# Patient Record
Sex: Male | Born: 1935 | Race: White | Hispanic: No | Marital: Married | Smoking: Never smoker
Health system: Southern US, Community
[De-identification: ages and names within clinical notes are randomized; demographics above are authoritative.]

## PROBLEM LIST (undated history)

## (undated) DIAGNOSIS — K219 Gastro-esophageal reflux disease without esophagitis: Secondary | ICD-10-CM

## (undated) DIAGNOSIS — M1990 Unspecified osteoarthritis, unspecified site: Secondary | ICD-10-CM

## (undated) DIAGNOSIS — N1330 Unspecified hydronephrosis: Secondary | ICD-10-CM

## (undated) DIAGNOSIS — R32 Unspecified urinary incontinence: Secondary | ICD-10-CM

## (undated) DIAGNOSIS — I10 Essential (primary) hypertension: Secondary | ICD-10-CM

## (undated) DIAGNOSIS — Z8619 Personal history of other infectious and parasitic diseases: Secondary | ICD-10-CM

## (undated) DIAGNOSIS — H9190 Unspecified hearing loss, unspecified ear: Secondary | ICD-10-CM

## (undated) HISTORY — PX: PROSTATE SURGERY: SHX751

## (undated) HISTORY — PX: TONSILLECTOMY: SUR1361

---

## 1957-11-12 HISTORY — PX: NOSE SURGERY: SHX723

## 2002-11-12 HISTORY — PX: PROSTATECTOMY: SHX69

## 2005-08-27 ENCOUNTER — Ambulatory Visit: Payer: Self-pay | Admitting: Internal Medicine

## 2007-05-28 ENCOUNTER — Ambulatory Visit: Payer: Self-pay | Admitting: Unknown Physician Specialty

## 2010-03-02 ENCOUNTER — Inpatient Hospital Stay: Payer: Self-pay | Admitting: Urology

## 2010-05-16 ENCOUNTER — Ambulatory Visit: Payer: Self-pay | Admitting: Urology

## 2012-08-29 ENCOUNTER — Ambulatory Visit: Payer: Self-pay | Admitting: Internal Medicine

## 2012-10-17 ENCOUNTER — Encounter (HOSPITAL_COMMUNITY): Payer: Self-pay | Admitting: Pharmacy Technician

## 2012-10-17 ENCOUNTER — Other Ambulatory Visit: Payer: Self-pay | Admitting: Urology

## 2012-10-20 ENCOUNTER — Encounter (HOSPITAL_COMMUNITY): Payer: Self-pay

## 2012-10-20 ENCOUNTER — Ambulatory Visit (HOSPITAL_COMMUNITY)
Admission: RE | Admit: 2012-10-20 | Discharge: 2012-10-20 | Disposition: A | Discharge status: Home / Self Care | Payer: Medicare Other | Source: Ambulatory Visit | Attending: Urology | Admitting: Urology

## 2012-10-20 ENCOUNTER — Encounter (HOSPITAL_COMMUNITY)
Admission: RE | Admit: 2012-10-20 | Discharge: 2012-10-20 | Disposition: A | Discharge status: Home / Self Care | Payer: Medicare Other | Source: Ambulatory Visit | Attending: Urology | Admitting: Urology

## 2012-10-20 DIAGNOSIS — K219 Gastro-esophageal reflux disease without esophagitis: Secondary | ICD-10-CM | POA: Insufficient documentation

## 2012-10-20 DIAGNOSIS — N1330 Unspecified hydronephrosis: Secondary | ICD-10-CM | POA: Insufficient documentation

## 2012-10-20 DIAGNOSIS — Z01812 Encounter for preprocedural laboratory examination: Secondary | ICD-10-CM | POA: Insufficient documentation

## 2012-10-20 DIAGNOSIS — I10 Essential (primary) hypertension: Secondary | ICD-10-CM | POA: Insufficient documentation

## 2012-10-20 DIAGNOSIS — Z01810 Encounter for preprocedural cardiovascular examination: Secondary | ICD-10-CM | POA: Insufficient documentation

## 2012-10-20 HISTORY — DX: Unspecified urinary incontinence: R32

## 2012-10-20 HISTORY — DX: Unspecified osteoarthritis, unspecified site: M19.90

## 2012-10-20 HISTORY — DX: Essential (primary) hypertension: I10

## 2012-10-20 HISTORY — DX: Unspecified hydronephrosis: N13.30

## 2012-10-20 HISTORY — DX: Personal history of other infectious and parasitic diseases: Z86.19

## 2012-10-20 HISTORY — DX: Unspecified hearing loss, unspecified ear: H91.90

## 2012-10-20 HISTORY — DX: Gastro-esophageal reflux disease without esophagitis: K21.9

## 2012-10-20 LAB — SURGICAL PCR SCREEN: MRSA, PCR: NEGATIVE

## 2012-10-20 NOTE — Progress Notes (Signed)
10/20/12 1301  OBSTRUCTIVE SLEEP APNEA  Have you ever been diagnosed with sleep apnea through a sleep study? No  Do you snore loudly (loud enough to be heard through closed doors)?  1  Do you often feel tired, fatigued, or sleepy during the daytime? 1  Has anyone observed you stop breathing during your sleep? 0  Do you have, or are you being treated for high blood pressure? 1  BMI more than 35 kg/m2? 0  Age over 76 years old? 1  Neck circumference greater than 40 cm/18 inches? 1  Gender: 1  Obstructive Sleep Apnea Score 6   Score 4 or greater  Results sent to PCP

## 2012-10-20 NOTE — Patient Instructions (Addendum)
Jeffrey Dennis  10/20/2012                           YOUR PROCEDURE IS SCHEDULED ON: 10/21/12               PLEASE REPORT TO SHORT STAY CENTER AT : 9:15 AM               CALL THIS NUMBER IF ANY PROBLEMS THE DAY OF SURGERY :               [phone]                      REMEMBER:   Do not eat food or drink liquids AFTER MIDNIGHT   Take these medicines the morning of surgery with A SIP OF WATER:  LABETOLOL / BACTRIM / TRAMADOL / RAPAFLO   Do not wear jewelry, make-up   Do not wear lotions, powders, or perfumes.   Do not shave legs or underarms 12 hrs. before surgery (men may shave face)  Do not bring valuables to the hospital.  Contacts, dentures or bridgework may not be worn into surgery.  Leave suitcase in the car. After surgery it may be brought to your room.  For patients admitted to the hospital more than one night, checkout time is 11:00                          The day of discharge.   Patients discharged the day of surgery will not be allowed to drive home                             If going home same day of surgery, must have someone stay with you first                           24 hrs at home and arrange for some one to drive you home from hospital.    Special Instructions:   Please read over the following fact sheets that you were given:               1. MRSA  INFORMATION                      2. [place] PREPARING FOR SURGERY SHEET                                                X_____________________________________________________________________                  FAILURE TO FOLLOW THESE INSTRUCTIONS MAY RESULT IN CANCELLATION OF SURGERY

## 2012-10-20 NOTE — H&P (Signed)
ctive Problems Problems  1. Benign Prostatic Hypertrophy With Urinary Obstruction 600.01 2. Chronic Renal Insufficiency 585.9 3. Hydronephrosis On The Left 591 4. Hydronephrosis On The Right 591 5. Incomplete Emptying Of Bladder 788.21 6. PSA,Elevated 790.93 7. Ureteral Stricture 593.3 8. Urinary Incontinence With Continuous Leakage 788.37 9. Urinary Tract Infection 599.0  History of Present Illness     [name] is an 78 yo WM sent in consultation by Dr. Bethann Punches for a possible bladder mass and bilateral hydronephrosis on a renal US on 08/29/12.  He had a bladder volume of over 1000cc.  He has a history of a distal urethral stricture and had that dilated by Dr. Lonna Cobb in 2011. He has been on Rapaflo and was given a catheter to try self dilation but hasn't done that in a while.  He had a UTI on 11/20 that has been treated with Keflex and his UA has improved.  He has seen Dr. Payton Mccallum in the past after he was found to have an elevated PSA to 7.4.  He had a negative biopsy by Dr. Artis Flock and then he had a saturation biopsy because of a PSA of 25 and that was negative in 2003-2004.  He subsequently had bleeding and had a TURP.  His repeat PSA's have been better.  He was told he had the largest prostate they had ever seen at [facility].  He has an abdominal US done in October because of recurrent UTI's. He has mild frequent incontinence that is worse with stress.  He is wearing 5 ppd.  He has no urgency and minimal urgency.  He has nocturia x 4.  He has a weak stream.  He is not sure if he empties well but has no excessive pressure. His Cr on 10/16 was mildly elevated at 1.9.   Past Medical History Problems  1. History of  Arthritis V13.4 2. History of  Gout 274.9 3. History of  Hepatitis 573.3 4. History of  Hypertension 401.9  Surgical History Problems  1. History of  Biopsy Of The Prostate Needle 2. History of  Transurethral Resection Of Prostate (TURP)  Current Meds 1.  Labetalol HCl 300 MG Oral Tablet; Therapy: (Recorded:05Dec2013) to 2. Rapaflo 8 MG Oral Capsule; Therapy: (Recorded:05Dec2013) to 3. TraMADol HCl 50 MG Oral Tablet; Therapy: (Recorded:05Dec2013) to  Allergies Medication  1. No Known Drug Allergies  Family History Problems  1. Family history of  Death In The Family Father 2. Family history of  Death In The Family Mother 3. Family history of  Family Health Status Number Of Children 4. Paternal history of  Stroke Syndrome V17.1 5. Maternal history of  Stroke Syndrome V17.1  Social History Problems    Marital History - Currently Married   Never A Smoker   Occupation:   Retired From Work Denied    History of  Alcohol Use   History of  Caffeine Use  Review of Systems Genitourinary, constitutional, skin, eye, otolaryngeal, hematologic/lymphatic, cardiovascular, pulmonary, endocrine, musculoskeletal, gastrointestinal, neurological and psychiatric system(s) were reviewed and pertinent findings if present are noted.  Genitourinary: urinary frequency, feelings of urinary urgency, nocturia, incontinence, difficulty starting the urinary stream, weak urinary stream and urinary stream starts and stops.  Gastrointestinal: diarrhea and constipation.  Constitutional: no fever.  Eyes: diplopia.  ENT: sinus problems.  Musculoskeletal: joint pain.    Vitals Vital Signs [Data Includes: Last 1 Day]  05Dec2013 02:35PM  Weight: 232 lb  Blood Pressure: 143 / 76 Temperature: 99 F Heart Rate:  77  Physical Exam Constitutional: Well nourished and well developed . No acute distress.  ENT:. The ears and nose are normal in appearance.  Neck: The appearance of the neck is normal and no neck mass is present.  Pulmonary: No respiratory distress and normal respiratory rhythm and effort.  Cardiovascular: Heart rate and rhythm are normal . No peripheral edema.  Abdomen: The abdomen is mildly obese. The abdomen is soft and nontender. No masses are  palpated. No CVA tenderness. No hernias are palpable. No hepatosplenomegaly noted.  Rectal: Rectal exam demonstrates normal sphincter tone, no tenderness and no masses. Estimated prostate size is 4+. The prostate has no nodularity and is not tender. The left seminal vesicle is nonpalpable. The right seminal vesicle is nonpalpable. The perineum is normal on inspection.  Genitourinary: Examination of the penis demonstrates meatal stenosis, but no discharge, no masses and no lesions. The penis is uncircumcised. The scrotum is without lesions. The right epididymis is palpably normal and non-tender. The left epididymis is palpably normal and non-tender. The right testis is non-tender and without masses. The left testis is non-tender and without masses.  Lymphatics: The femoral and inguinal nodes are not enlarged or tender.  Skin: Normal skin turgor, no visible rash and no visible skin lesions.  Neuro/Psych:. Mood and affect are appropriate.    Results/Data Urine [Data Includes: Last 1 Day]   05Dec2013  COLOR YELLOW   APPEARANCE CLOUDY   SPECIFIC GRAVITY 1.010   pH 6.0   GLUCOSE NEG mg/dL  BILIRUBIN NEG   KETONE NEG mg/dL  BLOOD MOD   PROTEIN NEG mg/dL  UROBILINOGEN 0.2 mg/dL  NITRITE POS   LEUKOCYTE ESTERASE MOD   SQUAMOUS EPITHELIAL/HPF NONE SEEN   WBC 21-50 WBC/hpf  RBC 0-2 RBC/hpf  BACTERIA RARE   CRYSTALS NONE SEEN   CASTS NONE SEEN    Old records or history reviewed: I have reviewed records from Dr. Hyacinth Meeker with the UA and renal US.  The following images/tracing/specimen were independently visualized:  Renal US today shows an 11.26cm right kidney with moderate to severe hydro and hydroureter. No stones or masses are noted. The left kidney is 11.27cm with moderate hydro and hydroureter. There are no stones or mass lesions. The bladder volume is 953cc with a rounded mass at the base of the bladder consistent with an intravesical prostatic middle lobe.  The following clinical lab reports  were reviewed:  Urine from 11/20 reviewed.  The following radiology reports were reviewed: Renal US report from 10/18 reviewed.    Assessment Assessed  1. Benign Prostatic Hypertrophy With Urinary Obstruction 600.01 2. Hydronephrosis On The Left 591 3. Hydronephrosis On The Right 591 4. Incomplete Emptying Of Bladder 788.21 5. Ureteral Stricture 593.3 6. Chronic Renal Insufficiency 585.9 7. Urinary Tract Infection 599.0 8. Urinary Incontinence With Continuous Leakage 788.37   He has recurrent UTI's with overflow incontinence and bilateral hydro with renal insufficiency from outlet obstruction either from his prostate or urethral stricture.   Plan  Hydronephrosis On The Left (591), Hydronephrosis On The Right (591)  1. RENAL U/S COMPLETE  Done: 05Dec2013 12:00AM Incomplete Emptying Of Bladder (788.21)  2. Get Outside Records Office  Follow-up  Requested for: 05Dec2013 Urinary Tract Infection (599.0)  3. BASIC METABOLIC PANEL  Done: 05Dec2013 4. CBC W/DIFF  Done: 05Dec2013      I am going to culture the urine today and check a BMP and CBC. He will start the sulfa antibiotic he was given by Dr. Hyacinth Meeker. I am  going to schedule him for next week for cystoscopy with probable urethral balloon dilation, bilateral retrograde pyelograms and possible TURP. I have reviewed the risks of bleeding, infection, recurrent stricture, worsening incontinence, need for additional procedures or prolonged foley drainage, thrombotic events and anesthetic complications  I am going to get his surgical records from [facility].   Discussion/Summary  CC: Dr. Bethann Punches.

## 2012-10-21 ENCOUNTER — Encounter (HOSPITAL_COMMUNITY)
Admission: RE | Disposition: A | Discharge status: Home Health | Payer: Self-pay | Source: Ambulatory Visit | Attending: Urology

## 2012-10-21 ENCOUNTER — Inpatient Hospital Stay (HOSPITAL_COMMUNITY)
Admission: RE | Admit: 2012-10-21 | Discharge: 2012-10-25 | DRG: 713 | Disposition: A | Discharge status: Home Health | Payer: Medicare Other | Source: Ambulatory Visit | Attending: Urology | Admitting: Urology

## 2012-10-21 ENCOUNTER — Encounter (HOSPITAL_COMMUNITY): Payer: Self-pay | Admitting: *Deleted

## 2012-10-21 ENCOUNTER — Ambulatory Visit (HOSPITAL_COMMUNITY): Payer: Medicare Other | Admitting: Anesthesiology

## 2012-10-21 ENCOUNTER — Encounter (HOSPITAL_COMMUNITY): Payer: Self-pay | Admitting: Anesthesiology

## 2012-10-21 DIAGNOSIS — D72829 Elevated white blood cell count, unspecified: Secondary | ICD-10-CM

## 2012-10-21 DIAGNOSIS — R339 Retention of urine, unspecified: Secondary | ICD-10-CM | POA: Diagnosis present

## 2012-10-21 DIAGNOSIS — N39490 Overflow incontinence: Secondary | ICD-10-CM | POA: Diagnosis present

## 2012-10-21 DIAGNOSIS — N390 Urinary tract infection, site not specified: Secondary | ICD-10-CM | POA: Diagnosis present

## 2012-10-21 DIAGNOSIS — N1330 Unspecified hydronephrosis: Secondary | ICD-10-CM | POA: Diagnosis present

## 2012-10-21 DIAGNOSIS — G9340 Encephalopathy, unspecified: Secondary | ICD-10-CM

## 2012-10-21 DIAGNOSIS — I129 Hypertensive chronic kidney disease with stage 1 through stage 4 chronic kidney disease, or unspecified chronic kidney disease: Secondary | ICD-10-CM | POA: Diagnosis present

## 2012-10-21 DIAGNOSIS — N138 Other obstructive and reflux uropathy: Principal | ICD-10-CM | POA: Diagnosis present

## 2012-10-21 DIAGNOSIS — IMO0002 Reserved for concepts with insufficient information to code with codable children: Secondary | ICD-10-CM | POA: Diagnosis present

## 2012-10-21 DIAGNOSIS — N179 Acute kidney failure, unspecified: Secondary | ICD-10-CM

## 2012-10-21 DIAGNOSIS — K219 Gastro-esophageal reflux disease without esophagitis: Secondary | ICD-10-CM | POA: Diagnosis present

## 2012-10-21 DIAGNOSIS — K209 Esophagitis, unspecified without bleeding: Secondary | ICD-10-CM | POA: Diagnosis present

## 2012-10-21 DIAGNOSIS — E871 Hypo-osmolality and hyponatremia: Secondary | ICD-10-CM | POA: Diagnosis present

## 2012-10-21 DIAGNOSIS — G9349 Other encephalopathy: Secondary | ICD-10-CM | POA: Diagnosis not present

## 2012-10-21 DIAGNOSIS — N189 Chronic kidney disease, unspecified: Secondary | ICD-10-CM | POA: Diagnosis present

## 2012-10-21 DIAGNOSIS — N401 Enlarged prostate with lower urinary tract symptoms: Principal | ICD-10-CM | POA: Diagnosis present

## 2012-10-21 DIAGNOSIS — K2970 Gastritis, unspecified, without bleeding: Secondary | ICD-10-CM

## 2012-10-21 DIAGNOSIS — K2990 Gastroduodenitis, unspecified, without bleeding: Secondary | ICD-10-CM | POA: Diagnosis present

## 2012-10-21 DIAGNOSIS — N39498 Other specified urinary incontinence: Secondary | ICD-10-CM | POA: Diagnosis present

## 2012-10-21 HISTORY — PX: TRANSURETHRAL RESECTION OF PROSTATE: SHX73

## 2012-10-21 HISTORY — PX: CYSTOSCOPY W/ RETROGRADES: SHX1426

## 2012-10-21 SURGERY — CYSTOSCOPY, WITH RETROGRADE PYELOGRAM
Anesthesia: General | Wound class: Clean Contaminated

## 2012-10-21 MED ORDER — ONDANSETRON HCL 4 MG/2ML IJ SOLN
4.0000 mg | INTRAMUSCULAR | Status: DC | PRN
  Administered 2012-10-23: 4 mg via INTRAVENOUS
  Filled 2012-10-21: qty 2

## 2012-10-21 MED ORDER — ONDANSETRON HCL 4 MG/2ML IJ SOLN
INTRAMUSCULAR | Status: DC | PRN
  Administered 2012-10-21 (×2): 2 mg via INTRAVENOUS

## 2012-10-21 MED ORDER — METOCLOPRAMIDE HCL 5 MG/ML IJ SOLN
INTRAMUSCULAR | Status: DC | PRN
  Administered 2012-10-21: 5 mg via INTRAVENOUS

## 2012-10-21 MED ORDER — SULFAMETHOXAZOLE-TMP DS 800-160 MG PO TABS
1.0000 | ORAL_TABLET | Freq: Two times a day (BID) | ORAL | Status: DC
  Administered 2012-10-21 – 2012-10-23 (×4): 1 via ORAL
  Filled 2012-10-21 (×5): qty 1

## 2012-10-21 MED ORDER — EPHEDRINE SULFATE 50 MG/ML IJ SOLN
INTRAMUSCULAR | Status: DC | PRN
  Administered 2012-10-21: 10 mg via INTRAVENOUS

## 2012-10-21 MED ORDER — LACTATED RINGERS IV SOLN
INTRAVENOUS | Status: DC | PRN
  Administered 2012-10-21 (×2): via INTRAVENOUS

## 2012-10-21 MED ORDER — HYDROMORPHONE HCL PF 1 MG/ML IJ SOLN: 0.5000 mg | INTRAMUSCULAR | Status: DC | PRN

## 2012-10-21 MED ORDER — ACETAMINOPHEN 10 MG/ML IV SOLN
INTRAVENOUS | Status: AC
  Filled 2012-10-21: qty 100

## 2012-10-21 MED ORDER — FENTANYL CITRATE 0.05 MG/ML IJ SOLN
INTRAMUSCULAR | Status: AC
  Filled 2012-10-21: qty 2

## 2012-10-21 MED ORDER — BELLADONNA ALKALOIDS-OPIUM 16.2-60 MG RE SUPP
1.0000 | Freq: Four times a day (QID) | RECTAL | Status: DC | PRN
  Administered 2012-10-21 – 2012-10-22 (×2): 1 via RECTAL
  Filled 2012-10-21 (×2): qty 1

## 2012-10-21 MED ORDER — PHENYLEPHRINE HCL 10 MG/ML IJ SOLN
INTRAMUSCULAR | Status: DC | PRN
  Administered 2012-10-21 (×3): 40 ug via INTRAVENOUS
  Administered 2012-10-21: 20 ug via INTRAVENOUS

## 2012-10-21 MED ORDER — LACTATED RINGERS IV SOLN: INTRAVENOUS | Status: DC

## 2012-10-21 MED ORDER — BELLADONNA ALKALOIDS-OPIUM 16.2-60 MG RE SUPP
RECTAL | Status: AC
  Filled 2012-10-21: qty 1

## 2012-10-21 MED ORDER — MIDAZOLAM HCL 5 MG/5ML IJ SOLN
INTRAMUSCULAR | Status: DC | PRN
  Administered 2012-10-21 (×2): 0.5 mg via INTRAVENOUS

## 2012-10-21 MED ORDER — PROPOFOL 10 MG/ML IV EMUL
INTRAVENOUS | Status: DC | PRN
  Administered 2012-10-21: 175 mg via INTRAVENOUS

## 2012-10-21 MED ORDER — IOHEXOL 300 MG/ML  SOLN
INTRAMUSCULAR | Status: DC | PRN
  Administered 2012-10-21: 28 mL

## 2012-10-21 MED ORDER — CEFAZOLIN SODIUM-DEXTROSE 2-3 GM-% IV SOLR
2.0000 g | INTRAVENOUS | Status: AC
  Administered 2012-10-21: 2 g via INTRAVENOUS

## 2012-10-21 MED ORDER — ACETAMINOPHEN 325 MG PO TABS: 650.0000 mg | ORAL_TABLET | ORAL | Status: DC | PRN

## 2012-10-21 MED ORDER — FENTANYL CITRATE 0.05 MG/ML IJ SOLN
INTRAMUSCULAR | Status: DC | PRN
  Administered 2012-10-21 (×3): 50 ug via INTRAVENOUS

## 2012-10-21 MED ORDER — LABETALOL HCL 300 MG PO TABS
150.0000 mg | ORAL_TABLET | Freq: Two times a day (BID) | ORAL | Status: DC
  Administered 2012-10-21 – 2012-10-25 (×8): 150 mg via ORAL
  Filled 2012-10-21 (×9): qty 0.5

## 2012-10-21 MED ORDER — ZOLPIDEM TARTRATE 5 MG PO TABS: 5.0000 mg | ORAL_TABLET | Freq: Every evening | ORAL | Status: DC | PRN

## 2012-10-21 MED ORDER — CEFAZOLIN SODIUM-DEXTROSE 2-3 GM-% IV SOLR
INTRAVENOUS | Status: AC
  Filled 2012-10-21: qty 50

## 2012-10-21 MED ORDER — IOHEXOL 300 MG/ML  SOLN
INTRAMUSCULAR | Status: AC
  Filled 2012-10-21: qty 1

## 2012-10-21 MED ORDER — SODIUM CHLORIDE 0.9 % IR SOLN
Status: DC | PRN
  Administered 2012-10-21: 1000 mL

## 2012-10-21 MED ORDER — EPHEDRINE SULFATE 50 MG/ML IJ SOLN
INTRAMUSCULAR | Status: DC | PRN
  Administered 2012-10-21 (×2): 5 mg via INTRAVENOUS
  Administered 2012-10-21: 10 mg via INTRAVENOUS
  Administered 2012-10-21 (×3): 5 mg via INTRAVENOUS
  Administered 2012-10-21: 10 mg via INTRAVENOUS
  Administered 2012-10-21: 5 mg via INTRAVENOUS
  Administered 2012-10-21: 10 mg via INTRAVENOUS

## 2012-10-21 MED ORDER — BISACODYL 10 MG RE SUPP: 10.0000 mg | Freq: Every day | RECTAL | Status: DC | PRN

## 2012-10-21 MED ORDER — KCL IN DEXTROSE-NACL 20-5-0.45 MEQ/L-%-% IV SOLN
INTRAVENOUS | Status: DC
  Administered 2012-10-21: 100 mL via INTRAVENOUS
  Administered 2012-10-22: 02:00:00 via INTRAVENOUS
  Filled 2012-10-21 (×4): qty 1000

## 2012-10-21 MED ORDER — BELLADONNA ALKALOIDS-OPIUM 16.2-60 MG RE SUPP
RECTAL | Status: DC | PRN
  Administered 2012-10-21: 1 via RECTAL

## 2012-10-21 MED ORDER — TRAMADOL HCL 50 MG PO TABS
50.0000 mg | ORAL_TABLET | Freq: Four times a day (QID) | ORAL | Status: DC | PRN
  Administered 2012-10-22: 50 mg via ORAL
  Filled 2012-10-21: qty 1

## 2012-10-21 MED ORDER — SODIUM CHLORIDE 0.9 % IR SOLN
3000.0000 mL | Status: DC
  Administered 2012-10-21 – 2012-10-22 (×2): 3000 mL

## 2012-10-21 MED ORDER — LACTATED RINGERS IV SOLN
INTRAVENOUS | Status: DC
  Administered 2012-10-21: 1000 mL via INTRAVENOUS

## 2012-10-21 MED ORDER — DEXTROSE 5 % IV SOLN
INTRAVENOUS | Status: DC | PRN
  Administered 2012-10-21: 12:00:00 via INTRAVENOUS

## 2012-10-21 MED ORDER — FENTANYL CITRATE 0.05 MG/ML IJ SOLN
25.0000 ug | INTRAMUSCULAR | Status: DC | PRN
  Administered 2012-10-21 (×2): 50 ug via INTRAVENOUS

## 2012-10-21 MED ORDER — SODIUM CHLORIDE 0.9 % IR SOLN
Status: DC | PRN
  Administered 2012-10-21: 24000 mL via INTRAVESICAL

## 2012-10-21 MED ORDER — LIDOCAINE HCL (CARDIAC) 20 MG/ML IV SOLN
INTRAVENOUS | Status: DC | PRN
  Administered 2012-10-21: 50 mg via INTRAVENOUS

## 2012-10-21 MED ORDER — DOCUSATE SODIUM 100 MG PO CAPS
100.0000 mg | ORAL_CAPSULE | Freq: Two times a day (BID) | ORAL | Status: DC
  Administered 2012-10-21 – 2012-10-25 (×8): 100 mg via ORAL
  Filled 2012-10-21 (×9): qty 1

## 2012-10-21 MED ORDER — PRAMIPEXOLE DIHYDROCHLORIDE 0.25 MG PO TABS
0.2500 mg | ORAL_TABLET | Freq: Every day | ORAL | Status: DC
  Administered 2012-10-21 – 2012-10-22 (×2): 0.25 mg via ORAL
  Filled 2012-10-21 (×3): qty 1

## 2012-10-21 MED ORDER — DEXTROSE 5 % IV SOLN
430.0000 mg | INTRAVENOUS | Status: AC
  Administered 2012-10-21: 430 mg via INTRAVENOUS
  Filled 2012-10-21: qty 10.75

## 2012-10-21 SURGICAL SUPPLY — 32 items
BAG URINE DRAINAGE (UROLOGICAL SUPPLIES) ×3 IMPLANT
BAG URO CATCHER STRL LF (DRAPE) ×3 IMPLANT
BALLN NEPHROSTOMY (BALLOONS) ×3
BALLOON NEPHROSTOMY (BALLOONS) ×2 IMPLANT
BLADE SURG 15 STRL LF DISP TIS (BLADE) IMPLANT
BLADE SURG 15 STRL SS (BLADE)
CATH FOLEY 3WAY 30CC 22FR (CATHETERS) ×3 IMPLANT
CATH URET 5FR 28IN OPEN ENDED (CATHETERS) IMPLANT
CLOTH BEACON ORANGE TIMEOUT ST (SAFETY) ×3 IMPLANT
DRAPE CAMERA CLOSED 9X96 (DRAPES) ×3 IMPLANT
ELECT BUTTON HF 24-28F 2 30DE (ELECTRODE) ×3 IMPLANT
ELECT LOOP MED HF 24F 12D CBL (CLIP) ×3 IMPLANT
ELECT REM PT RETURN 9FT ADLT (ELECTROSURGICAL)
ELECTRODE KNIFE URO 27FR PED (UROLOGICAL SUPPLIES) IMPLANT
ELECTRODE REM PT RTRN 9FT ADLT (ELECTROSURGICAL) IMPLANT
GLOVE SURG SS PI 8.0 STRL IVOR (GLOVE) ×3 IMPLANT
GOWN BRE IMP PREV XXLGXLNG (GOWN DISPOSABLE) ×3 IMPLANT
GOWN PREVENTION PLUS XLARGE (GOWN DISPOSABLE) ×3 IMPLANT
GOWN STRL REIN XL XLG (GOWN DISPOSABLE) ×3 IMPLANT
GUIDEWIRE STR DUAL SENSOR (WIRE) ×3 IMPLANT
HOLDER FOLEY CATH W/STRAP (MISCELLANEOUS) ×3 IMPLANT
KIT ASPIRATION TUBING (SET/KITS/TRAYS/PACK) ×6 IMPLANT
KNIFE COLLINS 24FR (ELECTROSURGICAL) IMPLANT
LOOPS CUTTING 24FR (ELECTROSURGICAL) IMPLANT
LOOPS RESECTOSCOPE DISP (ELECTROSURGICAL) ×3 IMPLANT
MANIFOLD NEPTUNE II (INSTRUMENTS) ×3 IMPLANT
PACK CYSTO (CUSTOM PROCEDURE TRAY) ×3 IMPLANT
ROLLER BALL 3MM 27FR (ELECTROSURGICAL) IMPLANT
SUT ETHILON 3 0 PS 1 (SUTURE) IMPLANT
SYR 30ML LL (SYRINGE) IMPLANT
SYRINGE IRR TOOMEY STRL 70CC (SYRINGE) ×3 IMPLANT
TUBING CONNECTING 10 (TUBING) ×3 IMPLANT

## 2012-10-21 NOTE — Interval H&P Note (Signed)
History and Physical Interval Note:  10/21/2012 11:17 AM  Jeffrey Dennis  has presented today for surgery, with the diagnosis of bilateral hydronephrosis and retention   The various methods of treatment have been discussed with the patient and family. After consideration of risks, benefits and other options for treatment, the patient has consented to  Procedure(s) (LRB) with comments: CYSTOSCOPY WITH RETROGRADE PYELOGRAM (Bilateral) - cystoscopy bilateral retrograde possible ureteral dilatation   TRANSURETHRAL RESECTION OF THE PROSTATE (TURP) (N/A) - Possible TURP as a surgical intervention .  The patient's history has been reviewed, patient examined, no change in status, stable for surgery.  I have reviewed the patient's chart and labs.  Questions were answered to the patient's satisfaction.     Wendell Nicoson J

## 2012-10-21 NOTE — Preoperative (Signed)
Beta Blockers   Reason not to administer Beta Blockers:Not Applicable pt. Took beta blocker this a.m. 

## 2012-10-21 NOTE — Brief Op Note (Signed)
10/21/2012  1:15 PM  PATIENT:  Jeffrey Dennis  78 y.o. male  PRE-OPERATIVE DIAGNOSIS:  bilateral hydronephrosis and retention   POST-OPERATIVE DIAGNOSIS:  bilateral hydronephrosis and retention   PROCEDURE:  Procedure(s) (LRB) with comments: CYSTOSCOPY WITH RETROGRADE PYELOGRAM (Bilateral) - cystoscopy bilateral retrograde  ureteral dilatation   TRANSURETHRAL RESECTION OF THE PROSTATE (TURP) (N/A)  SURGEON:  Surgeon(s) and Role:    * Anner Crete, MD - Primary  PHYSICIAN ASSISTANT:   ASSISTANTS: none   ANESTHESIA:   general  EBL:  Total I/O In: 1150 [I.V.:1150] Out: -   BLOOD ADMINISTERED:none  DRAINS: Urinary Catheter (Foley)   LOCAL MEDICATIONS USED:  NONE  SPECIMEN:  Source of Specimen:  prostate chips  DISPOSITION OF SPECIMEN:  N/A  COUNTS:  YES  TOURNIQUET:  * No tourniquets in log *  DICTATION: .Other Dictation: Dictation Number [id number]  PLAN OF CARE: Admit for overnight observation  PATIENT DISPOSITION:  PACU - hemodynamically stable.   Delay start of Pharmacological VTE agent (>24hrs) due to surgical blood loss or risk of bleeding: yes

## 2012-10-21 NOTE — Transfer of Care (Signed)
Immediate Anesthesia Transfer of Care Note  Patient: Jeffrey Dennis  Procedure(s) Performed: Procedure(s) (LRB) with comments: CYSTOSCOPY WITH RETROGRADE PYELOGRAM (Bilateral) - cystoscopy bilateral retrograde  ureteral dilatation   TRANSURETHRAL RESECTION OF THE PROSTATE (TURP) (N/A)  Patient Location: PACU  Anesthesia Type:General  Level of Consciousness: awake, alert , oriented and patient cooperative  Airway & Oxygen Therapy: Patient Spontanous Breathing and Patient connected to face mask oxygen  Post-op Assessment: Report given to PACU RN, Post -op Vital signs reviewed and stable and Patient moving all extremities  Post vital signs: Reviewed and stable  Complications: No apparent anesthesia complications

## 2012-10-21 NOTE — Anesthesia Preprocedure Evaluation (Addendum)
Anesthesia Evaluation  Patient identified by MRN, date of birth, ID band Patient awake    Reviewed: Allergy & Precautions, H&P , NPO status , Patient's Chart, lab work & pertinent test results, reviewed documented beta blocker date and time   Airway Mallampati: II TM Distance: >3 FB Neck ROM: full    Dental No notable dental hx. (+) Teeth Intact and Dental Advisory Given   Pulmonary neg pulmonary ROS,  breath sounds clear to auscultation  Pulmonary exam normal       Cardiovascular Exercise Tolerance: Good hypertension, Pt. on medications and Pt. on home beta blockers Rhythm:regular Rate:Normal  RBBB   Neuro/Psych negative neurological ROS  negative psych ROS   GI/Hepatic negative GI ROS, Neg liver ROS, GERD-  Controlled,  Endo/Other  negative endocrine ROS  Renal/GU negative Renal ROS  negative genitourinary   Musculoskeletal   Abdominal   Peds  Hematology negative hematology ROS (+)   Anesthesia Other Findings   Reproductive/Obstetrics negative OB ROS                          Anesthesia Physical Anesthesia Plan  ASA: II  Anesthesia Plan: General   Post-op Pain Management:    Induction: Intravenous  Airway Management Planned: LMA  Additional Equipment:   Intra-op Plan:   Post-operative Plan:   Informed Consent: I have reviewed the patients History and Physical, chart, labs and discussed the procedure including the risks, benefits and alternatives for the proposed anesthesia with the patient or authorized representative who has indicated his/her understanding and acceptance.   Dental Advisory Given  Plan Discussed with: CRNA and Surgeon  Anesthesia Plan Comments:         Anesthesia Quick Evaluation

## 2012-10-21 NOTE — Progress Notes (Signed)
Day of Surgery Subjective: Patient reports pain control good.  He is resting comfortably and telling jokes.  Objective: Vital signs in last 24 hours: Temp:  [97.8 F (36.6 C)-98.7 F (37.1 C)] 97.8 F (36.6 C) (12/10 1328) Pulse Rate:  [63-69] 63  (12/10 1415) Resp:  [14-20] 16  (12/10 1415) BP: (126-138)/(71-80) 126/75 mmHg (12/10 1415) SpO2:  [98 %-100 %] 100 % (12/10 1415) Weight:  [105.688 kg (233 lb)-105.7 kg (233 lb 0.4 oz)] 105.7 kg (233 lb 0.4 oz) (12/10 0907)  Intake/Output from previous day:   Intake/Output this shift: Total I/O In: 5400 [I.V.:1400; Other:4000] Out: 850 [Urine:850]  Physical Exam:  General:alert, cooperative and no distress Breaths sounds clear and unlabored GI: soft, non tender Foley: red urine with no clots   Lab Results:  Results for orders placed during the hospital encounter of 10/20/12  SURGICAL PCR SCREEN     Status: Normal   Collection Time   10/20/12  1:29 PM      Component Value Range Status Comment   MRSA, PCR NEGATIVE  NEGATIVE Final    Staphylococcus aureus NEGATIVE  NEGATIVE Final     Studies/Results: Dg Chest 2 View  10/20/2012  *RADIOLOGY REPORT*  Clinical Data: Preoperative respiratory films.  CHEST - 2 VIEW  Comparison: None.  Findings: Lungs are clear.  Heart size is normal.  No pneumothorax or pleural fluid.  Thoracic degenerative change noted.  IMPRESSION: No acute finding.   Original Report Authenticated By: Holley Dexter, M.D.     Assessment/Plan: Day of Surgery Procedure(s) (LRB): CYSTOSCOPY WITH RETROGRADE PYELOGRAM (Bilateral) TRANSURETHRAL RESECTION OF THE PROSTATE (TURP) (N/A)  Continue to monitor  Ambulate  I.S   LOS: 0 days   Silas Flood. 10/21/2012, 2:28 PM

## 2012-10-21 NOTE — Anesthesia Postprocedure Evaluation (Signed)
  Anesthesia Post-op Note  Patient: Jeffrey Dennis  Procedure(s) Performed: Procedure(s) (LRB): CYSTOSCOPY WITH RETROGRADE PYELOGRAM (Bilateral) TRANSURETHRAL RESECTION OF THE PROSTATE (TURP) (N/A)  Patient Location: PACU  Anesthesia Type: General  Level of Consciousness: awake and alert   Airway and Oxygen Therapy: Patient Spontanous Breathing  Post-op Pain: mild  Post-op Assessment: Post-op Vital signs reviewed, Patient's Cardiovascular Status Stable, Respiratory Function Stable, Patent Airway and No signs of Nausea or vomiting  Last Vitals:  Filed Vitals:   10/21/12 1345  BP: 133/80  Pulse: 66  Temp:   Resp: 14    Post-op Vital Signs: stable   Complications: No apparent anesthesia complications

## 2012-10-22 ENCOUNTER — Encounter (HOSPITAL_COMMUNITY): Payer: Self-pay | Admitting: Urology

## 2012-10-22 MED ORDER — ALUM & MAG HYDROXIDE-SIMETH 200-200-20 MG/5ML PO SUSP
40.0000 mL | Freq: Four times a day (QID) | ORAL | Status: DC | PRN
  Administered 2012-10-22 – 2012-10-24 (×2): 40 mL via ORAL
  Filled 2012-10-22: qty 30
  Filled 2012-10-22: qty 60

## 2012-10-22 NOTE — Progress Notes (Signed)
Patient ID: Jeffrey Dennis, male   DOB: Dec 08, 1929, 78 y.o.   MRN: [id number] 1 Day Post-Op  Subjective: Jeffrey Dennis is doing well post TURP and his urine is clear on slow CBI.   He has had some foley discomfort that is controlled with B&O suppositories. ROS: Negative except as above  Objective: Vital signs in last 24 hours: Temp:  [97.4 F (36.3 C)-98.9 F (37.2 C)] 98.9 F (37.2 C) (12/11 0631) Pulse Rate:  [59-80] 80  (12/11 0631) Resp:  [13-20] 16  (12/11 0631) BP: (119-157)/(58-85) 157/85 mmHg (12/11 0631) SpO2:  [96 %-100 %] 100 % (12/11 0631) Weight:  [105.144 kg (231 lb 12.8 oz)-105.7 kg (233 lb 0.4 oz)] 105.144 kg (231 lb 12.8 oz) (12/10 1518)  Intake/Output from previous day: 12/10 0701 - 12/11 0700 In: 21181.7 [P.O.:960; I.V.:3421.7] Out: 40981 [Urine:15550] Intake/Output this shift: Total I/O In: 8100 [I.V.:1200; Other:6900] Out: 7325 [Urine:7325]  General appearance: alert, no distress and his urine is clear.   Lab Results:  No results found for this basename: WBC:2,HGB:2,HCT:2,PLT:2 in the last 72 hours BMET No results found for this basename: NA:2,K:2,CL:2,CO2:2,GLUCOSE:2,BUN:2,CREATININE:2,CALCIUM:2 in the last 72 hours PT/INR No results found for this basename: LABPROT:2,INR:2 in the last 72 hours ABG No results found for this basename: PHART:2,PCO2:2,PO2:2,HCO3:2 in the last 72 hours  Studies/Results: Dg Chest 2 View  10/20/2012  *RADIOLOGY REPORT*  Clinical Data: Preoperative respiratory films.  CHEST - 2 VIEW  Comparison: None.  Findings: Lungs are clear.  Heart size is normal.  No pneumothorax or pleural fluid.  Thoracic degenerative change noted.  IMPRESSION: No acute finding.   Original Report Authenticated By: Holley Dexter, M.D.     Anti-infectives: Anti-infectives     Start     Dose/Rate Route Frequency Ordered Stop   10/21/12 2200  sulfamethoxazole-trimethoprim (BACTRIM DS) 800-160 MG per tablet 1 tablet       1 tablet Oral 2 times daily  10/21/12 1528     10/21/12 0909   gentamicin (GARAMYCIN) 430 mg in dextrose 5 % 100 mL IVPB        430 mg 110.8 mL/hr over 60 Minutes Intravenous 60 min pre-op 10/21/12 0909 10/21/12 1145   10/21/12 0909   ceFAZolin (ANCEF) IVPB 2 g/50 mL premix        2 g 100 mL/hr over 30 Minutes Intravenous 30 min pre-op 10/21/12 1914 10/21/12 1130          Current Facility-Administered Medications  Medication Dose Route Frequency Provider Last Rate Last Dose  . acetaminophen (TYLENOL) tablet 650 mg  650 mg Oral Q4H PRN Anner Crete, MD      . bisacodyl (DULCOLAX) suppository 10 mg  10 mg Rectal Daily PRN Anner Crete, MD      . [address] ceFAZolin (ANCEF) IVPB 2 g/50 mL premix  2 g Intravenous 30 min Pre-Op Anner Crete, MD   2 g at 10/21/12 1130  . dextrose 5 % and 0.45 % NaCl with KCl 20 mEq/L infusion   Intravenous Continuous Anner Crete, MD 100 mL/hr at 10/22/12 0150    . docusate sodium (COLACE) capsule 100 mg  100 mg Oral BID Anner Crete, MD   100 mg at 10/21/12 2106  . [EXPIRED] fentaNYL (SUBLIMAZE) 0.05 MG/ML injection           . [COMPLETED] gentamicin (GARAMYCIN) 430 mg in dextrose 5 % 100 mL IVPB  430 mg Intravenous 60 min Pre-Op Gwen Her, PHARMD   430 mg at  10/21/12 1145  . HYDROmorphone (DILAUDID) injection 0.5-1 mg  0.5-1 mg Intravenous Q2H PRN Anner Crete, MD      . labetalol (NORMODYNE) tablet 150 mg  150 mg Oral BID Anner Crete, MD   150 mg at 10/21/12 2105  . ondansetron (ZOFRAN) injection 4 mg  4 mg Intravenous Q4H PRN Anner Crete, MD      . opium-belladonna (B&O SUPPRETTES) suppository 1 suppository  1 suppository Rectal Q6H PRN Anner Crete, MD   1 suppository at 10/21/12 1834  . pramipexole (MIRAPEX) tablet 0.25 mg  0.25 mg Oral QHS Anner Crete, MD   0.25 mg at 10/21/12 2106  . sodium chloride irrigation 0.9 % 3,000 mL  3,000 mL Irrigation Continuous Anner Crete, MD   3,000 mL at 10/22/12 0150  . sulfamethoxazole-trimethoprim (BACTRIM DS) 800-160 MG per tablet  1 tablet  1 tablet Oral BID Anner Crete, MD   1 tablet at 10/21/12 2106  . traMADol (ULTRAM) tablet 50 mg  50 mg Oral Q6H PRN Anner Crete, MD      . zolpidem [facility]) tablet 5 mg  5 mg Oral QHS PRN Anner Crete, MD      . [DISCONTINUED] fentaNYL (SUBLIMAZE) injection 25-50 mcg  25-50 mcg Intravenous Q5 min PRN Gaetano Hawthorne, MD   50 mcg at 10/21/12 1413  . [DISCONTINUED] iohexol (OMNIPAQUE) 300 MG/ML solution    PRN Anner Crete, MD   28 mL at 10/21/12 1224  . [DISCONTINUED] lactated ringers infusion   Intravenous Continuous Gaetano Hawthorne, MD      . [DISCONTINUED] lactated ringers infusion   Intravenous Continuous Gaetano Hawthorne, MD 125 mL/hr at 10/21/12 1114 1,000 mL at 10/21/12 1114  . [DISCONTINUED] opium-belladonna (B&O SUPPRETTES) suppository    PRN Anner Crete, MD   1 suppository at 10/21/12 1305  . [DISCONTINUED] sodium chloride irrigation 0.9 %    PRN Anner Crete, MD   1,000 mL at 10/21/12 1215  . [DISCONTINUED] sodium chloride irrigation 0.9 %    PRN Anner Crete, MD   24,000 mL at 10/21/12 1218   Facility-Administered Medications Ordered in Other Encounters  Medication Dose Route Frequency Provider Last Rate Last Dose  . [DISCONTINUED] dextrose 5 % solution    Continuous PRN Edison Pace, CRNA      . [DISCONTINUED] ePHEDrine injection    PRN Edison Pace, CRNA   5 mg at 10/21/12 1230  . [DISCONTINUED] ePHEDrine injection    PRN Edison Pace, CRNA   10 mg at 10/21/12 1217  . [DISCONTINUED] fentaNYL (SUBLIMAZE) injection    PRN Edison Pace, CRNA   50 mcg at 10/21/12 1315  . [DISCONTINUED] lactated ringers infusion    Continuous PRN Edison Pace, CRNA      . [DISCONTINUED] lidocaine (cardiac) 100 mg/58ml (XYLOCAINE) 20 MG/ML injection 2%    PRN Edison Pace, CRNA   50 mg at 10/21/12 1130  . [DISCONTINUED] metoCLOPramide (REGLAN) injection    PRN Edison Pace, CRNA   5 mg at 10/21/12 1130  . [DISCONTINUED] midazolam (VERSED) 5 MG/5ML injection    PRN Edison Pace, CRNA    0.5 mg at 10/21/12 1130  . [DISCONTINUED] ondansetron (ZOFRAN) injection    PRN Edison Pace, CRNA   2 mg at 10/21/12 1130  . [DISCONTINUED] phenylephrine (NEO-SYNEPHRINE) injection    PRN Edison Pace, CRNA   40 mcg at 10/21/12  1300  . [DISCONTINUED] propofol (DIPRIVAN) 10 mg/ml infusion    PRN Edison Pace, CRNA   175 mg at 10/21/12 1145    Assessment: s/p Procedure(s): CYSTOSCOPY WITH RETROGRADE PYELOGRAM TRANSURETHRAL RESECTION OF THE PROSTATE (TURP)  He is doing well postop.   Plan: I will d/c the CBI and Heplock his IV. Because of the size of the prostate, I will keep the foley for another day.    LOS: 1 day    Satchel Heidinger J 10/22/2012

## 2012-10-22 NOTE — Care Management Note (Unsigned)
    Page 1 of 1   10/23/2012     3:26:26 PM   CARE MANAGEMENT NOTE 10/23/2012  Patient:  Jeffrey Dennis   Account Number:  [id number]  Date Initiated:  10/22/2012  Documentation initiated by:  Lanier Clam  Subjective/Objective Assessment:   ADMITTED W/BPH,HYDRONEPHROSIS,URINARY RETENTION.     Action/Plan:   FROM HOME   Anticipated DC Date:  10/25/2012   Anticipated DC Plan:  HOME W HOME HEALTH SERVICES      DC Planning Services  CM consult      Choice offered to / List presented to:             Status of service:  In process, will continue to follow Medicare Important Message given?   (If response is "NO", the following Medicare IM given date fields will be blank) Date Medicare IM given:   Date Additional Medicare IM given:    Discharge Disposition:    Per UR Regulation:  Reviewed for med. necessity/level of care/duration of stay  If discussed at Long Length of Stay Meetings, dates discussed:    Comments:  10/23/12 Arik Husmann RN,BSN NCM [phone] MED CONS-AMS,ACUTE ENCEPHALOPATHY,HYPONATREMIA,LEUKOCYTOSIS.RECOMMEND PT/OT CONS WHEN MD FEELS APPROPRIATE.  10/22/12 Kanijah Groseclose RN,BSN NCM [phone] POD#1 TURP,F/C.

## 2012-10-22 NOTE — Progress Notes (Signed)
Pt c/o indigestion. Called MD on call awaiting call back.

## 2012-10-22 NOTE — Op Note (Signed)
NAME:  Jeffrey Dennis, Jeffrey Dennis NO.:  [id number]  MEDICAL RECORD NO.:  [id number]  LOCATION:  1410                         FACILITY:  [facility]  PHYSICIAN:  Excell Seltzer. Annabell Howells, M.D.    DATE OF BIRTH:  1930/06/17  DATE OF PROCEDURE:  10/21/2012 DATE OF DISCHARGE:                              OPERATIVE REPORT   PROCEDURE:  Cystoscopy, urethral balloon dilation, bilateral retrograde pyelography with interpretation, transurethral resection of the prostate.  PREOPERATIVE DIAGNOSIS:  Chronic urinary retention, bilateral hydronephrosis, and flow incontinence with urethral stricture disease.  POSTOPERATIVE DIAGNOSES:  Chronic urinary retention, bilateral hydronephrosis, and flow incontinence with urethral stricture disease.  SURGEON:  Excell Seltzer. Annabell Howells, MD  ANESTHESIA:  General.  SPECIMEN:  Prostate chips.  DRAINS:  A 22-French, 3-way Foley catheter.  ESTIMATED BLOOD LOSS:  500 mL.  COMPLICATIONS:  None.  INDICATIONS:  Jeffrey Dennis is an 78 year old white male with a history of BPH with bladder outlet obstruction, who was recently found to have hydronephrosis and a residual urine of 900 mL.  Despite that his creatinine was normal.  His history was significant for a prior TURP with Dr. Lurlean Nanny at [facility] in around 2005, at which time, he was told he had 1 of the largest prostate they had ever seen at [facility]. On his recent ultrasound, he was noted to have a mass effect at the base the bladder and was referred for further evaluation of that.  On my office evaluation, I was unable to perform cystoscopy due to a distal stricture and it was felt based on the hydronephrosis a 900 mL residual and other findings that he needed to undergo a diagnostic procedure with probable urethral dilation, possible TURP and bilateral retrograde pyelograms.  He had a preoperative culture that grew multiple species. He had sulfa on hand which he continues to take.  FINDINGS ON PROCEDURE:  He was  given Cipro.  He was taken to the operating room, where general anesthetic was induced and he was placed in lithotomy position.  His perineum and genitalia were prepped with Betadine solution.  He was draped in the usual sterile fashion. Attempted cystoscopy was performed with a 22-French scope, but was unable to get  by the meatus.  At this time, a 5-French open-end catheter was passed per urethra and contrast was instilled.  This revealed what was felt to be some narrowing of the distal urethra but no other stricturing.  A guidewire was then passed through the open-end catheter into the bladder to confirm the placement of the bladder, the opening catheter was advanced. The wire was removed and a drip was noted.  The wire was replaced, the opening catheter was removed.  A 24-French 15-cm balloon was then passed over the wire, across the urethral meatus  into the urethra.  The balloon was dilated to 20 atmospheres using dilute contrast, this was left for 2 minutes and then deflated.  Bilateral retrograde pyelograms were performed.  The left ureteral orifice was cannulated with a 5-French open-end catheter and contrast was instilled.  This revealed marked J hooking of the distal ureter with some proximal tortuosity and dilation.  I was unable to get contrast  to the kidney, but the level of obstruction appears to be at the bladder wall.  A right retrograde pyelogram was performed in identical fashion once again with marked 8-J hooking dilation and tortuosity of the distal ureters, the proximal ureter and kidney was not readily filled, but no filling defects were noted and the level of obstruction appeared to be the bladder as expected.  At this point, a 22-French scope could be passed per urethra. Inspection revealed no real additional strictures beyond the meatus. The external sphincter was intact.  The prostatic urethra was quite elongated approximately 6-7 cm in length with  bilobar hyperplasia with obstruction.  There was an adhesion at the left apex with nodular regrowth that appeared to have been related to a prior obstruction. There was evidence of a TUR defect proximally in the prostate, but the right lateral lobe appeared fairly intact, the left lateral lobe appeared to have some evidence of prior resection with regrowth.  Inspection of the bladder revealed the ureteral orifices well away from the bladder neck.  The bladder wall had moderate trabeculation.  No tumors, stones, or inflammation were noted.  No mass effect noted on ultrasound appeared to be the prostate.  At this point, the urethra was dilated to 30-French with Sissy Hoff sounds.  The 28-French continuous-flow resectoscope sheath was inserted. This was fitted with an [place] handle with a 12-degree lens and a bipolar loop saline was used as the irrigant.  The left lobe of the prostate was then resected from bladder neck out to apex to create an adequate channel.  Due to the bulk of the prostate, I do not believe I got near the capsule with the resection.  However, wide channel was created on the left.  I then resected the right lobe of the prostate, where there was significantly more bulk.  The tissue more proximally anteriorly had a more fleshy appearance to a non-cutting, it is difficult to know if this is cancerous or BPH.  The more distal tissue had more traditional fluffy BPH-type appearance.  Additionally, the floor of the prostate was resected out to alongside the verumontanum.  The chips were evacuated and the bladder deflated.  Additional resection was performed on the right at the floor and at both apices.  At this point, the bladder was evacuated free of the bulk of the chips and the loop was exchanged for a button electrode.  The button was then used to fulgurate bleeders to ensure a dry resection bed.  It was also used to vaporize some residual tissue.  Once hemostasis  was achieved, inspection revealed a few residual chips which were removed and final inspection revealed no significant bleeding, and no residual chips.  The ureteral orifices were intact as was the external sphincter.  With the bladder full and excellent TUR defect was noted.  At this point, the scope was removed and pressure on the bladder produced a good stream.  A 22-French 3-way Foley catheter was inserted with the aid of a catheter guide.  The balloon was filled with 30 mL of sterile fluid and the catheter was irrigated with clear return.  The catheter was placed to continuous irrigation and straight drainage. The patient was taken down from lithotomy position.  His anesthetic was reversed.  He was moved to recovery room in stable condition.  There were no complications.     Excell Seltzer. Annabell Howells, M.D.     JJW/MEDQ  D:  10/21/2012  T:  10/22/2012  Job:  [id number] 

## 2012-10-23 ENCOUNTER — Inpatient Hospital Stay (HOSPITAL_COMMUNITY): Payer: Medicare Other

## 2012-10-23 DIAGNOSIS — N179 Acute kidney failure, unspecified: Secondary | ICD-10-CM

## 2012-10-23 DIAGNOSIS — D72829 Elevated white blood cell count, unspecified: Secondary | ICD-10-CM

## 2012-10-23 DIAGNOSIS — G9340 Encephalopathy, unspecified: Secondary | ICD-10-CM

## 2012-10-23 DIAGNOSIS — N189 Chronic kidney disease, unspecified: Secondary | ICD-10-CM

## 2012-10-23 LAB — URINALYSIS, ROUTINE W REFLEX MICROSCOPIC
Nitrite: NEGATIVE
Specific Gravity, Urine: 1.024 (ref 1.005–1.030)
Urobilinogen, UA: 0.2 mg/dL (ref 0.0–1.0)
pH: 6.5 (ref 5.0–8.0)

## 2012-10-23 LAB — COMPREHENSIVE METABOLIC PANEL
Albumin: 3.2 g/dL — ABNORMAL LOW (ref 3.5–5.2)
BUN: 21 mg/dL (ref 6–23)
Calcium: 9.5 mg/dL (ref 8.4–10.5)
GFR calc Af Amer: 32 mL/min — ABNORMAL LOW (ref >90)
Glucose, Bld: 126 mg/dL — ABNORMAL HIGH (ref 70–99)
Potassium: 4 mEq/L (ref 3.5–5.1)
Sodium: 132 mEq/L — ABNORMAL LOW (ref 135–145)
Total Protein: 6.3 g/dL (ref 6.0–8.3)

## 2012-10-23 LAB — BLOOD GAS, ARTERIAL
Bicarbonate: 25.4 mEq/L — ABNORMAL HIGH (ref 20.0–24.0)
Drawn by: 307971
FIO2: 0.21 %
pCO2 arterial: 41 mmHg (ref 35.0–45.0)
pO2, Arterial: 69.3 mmHg — ABNORMAL LOW (ref 80.0–100.0)

## 2012-10-23 LAB — CBC
Hemoglobin: 11.5 g/dL — ABNORMAL LOW (ref 13.0–17.0)
MCH: 30.7 pg (ref 26.0–34.0)
MCHC: 35 g/dL (ref 30.0–36.0)
RDW: 14.1 % (ref 11.5–15.5)

## 2012-10-23 LAB — URINE MICROSCOPIC-ADD ON

## 2012-10-23 MED ORDER — ACETAMINOPHEN 10 MG/ML IV SOLN
1000.0000 mg | Freq: Once | INTRAVENOUS | Status: AC
Start: 2012-10-23 — End: 2012-10-23
  Administered 2012-10-23: 1000 mg via INTRAVENOUS
  Filled 2012-10-23: qty 100

## 2012-10-23 MED ORDER — BISACODYL 10 MG RE SUPP
10.0000 mg | Freq: Once | RECTAL | Status: AC
  Administered 2012-10-23: 10 mg via RECTAL
  Filled 2012-10-23: qty 1

## 2012-10-23 MED ORDER — DEXTROSE 5 % IV SOLN
1.0000 g | INTRAVENOUS | Status: DC
  Administered 2012-10-23 – 2012-10-24 (×2): 1 g via INTRAVENOUS
  Filled 2012-10-23 (×3): qty 10

## 2012-10-23 MED ORDER — PROMETHAZINE HCL 25 MG/ML IJ SOLN
12.5000 mg | Freq: Once | INTRAMUSCULAR | Status: AC
  Administered 2012-10-23: 12.5 mg via INTRAVENOUS
  Filled 2012-10-23: qty 1

## 2012-10-23 MED ORDER — SODIUM CHLORIDE 0.9 % IV SOLN: 12.5000 mg | Freq: Once | INTRAVENOUS | Status: DC

## 2012-10-23 MED ORDER — SODIUM CHLORIDE 0.9 % IV SOLN
INTRAVENOUS | Status: DC
  Administered 2012-10-23 – 2012-10-24 (×3): via INTRAVENOUS

## 2012-10-23 MED ORDER — KCL IN DEXTROSE-NACL 20-5-0.45 MEQ/L-%-% IV SOLN
INTRAVENOUS | Status: DC
  Administered 2012-10-23: 13:00:00 via INTRAVENOUS
  Filled 2012-10-23 (×2): qty 1000

## 2012-10-23 NOTE — Consult Note (Signed)
[facility] Medical Consultation  Jeffrey Dennis ZOX:[id number] DOB: May 18, 1930 DOA: 10/21/2012 PCP: Danella Penton., MD   Requesting physician: Dr Wilson Singer. Date of consultation: 10-23-2012. Reason for consultation: AMS.  Impression/Recommendations Active Problems:  Acute on chronic renal failure  Encephalopathy acute  Leukocytosis    1. Encephalopathy: This could be secondary to medications like pramipexole, phenergan. He has not received significant doses of opioids. Will rule out infectious process. Will order UA, Chest x ray. I will stop Pramipexole. His neuro exam was non focal. Will order ABG to check for hypercapnia. Also in the differential metabolic encephalopathy. LFT normal.  2. Acute on Chronic renal failure. Per admission HPI last Cr was at 1.9. Agree with IV fluids. Will order UA, urine Na, Cr. Agree with holding bactrim. 3. Mild hyponatremia: Likely secondary to decrease volume. IV fluids change to NS.  4. Mild Leukocytosis. Will do work up rule out infection. Check UA, Chest x ray.   I will followup again tomorrow. Please contact me if I can be of assistance in the meanwhile. Thank you for this consultation.  Chief Complaint: AMS.  HPI:  78 year old with PMH significant for Chronic renal insufficiency Cr per records at 1.9, Benign Prostatic Hypertrophy With Urinary Obstruction, Bilateral Hydronephrosis, Ureteral Stricture Admitted, HTN, who was admitted 12-10 for  Cystoscopy, urethral balloon dilation, bilateral retrograde  pyelography with interpretation, transurethral resection of the prostate. Patient was notice to be more somnolent today. His wife is at bedside and relates this is first time something like this happen to him. Patient received a dose of phenergan, but he hasn't received opioids today. He has had poor oral intake. He open eyes to voice, able to follow some command. He denies chest pain, dyspnea, headache.      Review of Systems:  Limited due to  patient AMS. He was able to denies Chest pain, abdominal pain, headache, dyspnea.   Past Medical History  Diagnosis Date  . Hypertension   . Hearing decreased   . Arthritis   . GERD (gastroesophageal reflux disease)   . History of hepatitis A   . Bilateral hydronephrosis   . Incontinence of urine    Past Surgical History  Procedure Date  . Prostatectomy 2004  . Prostate surgery   . Tonsillectomy   . Nose surgery 1959  . Cystoscopy w/ retrogrades 10/21/2012    Procedure: CYSTOSCOPY WITH RETROGRADE PYELOGRAM;  Surgeon: Anner Crete, MD;  Location: [facility];  Service: Urology;  Laterality: Bilateral;  cystoscopy bilateral retrograde  ureteral dilatation    . Transurethral resection of prostate 10/21/2012    Procedure: TRANSURETHRAL RESECTION OF THE PROSTATE (TURP);  Surgeon: Anner Crete, MD;  Location: [facility];  Service: Urology;  Laterality: N/A;   Social History:  reports that he has never smoked. He does not have any smokeless tobacco history on file. He reports that he does not drink alcohol or use illicit drugs.  No Known Allergies History reviewed. No pertinent family history.  Prior to Admission medications   Medication Sig Start Date End Date Taking? Authorizing Provider  labetalol (NORMODYNE) 300 MG tablet Take 150 mg by mouth 2 (two) times daily.   Yes Historical Provider, MD  pramipexole (MIRAPEX) 0.5 MG tablet Take 0.25 mg by mouth as needed. PT TAKES AS NEEDED FOR RESTLESS LEG SYNDROME   Yes Historical Provider, MD  sodium chloride (AYR) 0.65 % nasal spray Place 1 spray into the nose daily as needed. Allergies   Yes Historical Provider, MD  sulfamethoxazole-trimethoprim (BACTRIM DS) 800-160 MG per tablet Take 1 tablet by mouth 2 (two) times daily.   Yes Historical Provider, MD  traMADol (ULTRAM) 50 MG tablet Take 50 mg by mouth every 6 (six) hours as needed. Pain   Yes Historical Provider, MD   Physical Exam: Blood pressure 126/71, pulse 85, temperature 98.6 F (37 C),  temperature source Oral, resp. rate 17, height 5\' 10"  (1.778 m), weight 105.144 kg (231 lb 12.8 oz), SpO2 97.00%. Filed Vitals:   10/22/12 1513 10/22/12 2230 10/23/12 0556 10/23/12 1300  BP: 124/68 144/70 138/73 126/71  Pulse: 79 79 88 85  Temp: 98.4 F (36.9 C) 98.4 F (36.9 C) 98.3 F (36.8 C) 98.6 F (37 C)  TempSrc: Oral Oral Oral Oral  Resp: 16 16 16 17   Height:      Weight:      SpO2: 97% 96% 96% 97%   General Appearance:    Somnolent, arousable, no distress, appears stated age  Head:    Normocephalic, without obvious abnormality, atraumatic  Eyes:    PERRL, conjunctiva/corneas clear, EOM's intact,   Ears:    Normal TM's and external ear canals, both ears  Nose:   Nares normal, septum midline, mucosa normal, no drainage    or sinus tenderness  Throat:   Lips, mucosa, and tongue dry; teeth and gums normal  Neck:   Supple, symmetrical, trachea midline, no adenopathy;       thyroid:  No enlargement/tenderness/nodules; no carotid   bruit or JVD  Back:     Symmetric, no curvature, ROM normal, no CVA tenderness  Lungs:     Clear to auscultation bilaterally, respirations unlabored  Chest wall:    No tenderness or deformity  Heart:    Regular rate and rhythm, S1 and S2 normal, no murmur, rub   or gallop  Abdomen:     Soft, non-tender, bowel sounds active all four quadrants,    no masses, no organomegaly        Extremities:   Extremities normal, atraumatic, no cyanosis or edema  Pulses:   2+ and symmetric all extremities  Skin:   Skin color, texture, turgor normal, no rashes or lesions  Lymph nodes:   Cervical, supraclavicular, and axillary nodes normal  Neurologic:   CNII-XII intact. Following command, non focal neuro exam.     Labs on Admission:  Basic Metabolic Panel:  Lab 10/23/12 1610  NA 132*  K 4.0  CL 95*  CO2 25  GLUCOSE 126*  BUN 21  CREATININE 2.12*  CALCIUM 9.5  MG --  PHOS --   Liver Function Tests:  Lab 10/23/12 1245  AST 16  ALT 10  ALKPHOS 96   BILITOT 0.6  PROT 6.3  ALBUMIN 3.2*  CBC:  Lab 10/23/12 1245  WBC 12.8*  NEUTROABS --  HGB 11.5*  HCT 32.9*  MCV 88.0  PLT 184   Cardiac Enzymes: No results found for this basename: CKTOTAL:5,CKMB:5,CKMBINDEX:5,TROPONINI:5 in the last 168 hours BNP: No components found with this basename: POCBNP:5 CBG: No results found for this basename: GLUCAP:5 in the last 168 hours  Radiological Exams on Admission: No results found.  EKG: Will Ordered EKG.   Time spent: more than 60 minutes.   Iva Posten [facility] Pager [phone]  If 7PM-7AM, please contact night-coverage [email or web address] Password [facility] 10/23/2012, 2:14 PM

## 2012-10-23 NOTE — Progress Notes (Signed)
Patient ID: Jeffrey Dennis, male   DOB: September 01, 1930, 78 y.o.   MRN: [id number] 2 Days Post-Op  Subjective: [name] is POD 2 post TURP.   He is having nausea and has pain in his knees.   He has voided a small amount x 1.  He has had similar knee pain in September that was orginally felt to be gout and then determined not to be. ROS: Negative except as above and he has not had a BM.   Objective: Vital signs in last 24 hours: Temp:  [98.3 F (36.8 C)-98.4 F (36.9 C)] 98.3 F (36.8 C) (12/12 0556) Pulse Rate:  [79-88] 88  (12/12 0556) Resp:  [16] 16  (12/12 0556) BP: (124-144)/(65-73) 138/73 mmHg (12/12 0556) SpO2:  [96 %-97 %] 96 % (12/12 0556)  Intake/Output from previous day: 12/11 0701 - 12/12 0700 In: 4160 [P.O.:960; I.V.:200] Out: 2925 [Urine:2925] Intake/Output this shift:    General appearance: alert, no distress and slightly ill appearing.  Resp: clear to auscultation bilaterally Cardio: regular rate and rhythm GI: Soft, non-tender with positive BS.   Lab Results:  No results found for this basename: WBC:2,HGB:2,HCT:2,PLT:2 in the last 72 hours BMET No results found for this basename: NA:2,K:2,CL:2,CO2:2,GLUCOSE:2,BUN:2,CREATININE:2,CALCIUM:2 in the last 72 hours PT/INR No results found for this basename: LABPROT:2,INR:2 in the last 72 hours ABG No results found for this basename: PHART:2,PCO2:2,PO2:2,HCO3:2 in the last 72 hours  Studies/Results: No results found.  Anti-infectives: Anti-infectives     Start     Dose/Rate Route Frequency Ordered Stop   10/21/12 2200  sulfamethoxazole-trimethoprim (BACTRIM DS) 800-160 MG per tablet 1 tablet       1 tablet Oral 2 times daily 10/21/12 1528     10/21/12 0909   gentamicin (GARAMYCIN) 430 mg in dextrose 5 % 100 mL IVPB        430 mg 110.8 mL/hr over 60 Minutes Intravenous 60 min pre-op 10/21/12 0909 10/21/12 1145   10/21/12 0909   ceFAZolin (ANCEF) IVPB 2 g/50 mL premix        2 g 100 mL/hr over 30 Minutes  Intravenous 30 min pre-op 10/21/12 4098 10/21/12 1130          Current Facility-Administered Medications  Medication Dose Route Frequency Provider Last Rate Last Dose  . acetaminophen (OFIRMEV) IV 1,000 mg  1,000 mg Intravenous Once Anner Crete, MD      . acetaminophen (TYLENOL) tablet 650 mg  650 mg Oral Q4H PRN Anner Crete, MD      . alum & mag hydroxide-simeth (MAALOX/MYLANTA) 200-200-20 MG/5ML suspension 40 mL  40 mL Oral Q6H PRN Jetta Lout, NP   40 mL at 10/22/12 2029  . bisacodyl (DULCOLAX) suppository 10 mg  10 mg Rectal Daily PRN Anner Crete, MD      . docusate sodium (COLACE) capsule 100 mg  100 mg Oral BID Anner Crete, MD   100 mg at 10/22/12 2200  . HYDROmorphone (DILAUDID) injection 0.5-1 mg  0.5-1 mg Intravenous Q2H PRN Anner Crete, MD      . labetalol (NORMODYNE) tablet 150 mg  150 mg Oral BID Anner Crete, MD   150 mg at 10/22/12 2200  . ondansetron (ZOFRAN) injection 4 mg  4 mg Intravenous Q4H PRN Anner Crete, MD   4 mg at 10/23/12 1191  . opium-belladonna (B&O SUPPRETTES) suppository 1 suppository  1 suppository Rectal Q6H PRN Anner Crete, MD   1 suppository at 10/22/12 0803  .  pramipexole (MIRAPEX) tablet 0.25 mg  0.25 mg Oral QHS Anner Crete, MD   0.25 mg at 10/22/12 2200  . sodium chloride irrigation 0.9 % 3,000 mL  3,000 mL Irrigation Continuous Anner Crete, MD   3,000 mL at 10/22/12 0150  . sulfamethoxazole-trimethoprim (BACTRIM DS) 800-160 MG per tablet 1 tablet  1 tablet Oral BID Anner Crete, MD   1 tablet at 10/22/12 2200  . traMADol (ULTRAM) tablet 50 mg  50 mg Oral Q6H PRN Anner Crete, MD   50 mg at 10/22/12 1603  . zolpidem (AMBIEN) tablet 5 mg  5 mg Oral QHS PRN Anner Crete, MD      . [DISCONTINUED] dextrose 5 % and 0.45 % NaCl with KCl 20 mEq/L infusion   Intravenous Continuous Anner Crete, MD 100 mL/hr at 10/22/12 0150      Assessment: s/p Procedure(s): CYSTOSCOPY WITH RETROGRADE PYELOGRAM TRANSURETHRAL RESECTION OF THE PROSTATE (TURP)  He  has voided but only a small amount. He has some nausea and knee pain. Plan: I am going to have a bladder scan checked because of his history of chronic retention. Dulcolax suppository. I will give him IV tylenol for the pain and add phenergan for the nausea. I will recheck later today.   LOS: 2 days    Anner Crete 10/23/2012

## 2012-10-23 NOTE — Progress Notes (Signed)
Patient ID: Jeffrey Dennis, male   DOB: [birth year], 78 y.o.   MRN: [id number]  Appreciate hospitalist input.   He is slowly becoming more alert and his urine is clearing.   His Cr is 2.12 which is minimally increased.  He has mild hyponatremia at 132.   His PO2 was 69 with a CO2 of 41.  WBC is 12.8 with a Hgb of 11.5.  CXR has no acute abnormalities.  His Path is BPH only.   We will monitor his condition and discharge when medically stable.  He could go home with his foley and then return to the office next week for a voiding triad.  That would give him time to recover his strength from the surgery.

## 2012-10-23 NOTE — Progress Notes (Signed)
Patient ID: Jeffrey Dennis, male   DOB: 07/30/1930, 78 y.o.   MRN: [id number]  Mr. Yono has not voided since the initial 30cc this am.  He has not had much urge.  A bladder scan showed a PVR of 500cc, but he has had a chronic PVR of 900cc.   He is also quite solumnent and has had persistent nausea.   He was given phenergan earlier for nausea but has not had any other sedating meds today.   I spoke to pharmacy and they felt renal function testing was indicated in the face of the bactrim.  I will order a CBC and BMP.  An 62fr foley will be placed and I will consult the hospitalist service because of his lethargy.   He will need to be changed to an inpatient status.   I placed the foley and got about 500cc of bloody urine without clots in return.   I am going to resume IV fluids and hold the bactrim pending his labwork.   I will also cancel the phenergan which might be the cause of his sedation.

## 2012-10-24 DIAGNOSIS — K2970 Gastritis, unspecified, without bleeding: Secondary | ICD-10-CM

## 2012-10-24 LAB — CBC
MCHC: 33 g/dL (ref 30.0–36.0)
Platelets: 166 10*3/uL (ref 150–400)
RDW: 14.2 % (ref 11.5–15.5)
WBC: 12.3 10*3/uL — ABNORMAL HIGH (ref 4.0–10.5)

## 2012-10-24 LAB — CREATININE, URINE, RANDOM: Creatinine, Urine: 136.9 mg/dL

## 2012-10-24 LAB — RENAL FUNCTION PANEL
Albumin: 2.9 g/dL — ABNORMAL LOW (ref 3.5–5.2)
BUN: 25 mg/dL — ABNORMAL HIGH (ref 6–23)
GFR calc Af Amer: 37 mL/min — ABNORMAL LOW (ref >90)
GFR calc non Af Amer: 32 mL/min — ABNORMAL LOW (ref >90)
Phosphorus: 2.8 mg/dL (ref 2.3–4.6)
Potassium: 3.9 mEq/L (ref 3.5–5.1)
Sodium: 130 mEq/L — ABNORMAL LOW (ref 135–145)

## 2012-10-24 LAB — SODIUM, URINE, RANDOM: Sodium, Ur: 99 mEq/L

## 2012-10-24 MED ORDER — ONDANSETRON HCL 4 MG/2ML IJ SOLN: 4.0000 mg | Freq: Three times a day (TID) | INTRAMUSCULAR | Status: DC | PRN

## 2012-10-24 MED ORDER — SUCRALFATE 1 GM/10ML PO SUSP
1.0000 g | Freq: Three times a day (TID) | ORAL | Status: DC
  Administered 2012-10-24 – 2012-10-25 (×4): 1 g via ORAL
  Filled 2012-10-24 (×7): qty 10

## 2012-10-24 MED ORDER — PANTOPRAZOLE SODIUM 40 MG PO TBEC
40.0000 mg | DELAYED_RELEASE_TABLET | Freq: Every day | ORAL | Status: DC
  Administered 2012-10-24 – 2012-10-25 (×2): 40 mg via ORAL
  Filled 2012-10-24 (×2): qty 1

## 2012-10-24 NOTE — Progress Notes (Signed)
Patient ID: Jeffrey Dennis, male   DOB: 1930-04-17, 78 y.o.   MRN: [id number]   Nurse reports patient more alert this afternoon but he required a lot of assistance of get up earlier. He hasn't ambulated on his own. Poor po as he feels bloated and nauseous.  He is passing flatus and had a BM. No emesis.   AFVSS A&Ox3 Abd - soft, NT GU - urine clear  Imp - POD#3 TURP, bilateral RGP - Plan - PT consult Continue to monitor Cr at baseline 1.9

## 2012-10-24 NOTE — Evaluation (Addendum)
Physical Therapy Evaluation Patient Details Name: Jeffrey Dennis MRN: [id number] DOB: Sep 30, 1930 Today's Date: 10/24/2012 Time: 7829-5621 PT Time Calculation (min): 17 min  PT Assessment / Plan / Recommendation Clinical Impression  Pt admitted for encephalopathy and acute on chronic renal failure, also s/p TURP.  Pt would benefit from acute PT services in order to improve safety and independence with transfers and short distance ambulation.  Pt reports household ambulator with RW and poor LE strength due to arthritis in knees.  Pt stated he would consider rehab prior to home but also would have family be able to assist (therapist not sure they could provide required assist level at this time).    PT Assessment  Patient needs continued PT services    Follow Up Recommendations  SNF;Supervision for mobility/OOB;CIR    Does the patient have the potential to tolerate intense rehabilitation      Barriers to Discharge        Equipment Recommendations  Rolling walker with 5" wheels;Wheelchair (measurements PT)    Recommendations for Other Services     Frequency Min 3X/week    Precautions / Restrictions Precautions Precautions: Fall   Pertinent Vitals/Pain Pt reports no pain currently.     Mobility  Bed Mobility Bed Mobility: Supine to Sit Supine to Sit: 4: Min assist;HOB elevated Details for Bed Mobility Assistance: assist for trunk, increased time and effort Transfers Transfers: Sit to Stand;Stand to Sit Sit to Stand: 4: Min guard;With upper extremity assist;From bed Stand to Sit: 3: Mod assist;To chair/3-in-1 Details for Transfer Assistance: pt held onto RW and pulled up, didn't wait as instructed by therapist, assist to control descent and ensure safety as pt rushing due to fatigue and turning while sitting instead of backing up to chair Ambulation/Gait Ambulation/Gait Assistance: 1: +2 Total assist Ambulation/Gait: Patient Percentage: 60% Ambulation Distance (Feet): 13  Feet Assistive device: Rolling walker Ambulation/Gait Assistance Details: verbal cues for staying inside RW, step length, posture, safety near end of ambulation due to rushing to chair from fatigue Gait Pattern: Step-through pattern;Trunk flexed;Decreased stride length    Shoulder Instructions     Exercises     PT Diagnosis: Difficulty walking;Generalized weakness  PT Problem List: Decreased strength;Decreased activity tolerance;Decreased mobility;Decreased knowledge of use of DME PT Treatment Interventions: DME instruction;Gait training;Functional mobility training;Therapeutic activities;Therapeutic exercise;Patient/family education;Wheelchair mobility training   PT Goals Acute Rehab PT Goals PT Goal Formulation: With patient Time For Goal Achievement: 10/31/12 Potential to Achieve Goals: Good Pt will go Supine/Side to Sit: with modified independence PT Goal: Supine/Side to Sit - Progress: Goal set today Pt will go Sit to Stand: with modified independence PT Goal: Sit to Stand - Progress: Goal set today Pt will go Stand to Sit: with modified independence PT Goal: Stand to Sit - Progress: Goal set today Pt will Ambulate: 51 - 150 feet;with supervision;with rolling walker PT Goal: Ambulate - Progress: Goal set today Pt will Perform Home Exercise Program: with supervision, verbal cues required/provided PT Goal: Perform Home Exercise Program - Progress: Goal set today  Visit Information  Last PT Received On: 10/24/12 Assistance Needed: +2    Subjective Data  Subjective: I have bad knees.   Prior Functioning  Home Living Lives With: Spouse Available Help at Discharge: Family Type of Home: House Home Access: Level entry Home Layout: One level Home Adaptive Equipment: Other (comment) (rollator) Prior Function Level of Independence: Independent with assistive device(s) Comments: Pt reports always using rollator. Communication Communication: No difficulties    Cognition  Overall Cognitive Status: Appears within functional limits for tasks assessed/performed Arousal/Alertness: Awake/alert Orientation Level: Appears intact for tasks assessed Behavior During Session: [facility] for tasks performed    Extremity/Trunk Assessment Right Upper Extremity Assessment RUE ROM/Strength/Tone: [facility] for tasks assessed Left Upper Extremity Assessment LUE ROM/Strength/Tone: WFL for tasks assessed Right Lower Extremity Assessment RLE ROM/Strength/Tone: Deficits RLE ROM/Strength/Tone Deficits: functional weakness observed with ambulation, pt reports "bad knees" due to arthritis, grossly at least 3/5 throughout Left Lower Extremity Assessment LLE ROM/Strength/Tone: Deficits LLE ROM/Strength/Tone Deficits: functional weakness observed with ambulation, pt reports "bad knees" due to arthritis, grossly at least 3/5 throughout   Balance    End of Session PT - End of Session Activity Tolerance: Patient limited by fatigue Patient left: in chair;with call bell/phone within reach Nurse Communication: Mobility status;Other (comment) (+2 for safety, pt up in chair)  GP     Exavior Kimmons,KATHrine E 10/24/2012, 3:57 PM Pager: [phone]

## 2012-10-24 NOTE — Progress Notes (Signed)
[facility] PROGRESS NOTE  Jeffrey Dennis MWU:[id number] DOB: 05-Dec-1929 DOA: 10/21/2012 PCP: Jeffrey Penton., MD  Assessment/Plan: 1. Encephalopathy: Probably secondary to medications like pramipexole, phenergan. Also component of infection. Resolved.  2. Acute on Chronic renal failure. Per admission HPI last Cr was at 1.9. Continue with IV fluids. Cr trending down. 3. Mild hyponatremia: Likely secondary to decrease volume. Continue with IV fluids.  4. Mild Leukocytosis. Probably related to UTI. Stable. Chest x ray negative. Continue with ceftriaxone. 5. UTI: Follow urine culture. Ceftriaxone day 2.  6. Gastritis/ Esophagitis: Will start protonix and Carafate.  I will followup again tomorrow. Please contact me if I can be of assistance in the meanwhile. Thank you for this consultation.   Antibiotics:  Ceftriaxone 12-12  HPI/Subjective: Awake, interactive. Complaining today of nausea, hiccup, epigastric pain, and pain when swallow.   Objective: Filed Vitals:   10/23/12 2105 10/23/12 2247 10/24/12 0539 10/24/12 1300  BP: 130/65 127/71 137/75 123/76  Pulse: 86 76 75 77  Temp:  97.9 F (36.6 C) 98.4 F (36.9 C) 98.6 F (37 C)  TempSrc:  Oral Oral Oral  Resp:  16 16 19   Height:      Weight:      SpO2:  95% 96% 97%    Intake/Output Summary (Last 24 hours) at 10/24/12 1512 Last data filed at 10/24/12 0540  Gross per 24 hour  Intake    660 ml  Output    970 ml  Net   -310 ml   Filed Weights   10/21/12 0907 10/21/12 1518  Weight: 105.7 kg (233 lb 0.4 oz) 105.144 kg (231 lb 12.8 oz)    Exam:   General:  Awake, oriented, no distress.  Cardiovascular: S 1, S 2 RRR  Respiratory: CTA  Abdomen: BS present, soft, nt.  Data Reviewed: Basic Metabolic Panel:  Lab 10/24/12 7253 10/23/12 1245  NA 130* 132*  K 3.9 4.0  CL 96 95*  CO2 23 25  GLUCOSE 105* 126*  BUN 25* 21  CREATININE 1.91* 2.12*  CALCIUM 9.2 9.5  MG -- --  PHOS 2.8 --   Liver Function  Tests:  Lab 10/24/12 0505 10/23/12 1245  AST -- 16  ALT -- 10  ALKPHOS -- 96  BILITOT -- 0.6  PROT -- 6.3  ALBUMIN 2.9* 3.2*   CBC:  Lab 10/24/12 0505 10/23/12 1245  WBC 12.3* 12.8*  NEUTROABS -- --  HGB 11.0* 11.5*  HCT 33.3* 32.9*  MCV 89.0 88.0  PLT 166 184   Cardiac Enzymes: No results found for this basename: CKTOTAL:5,CKMB:5,CKMBINDEX:5,TROPONINI:5 in the last 168 hours BNP (last 3 results) No results found for this basename: PROBNP:3 in the last 8760 hours CBG: No results found for this basename: GLUCAP:5 in the last 168 hours  Recent Results (from the past 240 hour(s))  SURGICAL PCR SCREEN     Status: Normal   Collection Time   10/20/12  1:29 PM      Component Value Range Status Comment   MRSA, PCR NEGATIVE  NEGATIVE Final    Staphylococcus aureus NEGATIVE  NEGATIVE Final      Studies: Dg Chest 2 View  10/23/2012  *RADIOLOGY REPORT*  Clinical Data: Altered mental status, congestion, shortness of breath, drainage, hypertension  CHEST - 2 VIEW  Comparison: 10/20/2012  Findings: Normal heart size, mediastinal contours, and pulmonary vascularity. Minimal linear scarring left base stable. Chronic bronchitic changes. No pulmonary infiltrate, pleural effusion, or pneumothorax. Bones demineralized. Multilevel endplate spur formation thoracic spine.  IMPRESSION: Chronic bronchitic changes with minimal left basilar scarring. No acute abnormalities.   Original Report Authenticated By: Jeffrey Dennis, M.D.     Scheduled Meds:   . cefTRIAXone (ROCEPHIN)  IV  1 g Intravenous Q24H  . docusate sodium  100 mg Oral BID  . labetalol  150 mg Oral BID  . pantoprazole  40 mg Oral Daily  . sucralfate  1 g Oral TID WC & HS   Continuous Infusions:   . sodium chloride 75 mL/hr at 10/24/12 0410  . sodium chloride irrigation 3,000 mL (10/22/12 0150)    Active Problems:  Acute on chronic renal failure  Encephalopathy acute  Leukocytosis    Time spent: 35  minutes.    Jeffrey Dennis  [facility] Pager [phone]. If 8PM-8AM, please contact night-coverage at [email or web address], password [facility] 10/24/2012, 3:12 PM  LOS: 3 days

## 2012-10-24 NOTE — Progress Notes (Signed)
Rehab Admissions Coordinator Note:  Patient was screened by Clois Dupes for appropriateness for an Inpatient Acute Rehab Consult. Noted PT eval  Of SNF vs CIR. [employer] Medicare will not approve an inpt rehab admission for this diagnosis.  At this time, we are recommending Skilled Nursing Facility.  Clois Dupes, RN 10/24/2012, 4:03 PM  I can be reached at [phone].

## 2012-10-25 LAB — BASIC METABOLIC PANEL
BUN: 25 mg/dL — ABNORMAL HIGH (ref 6–23)
CO2: 25 mEq/L (ref 19–32)
Chloride: 101 mEq/L (ref 96–112)
GFR calc Af Amer: 37 mL/min — ABNORMAL LOW (ref >90)
Potassium: 4.1 mEq/L (ref 3.5–5.1)

## 2012-10-25 LAB — URINE CULTURE: Culture: NO GROWTH

## 2012-10-25 MED ORDER — CEPHALEXIN 250 MG PO CAPS: 250.0000 mg | ORAL_CAPSULE | Freq: Two times a day (BID) | ORAL | Status: DC

## 2012-10-25 MED ORDER — TRAMADOL HCL 50 MG PO TABS: 50.0000 mg | ORAL_TABLET | Freq: Four times a day (QID) | ORAL | Status: DC | PRN

## 2012-10-25 NOTE — Progress Notes (Signed)
Physical Therapy Treatment Patient Details Name: Jeffrey Dennis MRN: [id number] DOB: 1930-03-05 Today's Date: 10/25/2012 Time: 4098-1191 PT Time Calculation (min): 17 min  PT Assessment / Plan / Recommendation Comments on Treatment Session  Pt making great progress toward goals today with incr activity and less assistance/cues needed for mobiltiy. Pt close to baseline per spouse. Safe to go home with 24hour supervision and home health at current functional level.    Follow Up Recommendations  Home health PT;Supervision/Assistance - 24 hour           Equipment Recommendations  None recommended by PT;Other (comment) (pt has all needed DME)       Frequency Min 3X/week   Plan Discharge plan needs to be updated;Frequency remains appropriate    Precautions / Restrictions Precautions Precautions: Fall       Mobility  Bed Mobility Bed Mobility: Sitting - Scoot to Edge of Bed Supine to Sit: 5: Supervision;HOB flat Sitting - Scoot to Edge of Bed: 5: Supervision Details for Bed Mobility Assistance: no cues or assistance needed with sitting up to the edge of the bed. Transfers Sit to Stand: 5: Supervision;From bed;With upper extremity assist Stand to Sit: 5: Supervision;To bed;With upper extremity assist Details for Transfer Assistance: repeated transfer x2 for education on safer hand placement (not to use the walker). Pt reports he has a rollator at home that he locks and puts one hand on it and one on the stable surface. Ambulation/Gait Ambulation/Gait Assistance: 5: Supervision Ambulation Distance (Feet): 50 Feet Assistive device: Standard walker Ambulation/Gait Assistance Details: occasional cues to stay with walker and for upright posture with gait. Gait Pattern: Step-through pattern;Decreased step length - right;Decreased step length - left;Decreased stride length;Antalgic;Decreased stance time - left Stairs: Yes Stairs Assistance: 5: Supervision Stair Management Technique: No  rails;With walker;Step to pattern;Forwards Number of Stairs: 1      PT Goals Acute Rehab PT Goals PT Goal: Supine/Side to Sit - Progress: Met PT Goal: Sit to Stand - Progress: Progressing toward goal PT Goal: Stand to Sit - Progress: Progressing toward goal PT Goal: Ambulate - Progress: Met Pt will Go Up / Down Stairs: 1-2 stairs;with least restrictive assistive device;with min assist PT Goal: Up/Down Stairs - Progress: Met PT Goal: Perform Home Exercise Program - Progress: Progressing toward goal  Visit Information  Last PT Received On: 10/25/12 Assistance Needed: +1    Subjective Data  Subjective: Reports feeling much better today. Still with knee pain, reports "It always hurts".   Cognition  Overall Cognitive Status: Appears within functional limits for tasks assessed/performed Arousal/Alertness: Awake/alert Orientation Level: Appears intact for tasks assessed Behavior During Session: [facility] for tasks performed       End of Session PT - End of Session Equipment Utilized During Treatment: Gait belt Activity Tolerance: Patient tolerated treatment well Patient left: in bed;with call bell/phone within reach;with nursing in room;with family/visitor present Nurse Communication: Mobility status;Other (comment) (needs for home health)   GP     Sallyanne Kuster 10/25/2012, 3:06 PM Sallyanne Kuster, PTA Office- [phone]  Rebeca Alert, PT  [phone]

## 2012-10-25 NOTE — Progress Notes (Signed)
Patient seen and examined. Feeling better. Indigestion improved.  Was able to ambulate with PT, likely going home today. Discharge on protonix 1 tablet daily. Avoid Bactrim due to renal insufficiency, consider Cipro, Keflex. Hyponatremia improved. Renal function at baseline.  Kahley Leib, Md.

## 2012-10-25 NOTE — Progress Notes (Signed)
Report received from T. Thermon Leyland, Charity fundraiser. No change from initial am assessment. Will continue to follow the plan of care.

## 2012-10-25 NOTE — Progress Notes (Addendum)
10/25/12 1730 In to speak with pt and family about [facility] services.  Pt. lives in [place], [place].  Pt. chose [facility].  TC to [facility] [phone])  to give referral for [facility] PT.  Faxed facesheet, HH orders, PT notes, and discharge summary to [phone]).  Pt. to dc home today with family.   Tera Mater, RN, BSN   10/25/12 1737 Faxed paperwork to this fax number also [phone]), which is the central intake office.  The first fax was to the main office.   Tera Mater, RN, BSN

## 2012-10-25 NOTE — Discharge Summary (Signed)
Physician Discharge Summary  Patient ID: Jeffrey Dennis MRN: [id number] DOB/AGE: 02-15-1930 78 y.o.  Admit date: 10/21/2012 Discharge date: 10/25/2012  Admission Diagnoses: Benign Prostatic Hyperplasia  Discharge Diagnoses: Benign Prostatic Hyperplasia Active Problems:  Acute on chronic renal failure  Encephalopathy acute  Leukocytosis  Gastritis   Discharged Condition: good  Hospital Course: Pt underwent transurethral resection of prostate by Bjorn Pippin on 12/10, the day of admission without acute complications. He was admitted to the 4th floor Urology service where he began his recovery. On POD1 he was noted to be confused and have increased Cr consistent with acute on chronic renal failure. Hospitalitis service was consulted that changed medication regimen and treated for empiric UTI and GERD. With these changes his mental status improved significantly. PT was consulted and cleared him for home with home health. By 12/14, the day of discharge, he was ambulatory to baseline, tolerating diet, pain controlled, mental status at baseline and felt to be adequate for discharge.   Consults: internal medicine, physical therapy  Significant Diagnostic Studies: labs: Cr 1.9 at discharge (baseline)  Treatments: IV hydration, antibiotics: ceftriaxone and surgery: transurethral resection of prostate by Bjorn Pippin on 12/10  Discharge Exam: Blood pressure 104/68, pulse 84, temperature 97.8 F (36.6 C), temperature source Oral, resp. rate 16, height 5\' 10"  (1.778 m), weight 105.144 kg (231 lb 12.8 oz), SpO2 96.00%. General appearance: alert, cooperative and appears older than stated age Head: Normocephalic, without obvious abnormality, atraumatic Eyes: conjunctivae/corneas clear. PERRL, EOM's intact. Fundi benign. Ears: normal TM's and external ear canals both ears Nose: Nares normal. Septum midline. Mucosa normal. No drainage or sinus tenderness. Throat: lips, mucosa, and tongue normal; teeth and  gums normal Neck: no adenopathy, no carotid bruit, no JVD, supple, symmetrical, trachea midline and thyroid not enlarged, symmetric, no tenderness/mass/nodules Back: symmetric, no curvature. ROM normal. No CVA tenderness. Resp: clear to auscultation bilaterally Chest wall: no tenderness Cardio: regular rate and rhythm, S1, S2 normal, no murmur, click, rub or gallop GI: soft, non-tender; bowel sounds normal; no masses,  no organomegaly Male genitalia: normal, foley c/d/i with yellow urine and ooccasional debris as expected.  Pelvic: cervix normal in appearance, external genitalia normal, no adnexal masses or tenderness, no cervical motion tenderness, rectovaginal septum normal, uterus normal size, shape, and consistency and vagina normal without discharge Extremities: extremities normal, atraumatic, no cyanosis or edema Pulses: 2+ and symmetric Skin: Skin color, texture, turgor normal. No rashes or lesions Lymph nodes: Cervical, supraclavicular, and axillary nodes normal. Neurologic: Grossly normal  (pelvic not performed, part of autotext) Disposition: Final discharge disposition not confirmed     Medication List     As of 10/25/2012  4:35 PM    STOP taking these medications         AYR 0.65 % nasal spray   Generic drug: sodium chloride      silodosin 8 MG Caps capsule   Commonly known as: RAPAFLO      sulfamethoxazole-trimethoprim 800-160 MG per tablet   Commonly known as: BACTRIM DS      TAKE these medications         cephALEXin 250 MG capsule   Commonly known as: KEFLEX   Take 1 capsule (250 mg total) by mouth 2 (two) times daily. X 6 days      labetalol 300 MG tablet   Commonly known as: NORMODYNE   Take 150 mg by mouth 2 (two) times daily.      pramipexole 0.5 MG tablet   Commonly known  as: MIRAPEX   Take 0.25 mg by mouth as needed. PT TAKES AS NEEDED FOR RESTLESS LEG SYNDROME      traMADol 50 MG tablet   Commonly known as: ULTRAM   Take 1 tablet (50 mg total)  by mouth every 6 (six) hours as needed.           Follow-up Information    Follow up with Anner Crete, MD. (Call Monday for appt for trial of void (catheter out))    Contact information:   [address] [place] [address] [phone]          Signed: Sebastian Ache 10/25/2012, 4:35 PM

## 2012-10-25 NOTE — Progress Notes (Signed)
Discharge instructions given to patient and family. No questions or concerns. Family chose [facility]. Patient escorted out via wheelchair assisted by nurse tech.

## 2012-10-25 NOTE — Progress Notes (Signed)
4 Days Post-Op  Subjective: 1 - Prostatic Hyperplasia - s/p TURP 10/21/2013 by Annabell Howells. Catheter in place as planned w/o irrigation and w/o problems.  2 - Acute Renal Failure - pt with rise in Cr from baseline of 1.9 transiently post-op, this has since come back to baseline.  3 - Urinary Tract Infection  - Pt with presumed possible UTI given post-op confusion, he is on empiric ABX.UCX pending. NO fevers  4 - Encephalopathy / Rehab - Pt with post-op confusion. Hospitalitis service following and made medication adjustments. Now nearly resolved.  Was  planning for SNF, but making even more improvement.   Objective: Vital signs in last 24 hours: Temp:  [97.8 F (36.6 C)-99.1 F (37.3 C)] 97.8 F (36.6 C) (12/14 0652) Pulse Rate:  [71-77] 74  (12/14 0652) Resp:  [16-20] 16  (12/14 0652) BP: (116-123)/(59-76) 119/64 mmHg (12/14 0652) SpO2:  [97 %-98 %] 98 % (12/14 0652) Last BM Date: 10/23/12  Intake/Output from previous day: 12/13 0701 - 12/14 0700 In: 947.5 [I.V.:897.5; IV Piggyback:50] Out: 4050 [Urine:4050] Intake/Output this shift:    General appearance: alert, cooperative, appears stated age and wife at bedside Head: Normocephalic, without obvious abnormality, atraumatic Eyes: conjunctivae/corneas clear. PERRL, EOM's intact. Fundi benign. Ears: normal TM's and external ear canals both ears Throat: lips, mucosa, and tongue normal; teeth and gums normal Neck: no adenopathy, no carotid bruit, no JVD, supple, symmetrical, trachea midline and thyroid not enlarged, symmetric, no tenderness/mass/nodules Back: symmetric, no curvature. ROM normal. No CVA tenderness. Resp: clear to auscultation bilaterally Chest wall: no tenderness Cardio: regular rate and rhythm, S1, S2 normal, no murmur, click, rub or gallop GI: soft, non-tender; bowel sounds normal; no masses,  no organomegaly Male genitalia: normal Extremities: extremities normal, atraumatic, no cyanosis or edema Pulses: 2+ and  symmetric Skin: Skin color, texture, turgor normal. No rashes or lesions Lymph nodes: Cervical, supraclavicular, and axillary nodes normal. Neurologic: Grossly normal Foley c/d/i with yellow urine and occasional sediment. No gross blood or clots.  Lab Results:   Basename 10/24/12 0505 10/23/12 1245  WBC 12.3* 12.8*  HGB 11.0* 11.5*  HCT 33.3* 32.9*  PLT 166 184   BMET  Basename 10/25/12 0530 10/24/12 0505  NA 134* 130*  K 4.1 3.9  CL 101 96  CO2 25 23  GLUCOSE 101* 105*  BUN 25* 25*  CREATININE 1.91* 1.91*  CALCIUM 9.2 9.2   PT/INR No results found for this basename: LABPROT:2,INR:2 in the last 72 hours ABG  Basename 10/23/12 1438  PHART 7.408  HCO3 25.4*    Studies/Results: Dg Chest 2 View  10/23/2012  *RADIOLOGY REPORT*  Clinical Data: Altered mental status, congestion, shortness of breath, drainage, hypertension  CHEST - 2 VIEW  Comparison: 10/20/2012  Findings: Normal heart size, mediastinal contours, and pulmonary vascularity. Minimal linear scarring left base stable. Chronic bronchitic changes. No pulmonary infiltrate, pleural effusion, or pneumothorax. Bones demineralized. Multilevel endplate spur formation thoracic spine.  IMPRESSION: Chronic bronchitic changes with minimal left basilar scarring. No acute abnormalities.   Original Report Authenticated By: Ulyses Southward, M.D.     Anti-infectives: Anti-infectives     Start     Dose/Rate Route Frequency Ordered Stop   10/23/12 1800   cefTRIAXone (ROCEPHIN) 1 g in dextrose 5 % 50 mL IVPB        1 g 100 mL/hr over 30 Minutes Intravenous Every 24 hours 10/23/12 1738     10/21/12 2200   sulfamethoxazole-trimethoprim (BACTRIM DS) 800-160 MG per tablet 1 tablet  Status:  Discontinued        1 tablet Oral 2 times daily 10/21/12 1528 10/23/12 1236   10/21/12 0909   gentamicin (GARAMYCIN) 430 mg in dextrose 5 % 100 mL IVPB        430 mg 110.8 mL/hr over 60 Minutes Intravenous 60 min pre-op 10/21/12 0909 10/21/12 1145    10/21/12 0909   ceFAZolin (ANCEF) IVPB 2 g/50 mL premix        2 g 100 mL/hr over 30 Minutes Intravenous 30 min pre-op 10/21/12 4098 10/21/12 1130          Assessment/Plan: 1 - Prostatic Hyperplasia - s/p TURP 10/21/2013. Foley to stay, will get trial of void next week as outpatient.   2 - Acute Renal Failure - resolved.   3 - Urinary Tract Infection  - no active infection parameters, final UCX pending.   4 - Encephalopathy / Rehab - Appears to be at baseline per family. Will seek repeat PT eval, if cleared, home even as soon as today.  [facility] [place] [facility], Cai Flott 10/25/2012

## 2013-11-10 ENCOUNTER — Ambulatory Visit: Payer: Self-pay | Admitting: Ophthalmology

## 2014-02-23 ENCOUNTER — Ambulatory Visit: Payer: Self-pay | Admitting: Ophthalmology

## 2020-02-13 ENCOUNTER — Telehealth: Payer: Self-pay | Admitting: Emergency Medicine

## 2020-02-13 ENCOUNTER — Emergency Department: Payer: Medicare Other

## 2020-02-13 ENCOUNTER — Other Ambulatory Visit: Payer: Self-pay

## 2020-02-13 ENCOUNTER — Emergency Department
Admission: EM | Admit: 2020-02-13 | Discharge: 2020-02-13 | Disposition: A | Discharge status: Home / Self Care | Payer: Medicare Other | Source: Home / Self Care | Attending: Student | Admitting: Student

## 2020-02-13 DIAGNOSIS — R339 Retention of urine, unspecified: Secondary | ICD-10-CM | POA: Insufficient documentation

## 2020-02-13 DIAGNOSIS — Z79899 Other long term (current) drug therapy: Secondary | ICD-10-CM | POA: Insufficient documentation

## 2020-02-13 DIAGNOSIS — D494 Neoplasm of unspecified behavior of bladder: Secondary | ICD-10-CM | POA: Insufficient documentation

## 2020-02-13 DIAGNOSIS — N390 Urinary tract infection, site not specified: Secondary | ICD-10-CM | POA: Insufficient documentation

## 2020-02-13 DIAGNOSIS — R360 Urethral discharge without blood: Secondary | ICD-10-CM | POA: Insufficient documentation

## 2020-02-13 DIAGNOSIS — N3000 Acute cystitis without hematuria: Secondary | ICD-10-CM | POA: Diagnosis not present

## 2020-02-13 DIAGNOSIS — I10 Essential (primary) hypertension: Secondary | ICD-10-CM | POA: Insufficient documentation

## 2020-02-13 LAB — CBC WITH DIFFERENTIAL/PLATELET
Abs Immature Granulocytes: 0.06 10*3/uL (ref 0.00–0.07)
Basophils Absolute: 0.1 10*3/uL (ref 0.0–0.1)
Basophils Relative: 1 %
Eosinophils Absolute: 0.3 10*3/uL (ref 0.0–0.5)
Eosinophils Relative: 2 %
HCT: 42.6 % (ref 39.0–52.0)
Hemoglobin: 13.9 g/dL (ref 13.0–17.0)
Immature Granulocytes: 1 %
Lymphocytes Relative: 18 %
Lymphs Abs: 2.4 10*3/uL (ref 0.7–4.0)
MCH: 30.5 pg (ref 26.0–34.0)
MCHC: 32.6 g/dL (ref 30.0–36.0)
MCV: 93.4 fL (ref 80.0–100.0)
Monocytes Absolute: 1.1 10*3/uL — ABNORMAL HIGH (ref 0.1–1.0)
Monocytes Relative: 8 %
Neutro Abs: 9.3 10*3/uL — ABNORMAL HIGH (ref 1.7–7.7)
Neutrophils Relative %: 70 %
Platelets: 198 10*3/uL (ref 150–400)
RBC: 4.56 MIL/uL (ref 4.22–5.81)
RDW: 13.2 % (ref 11.5–15.5)
WBC: 13.1 10*3/uL — ABNORMAL HIGH (ref 4.0–10.5)
nRBC: 0 % (ref 0.0–0.2)

## 2020-02-13 LAB — URINALYSIS, COMPLETE (UACMP) WITH MICROSCOPIC
Bilirubin Urine: NEGATIVE
Glucose, UA: NEGATIVE mg/dL
Ketones, ur: NEGATIVE mg/dL
Nitrite: POSITIVE — AB
Protein, ur: 30 mg/dL — AB
Specific Gravity, Urine: 1.006 (ref 1.005–1.030)
WBC, UA: >50 WBC/hpf — ABNORMAL HIGH (ref 0–5)
pH: 7 (ref 5.0–8.0)

## 2020-02-13 LAB — BASIC METABOLIC PANEL
Anion gap: 10 (ref 5–15)
BUN: 22 mg/dL (ref 8–23)
CO2: 25 mmol/L (ref 22–32)
Calcium: 11.6 mg/dL — ABNORMAL HIGH (ref 8.9–10.3)
Chloride: 102 mmol/L (ref 98–111)
Creatinine, Ser: 1.28 mg/dL — ABNORMAL HIGH (ref 0.61–1.24)
GFR calc Af Amer: 58 mL/min — ABNORMAL LOW (ref >60)
GFR calc non Af Amer: 50 mL/min — ABNORMAL LOW (ref >60)
Glucose, Bld: 115 mg/dL — ABNORMAL HIGH (ref 70–99)
Potassium: 4.1 mmol/L (ref 3.5–5.1)
Sodium: 137 mmol/L (ref 135–145)

## 2020-02-13 MED ORDER — SULFAMETHOXAZOLE-TRIMETHOPRIM 800-160 MG PO TABS: 1.0000 | ORAL_TABLET | Freq: Two times a day (BID) | ORAL | 0 refills | Status: DC

## 2020-02-13 MED ORDER — SULFAMETHOXAZOLE-TRIMETHOPRIM 800-160 MG PO TABS
1.0000 | ORAL_TABLET | Freq: Once | ORAL | Status: AC
  Administered 2020-02-13: 10:00:00 1 via ORAL
  Filled 2020-02-13: qty 1

## 2020-02-13 MED ORDER — SODIUM CHLORIDE 0.9 % IV SOLN
1.0000 g | Freq: Once | INTRAVENOUS | Status: AC
  Administered 2020-02-13: 08:00:00 1 g via INTRAVENOUS
  Filled 2020-02-13: qty 10

## 2020-02-13 MED ORDER — MELOXICAM 7.5 MG PO TABS: 7.5000 mg | ORAL_TABLET | Freq: Every day | ORAL | 0 refills | Status: DC

## 2020-02-13 NOTE — ED Triage Notes (Signed)
Pt states was urinating at midnight when something about an inch long came out of his penis. Pt states was having penile burning during incident and now feels like he is having urinary retention.

## 2020-02-13 NOTE — Discharge Instructions (Addendum)
You have a urinary tract infection.  Your CT scan does not show any kidney stone.  You still have a mass in your bladder that you have seen urology for and that you should continue to follow with urology for.  Please call Dr. Bernardo Heater on Monday for a follow-up appointment next week.  Return to the emergency department for any worsening of symptoms.

## 2020-02-13 NOTE — ED Notes (Signed)
PT to room 53 for evaluation.  Pt states he is unable to get out of chair on his own, pt provided walker, states he is unsure of using this walker as it has wheels and can not be locked.  Pt states he can not not get from wheelchair to stretcher at this time.  Pt left in wheelchair at present.  Pt c/o discomfort with catheter.  States he has had them before and the only thing that helps is [place] with opium.  Pt informed that ED would likely not provide this medication.  Pt states Dr. Bernardo Heater gave it to him before.  Pt voices no other c/o at this time.  Pt awaiting provider presently.

## 2020-02-13 NOTE — Telephone Encounter (Signed)
Patient requesting pain medication.

## 2020-02-13 NOTE — ED Notes (Signed)
Cloudy yellow urine continues to drain into urine drainage bag. Pt updated on delay. Pt verbalizes concern regarding wait time. Pt states "call dr. [name] he'll see me". Pt informed that he will need to see ed md in the emergency department.

## 2020-02-13 NOTE — ED Notes (Signed)
Pt with 854mL in bladder during bladder scan.

## 2020-02-13 NOTE — ED Provider Notes (Signed)
[facility] Emergency Department Provider Note  ____________________________________________  Time seen: Approximately 7:56 AM  I have reviewed the triage vital signs and the nursing notes.   HISTORY  Chief Complaint Dysuria    HPI Jeffrey Dennis is a 86 y.o. male that presents to the emergency department for evaluation of penile discharge and urinary retention today.  Patient states that he was trying to use the restroom in the middle of the night when he noticed "something like chewing gum"come out of his penis.  He was only able to urinate a little bit.  He called his friend, who brought him to the emergency department.  He tried to bring the discharge with him but it must of fallen out of his pocket.  He has otherwise felt well. No recent illness.  Patient has a history of urinary retention.  He has seen Dr. Bernardo Heater with urology in the past.  Patient states that he just saw his primary care doctor about 2 weeks ago.  No fevers, abdominal pain.  Past Medical History:  Diagnosis Date  . Arthritis   . Bilateral hydronephrosis   . GERD (gastroesophageal reflux disease)   . Hearing decreased   . History of hepatitis A   . Hypertension   . Incontinence of urine     Patient Active Problem List   Diagnosis Date Noted  . Gastritis 10/24/2012  . Acute on chronic renal failure ([place]) 10/23/2012  . Encephalopathy acute 10/23/2012  . Leukocytosis 10/23/2012    Past Surgical History:  Procedure Laterality Date  . CYSTOSCOPY W/ RETROGRADES  10/21/2012   Procedure: CYSTOSCOPY WITH RETROGRADE PYELOGRAM;  Surgeon: Malka So, MD;  Location: [facility];  Service: Urology;  Laterality: Bilateral;  cystoscopy bilateral retrograde  ureteral dilatation    . NOSE SURGERY  1959  . PROSTATE SURGERY    . PROSTATECTOMY  2004  . TONSILLECTOMY    . TRANSURETHRAL RESECTION OF PROSTATE  10/21/2012   Procedure: TRANSURETHRAL RESECTION OF THE PROSTATE (TURP);  Surgeon: Malka So, MD;  Location: [facility];  Service: Urology;  Laterality: N/A;    Prior to Admission medications   Medication Sig Start Date End Date Taking? Authorizing Provider  cephALEXin (KEFLEX) 250 MG capsule Take 1 capsule (250 mg total) by mouth 2 (two) times daily. X 6 days 10/25/12   Alexis Frock, MD  labetalol (NORMODYNE) 300 MG tablet Take 150 mg by mouth 2 (two) times daily.    [provider]  pramipexole (MIRAPEX) 0.5 MG tablet Take 0.25 mg by mouth as needed. PT TAKES AS NEEDED FOR RESTLESS LEG SYNDROME    [provider]  sulfamethoxazole-trimethoprim (BACTRIM DS) 800-160 MG tablet Take 1 tablet by mouth 2 (two) times daily. 02/13/20   Laban Emperor, PA-C  traMADol (ULTRAM) 50 MG tablet Take 1 tablet (50 mg total) by mouth every 6 (six) hours as needed. 10/25/12   Alexis Frock, MD    Allergies Patient has no known allergies.  No family history on file.  Social History Social History   Tobacco Use  . Smoking status: Never Smoker  Substance Use Topics  . Alcohol use: No  . Drug use: No     Review of Systems  Constitutional: No fever/chills Cardiovascular: No chest pain. Respiratory: No SOB. Gastrointestinal: No abdominal pain.  No nausea, no vomiting.  Genitourinary: Negative for dysuria. Musculoskeletal: Negative for musculoskeletal pain. Skin: Negative for rash, abrasions, lacerations, ecchymosis. Neurological: Negative for headaches   ____________________________________________  PHYSICAL EXAM:  VITAL SIGNS: ED Triage Vitals [02/13/20 0438]  Enc Vitals Group     BP (!) 141/71     Pulse Rate 73     Resp 16     Temp 98.2 F (36.8 C)     Temp Source Oral     SpO2 97 %     Weight 218 lb (98.9 kg)     Height 5\' 11"  (1.803 m)     Head Circumference      Peak Flow      Pain Score 0     Pain Loc      Pain Edu?      Excl. in [place]?      Constitutional: Alert and oriented. Well appearing and in no acute distress. Eyes: Conjunctivae  are normal. PERRL. EOMI. Head: Atraumatic. ENT:      Ears:      Nose: No congestion/rhinnorhea.      Mouth/Throat: Mucous membranes are moist.  Neck: No stridor. Cardiovascular: Normal rate, regular rhythm.  Good peripheral circulation. Respiratory: Normal respiratory effort without tachypnea or retractions. Lungs CTAB. Good air entry to the bases with no decreased or absent breath sounds. Gastrointestinal: Bowel sounds 4 quadrants. Soft and nontender to palpation. No guarding or rigidity. No palpable masses. No distention. No CVA tenderness. Musculoskeletal: Full range of motion to all extremities. No gross deformities appreciated. Neurologic:  Normal speech and language. No gross focal neurologic deficits are appreciated.  Skin:  Skin is warm, dry and intact. No rash noted. Psychiatric: Mood and affect are normal. Speech and behavior are normal. Patient exhibits appropriate insight and judgement.   ____________________________________________   LABS (all labs ordered are listed, but only abnormal results are displayed)  Labs Reviewed  URINALYSIS, COMPLETE (UACMP) WITH MICROSCOPIC - Abnormal; Notable for the following components:      Result Value   Color, Urine YELLOW (*)    APPearance TURBID (*)    Hgb urine dipstick MODERATE (*)    Protein, ur 30 (*)    Nitrite POSITIVE (*)    Leukocytes,Ua LARGE (*)    WBC, UA >50 (*)    Bacteria, UA MANY (*)    All other components within normal limits  CBC WITH DIFFERENTIAL/PLATELET - Abnormal; Notable for the following components:   WBC 13.1 (*)    Neutro Abs 9.3 (*)    Monocytes Absolute 1.1 (*)    All other components within normal limits  BASIC METABOLIC PANEL - Abnormal; Notable for the following components:   Glucose, Bld 115 (*)    Creatinine, Ser 1.28 (*)    Calcium 11.6 (*)    GFR calc non Af Amer 50 (*)    GFR calc Af Amer 58 (*)    All other components within normal limits  URINE CULTURE    ____________________________________________  EKG   ____________________________________________  RADIOLOGY   CT Renal Stone Study  Result Date: 02/13/2020 CLINICAL DATA:  Hematuria with unknown cause EXAM: CT ABDOMEN AND PELVIS WITHOUT CONTRAST TECHNIQUE: Multidetector CT imaging of the abdomen and pelvis was performed following the standard protocol without IV contrast. COMPARISON:  None. FINDINGS: Lower chest:  Coronary atherosclerosis. Hepatobiliary: No focal liver abnormality.Gallstones within the contracted gallbladder. No associated inflammation. Pancreas: Unremarkable. Spleen: Unremarkable. Adrenals/Urinary Tract: Low-density nodule in the right adrenal gland measuring 19 mm, consistent with adenoma. No hydronephrosis or urolithiasis. Asymmetric right renal atrophy with smooth cortex, possibly chronic arterial compromise. A Foley catheter has been placed with decompression of the bladder.  The balloon is eccentric within the bladder due to marked right-sided thickening measuring 4 cm thickness, contiguous with a bulky and lobulated prostate. Stomach/Bowel: No evidence of obstruction or inflammation. There are left-sided diverticula. Vascular/Lymphatic: Extensive atherosclerotic calcification. No mass or adenopathy. Reproductive:Enlarged prostate, as above. Other: No ascites or pneumoperitoneum. Musculoskeletal: No acute abnormalities. Severe lumbar spine degeneration with L4-5 anterolisthesis and advanced canal/foraminal impingement. Extensive bridging osteophytes in the thoracic and mid lumbar spine. IMPRESSION: 1. Marked asymmetric thickening of the right-sided bladder which is contiguous with an enlarged and lobulated prostate. Prostate or urothelial carcinoma is the primary concern. 2. The bladder is decompressed and there is no hydronephrosis. 3. Cholelithiasis. 4. Advanced spinal degeneration especially at L4-5 where there is high-grade canal and foraminal impingement. Electronically  Signed   By: Monte Fantasia M.D.   On: 02/13/2020 09:09    ____________________________________________    PROCEDURES  Procedure(s) performed:    Procedures    Medications  cefTRIAXone (ROCEPHIN) 1 g in sodium chloride 0.9 % 100 mL IVPB (0 g Intravenous Stopped 02/13/20 0849)  sulfamethoxazole-trimethoprim (BACTRIM DS) 800-160 MG per tablet 1 tablet (1 tablet Oral Given 02/13/20 0952)     ____________________________________________   INITIAL IMPRESSION / ASSESSMENT AND PLAN / ED COURSE  Pertinent labs & imaging results that were available during my care of the patient were reviewed by me and considered in my medical decision making (see chart for details).  Review of the [place] CSRS was performed in accordance of the [place] prior to dispensing any controlled drugs.     Patient's diagnosis is consistent with cystitis.  Vital signs and exam are reassuring.  Patient overall feels well.  He denies any fever, nausea, vomiting, abdominal pain, systemic symptoms.  Urinalysis contributory for infection.  Urinary catheter in place, which will be taken out by urology.  Patient has a mild leukocytosis of 13.1.  Patient also had a mild leukocytosis 2 weeks ago of 11.9 on routine lab work.  Creatinine 1.28, BUN 22 and GFR is 50, which is similar to previous.  Patient denies any pain.  No nephrolithiasis on CT scan.  CT scan does show a bladder mass, which patient is following urology for.  Patient is sitting in the wheelchair and states that he he needs his locking walker in order to get up to the bed. Patient feels well. Patient will be discharged home with prescriptions for Bactrim. Patient is to follow up with urology as directed. Patient is given ED precautions to return to the ED for any worsening or new symptoms.   Jeremyah D Hambelton was evaluated in Emergency Department on 02/13/2020 for the symptoms described in the history of present illness. He was evaluated in the context of the global COVID-19  pandemic, which necessitated consideration that the patient might be at risk for infection with the SARS-CoV-2 virus that causes COVID-19. Institutional protocols and algorithms that pertain to the evaluation of patients at risk for COVID-19 are in a state of rapid change based on information released by regulatory bodies including the CDC and federal and state organizations. These policies and algorithms were followed during the patient's care in the ED.  ____________________________________________  FINAL CLINICAL IMPRESSION(S) / ED DIAGNOSES  Final diagnoses:  Lower urinary tract infectious disease  Neoplasm of bladder      NEW MEDICATIONS STARTED DURING THIS VISIT:  ED Discharge Orders         Ordered    sulfamethoxazole-trimethoprim (BACTRIM DS) 800-160 MG tablet  2 times daily  02/13/20 0926              This chart was dictated using voice recognition software/Dragon. Despite best efforts to proofread, errors can occur which can change the meaning. Any change was purely unintentional.    Laban Emperor, PA-C 02/13/20 1611    Lilia Pro., MD 02/13/20 [phone]

## 2020-02-15 ENCOUNTER — Emergency Department: Payer: Medicare Other

## 2020-02-15 ENCOUNTER — Inpatient Hospital Stay
Admission: EM | Admit: 2020-02-15 | Discharge: 2020-02-17 | DRG: 690 | Disposition: A | Discharge status: Home / Self Care | Payer: Medicare Other | Attending: Hospitalist | Admitting: Hospitalist

## 2020-02-15 ENCOUNTER — Other Ambulatory Visit: Payer: Self-pay

## 2020-02-15 DIAGNOSIS — M1990 Unspecified osteoarthritis, unspecified site: Secondary | ICD-10-CM | POA: Diagnosis present

## 2020-02-15 DIAGNOSIS — Z823 Family history of stroke: Secondary | ICD-10-CM | POA: Diagnosis not present

## 2020-02-15 DIAGNOSIS — B9620 Unspecified Escherichia coli [E. coli] as the cause of diseases classified elsewhere: Secondary | ICD-10-CM | POA: Diagnosis present

## 2020-02-15 DIAGNOSIS — Z20822 Contact with and (suspected) exposure to covid-19: Secondary | ICD-10-CM | POA: Diagnosis present

## 2020-02-15 DIAGNOSIS — N401 Enlarged prostate with lower urinary tract symptoms: Secondary | ICD-10-CM | POA: Diagnosis present

## 2020-02-15 DIAGNOSIS — R339 Retention of urine, unspecified: Secondary | ICD-10-CM

## 2020-02-15 DIAGNOSIS — I4510 Unspecified right bundle-branch block: Secondary | ICD-10-CM | POA: Diagnosis present

## 2020-02-15 DIAGNOSIS — Z1629 Resistance to other single specified antibiotic: Secondary | ICD-10-CM | POA: Diagnosis present

## 2020-02-15 DIAGNOSIS — R338 Other retention of urine: Secondary | ICD-10-CM | POA: Diagnosis present

## 2020-02-15 DIAGNOSIS — Z8249 Family history of ischemic heart disease and other diseases of the circulatory system: Secondary | ICD-10-CM | POA: Diagnosis not present

## 2020-02-15 DIAGNOSIS — I10 Essential (primary) hypertension: Secondary | ICD-10-CM | POA: Diagnosis present

## 2020-02-15 DIAGNOSIS — N390 Urinary tract infection, site not specified: Secondary | ICD-10-CM | POA: Insufficient documentation

## 2020-02-15 DIAGNOSIS — N3000 Acute cystitis without hematuria: Principal | ICD-10-CM | POA: Diagnosis present

## 2020-02-15 DIAGNOSIS — R531 Weakness: Secondary | ICD-10-CM

## 2020-02-15 DIAGNOSIS — K219 Gastro-esophageal reflux disease without esophagitis: Secondary | ICD-10-CM | POA: Diagnosis present

## 2020-02-15 LAB — CBC
HCT: 41 % (ref 39.0–52.0)
Hemoglobin: 13.5 g/dL (ref 13.0–17.0)
MCH: 30.8 pg (ref 26.0–34.0)
MCHC: 32.9 g/dL (ref 30.0–36.0)
MCV: 93.6 fL (ref 80.0–100.0)
Platelets: 212 10*3/uL (ref 150–400)
RBC: 4.38 MIL/uL (ref 4.22–5.81)
RDW: 13.1 % (ref 11.5–15.5)
WBC: 10.8 10*3/uL — ABNORMAL HIGH (ref 4.0–10.5)
nRBC: 0 % (ref 0.0–0.2)

## 2020-02-15 LAB — BASIC METABOLIC PANEL
Anion gap: 5 (ref 5–15)
BUN: 25 mg/dL — ABNORMAL HIGH (ref 8–23)
CO2: 24 mmol/L (ref 22–32)
Calcium: 11.3 mg/dL — ABNORMAL HIGH (ref 8.9–10.3)
Chloride: 103 mmol/L (ref 98–111)
Creatinine, Ser: 1.61 mg/dL — ABNORMAL HIGH (ref 0.61–1.24)
GFR calc Af Amer: 44 mL/min — ABNORMAL LOW (ref >60)
GFR calc non Af Amer: 38 mL/min — ABNORMAL LOW (ref >60)
Glucose, Bld: 139 mg/dL — ABNORMAL HIGH (ref 70–99)
Potassium: 3.8 mmol/L (ref 3.5–5.1)
Sodium: 132 mmol/L — ABNORMAL LOW (ref 135–145)

## 2020-02-15 LAB — URINALYSIS, COMPLETE (UACMP) WITH MICROSCOPIC
Bacteria, UA: NONE SEEN
Bilirubin Urine: NEGATIVE
Glucose, UA: NEGATIVE mg/dL
Ketones, ur: NEGATIVE mg/dL
Nitrite: NEGATIVE
Protein, ur: 100 mg/dL — AB
Specific Gravity, Urine: 1.013 (ref 1.005–1.030)
WBC, UA: >50 WBC/hpf — ABNORMAL HIGH (ref 0–5)
pH: 6 (ref 5.0–8.0)

## 2020-02-15 LAB — URINE CULTURE: Culture: >=100000 — AB

## 2020-02-15 LAB — HEPATIC FUNCTION PANEL
ALT: 25 U/L (ref 0–44)
AST: 22 U/L (ref 15–41)
Albumin: 3.7 g/dL (ref 3.5–5.0)
Alkaline Phosphatase: 74 U/L (ref 38–126)
Bilirubin, Direct: 0.2 mg/dL (ref 0.0–0.2)
Indirect Bilirubin: 0.7 mg/dL (ref 0.3–0.9)
Total Bilirubin: 0.9 mg/dL (ref 0.3–1.2)
Total Protein: 6.7 g/dL (ref 6.5–8.1)

## 2020-02-15 LAB — CK: Total CK: 31 U/L — ABNORMAL LOW (ref 49–397)

## 2020-02-15 LAB — PSA: Prostatic Specific Antigen: 15.74 ng/mL — ABNORMAL HIGH (ref 0.00–4.00)

## 2020-02-15 MED ORDER — GUAIFENESIN-DM 100-10 MG/5ML PO SYRP
10.0000 mL | ORAL_SOLUTION | Freq: Four times a day (QID) | ORAL | Status: DC | PRN
  Filled 2020-02-15: qty 10

## 2020-02-15 MED ORDER — ONDANSETRON 4 MG PO TBDP
4.0000 mg | ORAL_TABLET | Freq: Three times a day (TID) | ORAL | Status: DC | PRN
  Filled 2020-02-15: qty 1

## 2020-02-15 MED ORDER — CALCIUM CARBONATE ANTACID 500 MG PO CHEW: 1.0000 | CHEWABLE_TABLET | Freq: Three times a day (TID) | ORAL | Status: DC | PRN

## 2020-02-15 MED ORDER — ONDANSETRON HCL 4 MG/2ML IJ SOLN: 4.0000 mg | Freq: Four times a day (QID) | INTRAMUSCULAR | Status: DC | PRN

## 2020-02-15 MED ORDER — POLYETHYLENE GLYCOL 3350 17 G PO PACK
17.0000 g | PACK | Freq: Two times a day (BID) | ORAL | Status: DC | PRN
  Administered 2020-02-16: 17 g via ORAL
  Filled 2020-02-15: qty 1

## 2020-02-15 MED ORDER — LABETALOL HCL 100 MG PO TABS
100.0000 mg | ORAL_TABLET | Freq: Every day | ORAL | Status: DC
  Administered 2020-02-16: 100 mg via ORAL
  Filled 2020-02-15 (×2): qty 1

## 2020-02-15 MED ORDER — ALUM & MAG HYDROXIDE-SIMETH 200-200-20 MG/5ML PO SUSP: 15.0000 mL | Freq: Four times a day (QID) | ORAL | Status: DC | PRN

## 2020-02-15 MED ORDER — SODIUM CHLORIDE 0.9 % IV SOLN
1.0000 g | Freq: Once | INTRAVENOUS | Status: AC
  Administered 2020-02-15: 1 g via INTRAVENOUS
  Filled 2020-02-15: qty 10

## 2020-02-15 MED ORDER — TAMSULOSIN HCL 0.4 MG PO CAPS
0.4000 mg | ORAL_CAPSULE | Freq: Two times a day (BID) | ORAL | Status: DC
  Administered 2020-02-15 – 2020-02-17 (×4): 0.4 mg via ORAL
  Filled 2020-02-15 (×4): qty 1

## 2020-02-15 MED ORDER — ENOXAPARIN SODIUM 40 MG/0.4ML ~~LOC~~ SOLN
40.0000 mg | SUBCUTANEOUS | Status: DC
  Administered 2020-02-15 – 2020-02-16 (×2): 40 mg via SUBCUTANEOUS
  Filled 2020-02-15 (×2): qty 0.4

## 2020-02-15 MED ORDER — ACETAMINOPHEN 500 MG PO TABS
1000.0000 mg | ORAL_TABLET | Freq: Three times a day (TID) | ORAL | Status: DC | PRN
  Administered 2020-02-16 (×2): 1000 mg via ORAL
  Filled 2020-02-15 (×2): qty 2

## 2020-02-15 MED ORDER — DOCUSATE SODIUM 100 MG PO CAPS
100.0000 mg | ORAL_CAPSULE | Freq: Two times a day (BID) | ORAL | Status: DC | PRN
  Administered 2020-02-16: 100 mg via ORAL
  Filled 2020-02-15: qty 1

## 2020-02-15 MED ORDER — SODIUM CHLORIDE 0.9% FLUSH: 3.0000 mL | Freq: Once | INTRAVENOUS | Status: DC

## 2020-02-15 MED ORDER — TRAZODONE HCL 50 MG PO TABS
50.0000 mg | ORAL_TABLET | Freq: Every day | ORAL | Status: DC
  Administered 2020-02-15 – 2020-02-16 (×2): 50 mg via ORAL
  Filled 2020-02-15 (×2): qty 1

## 2020-02-15 MED ORDER — LACTATED RINGERS IV BOLUS
1000.0000 mL | Freq: Once | INTRAVENOUS | Status: AC
  Administered 2020-02-15: 1000 mL via INTRAVENOUS

## 2020-02-15 NOTE — ED Triage Notes (Signed)
Pt here from home via [facility] with c/o weakness, abdominal pain and lightheadedness. Pt states his PCP sent him over here and he might need toget his catheter out.  Pt states he has been taking antibiotics for UTI.  Pt has had abdominal pain for 2 days now.

## 2020-02-15 NOTE — H&P (Signed)
History and Physical    Jeffrey Dennis [id number] DOB: 1930/07/06 DOA: 02/15/2020  PCP: [name], MD  Patient coming from: home  I have personally briefly reviewed patient's old medical records in [place]  Chief Complaint: want the Foley out  HPI: Jeffrey Dennis is a 86 y.o. Caucasian male with medical history significant of BPH s/p TURP, HTN who presented wanting his Foley removed.  Pt reported the reason he presented to the hospital was because he wanted the Foley out (which was place during his ED visit on 02/13/20).  Pt denied fever, abdominal pain, dysuria.  Only current complaint was weakness, which has been progressive for the past several months.  Pt said since he was discharged from the ED, he basically sat in his recliner the whole time.  Pt is wheel chair-bound at baseline, lives by himself, with daughters coming in to help.    Back on 02/13/20, pt presented to the ED because something about an inch long and grayish came out of his penis, and felt like he may be retaining.  ED note said pt had 863 ml in bladder scan, so Foley was inserted and pt was discharged from the ED with the Foley, and a course of Bactrim for empiric tx of cystitis (based on UA finding).  CT stone study in the ED visit showed "Marked asymmetric thickening of the right-sided bladder which is contiguous with an enlarged and lobulated prostate."  Pt has a complicated urological hx, but hasn't seen a urologist recently.  Pt said he is not interested in going to a facility.  ED Course: initial vitals: afebrile, pulse 92, BP 145/66, sating 94% on room air.  Labs notable for WBC 10.8, Cr 1.61 (baseline 1.3).  Pt received 1L LR and Ceftriaxone in the ED.    Assessment/Plan Principal Problem:   Urinary retention due to benign prostatic hyperplasia   # Urinary retention due to BPH s/p Foley insertion # Hx of BPH s/p TURP --Pt was discharged from ED on 4/3 with the Foley.  Pt presented today for the  purpose of Foley removal. PLAN: --obtain PSA --Will remove Foley tomorrow morning and do voiding trial. --continue home Flomax  # Asymptomatic pyuria --Pt was discharged from ED on 4/3 on a course of Bactrim based on UA.  Urine cx subsequently grew E coli resistant to Bactrim.  However, pt had no fever, abdominal pain, dysuria, and mildly elevated WBC count seems to be baseline.   --Pt received ceftriaxone in the ED. PLAN: --Hold further abx.  No need to treat since pt has no symptoms of UTI.  # Weakness --Pt's main complaint, which has been progressive for months.  Limited mobility at baseline, and has been sitting in recliner most days.  Pt is of advanced age of 31, could be natural decline. PLAN: --PT  # HTN --continue home labetalol   DVT prophylaxis: Lovenox SQ Code Status: Full code  Family Communication:   Disposition Plan: Likely home  Consults called: none Admission status: Inpatient   Review of Systems: As per HPI otherwise 10 point review of systems negative.   Past Medical History:  Diagnosis Date  . Arthritis   . Bilateral hydronephrosis   . GERD (gastroesophageal reflux disease)   . Hearing decreased   . History of hepatitis A   . Hypertension   . Incontinence of urine     Past Surgical History:  Procedure Laterality Date  . CYSTOSCOPY W/ RETROGRADES  10/21/2012  Procedure: CYSTOSCOPY WITH RETROGRADE PYELOGRAM;  Surgeon: Malka So, MD;  Location: [facility];  Service: Urology;  Laterality: Bilateral;  cystoscopy bilateral retrograde  ureteral dilatation    . NOSE SURGERY  1959  . PROSTATE SURGERY    . PROSTATECTOMY  2004  . TONSILLECTOMY    . TRANSURETHRAL RESECTION OF PROSTATE  10/21/2012   Procedure: TRANSURETHRAL RESECTION OF THE PROSTATE (TURP);  Surgeon: Malka So, MD;  Location: [facility];  Service: Urology;  Laterality: N/A;     reports that he has never smoked. He has never used smokeless tobacco. He reports that he does not drink alcohol or  use drugs.  No Known Allergies  Family History  Problem Relation Age of Onset  . Stroke Father   . Heart attack Father   . Stroke Mother     Prior to Admission medications   Medication Sig Start Date End Date Taking? Authorizing Provider  labetalol (NORMODYNE) 100 MG tablet Take 100 mg by mouth daily in the afternoon.  02/09/20  Yes [provider]  meloxicam (MOBIC) 7.5 MG tablet Take 1 tablet (7.5 mg total) by mouth daily. 02/13/20 02/12/21 Yes Laban Emperor, PA-C  sulfamethoxazole-trimethoprim (BACTRIM DS) 800-160 MG tablet Take 1 tablet by mouth 2 (two) times daily. 02/13/20  Yes Laban Emperor, PA-C  tamsulosin (FLOMAX) 0.4 MG CAPS capsule Take 0.4 mg by mouth 2 (two) times daily. 01/27/20  Yes [provider]  traMADol (ULTRAM) 50 MG tablet Take 1 tablet (50 mg total) by mouth every 6 (six) hours as needed. Patient taking differently: Take 50 mg by mouth 2 (two) times daily.  10/25/12  Yes Alexis Frock, MD  traZODone (DESYREL) 50 MG tablet Take 50 mg by mouth at bedtime. 01/18/20  Yes [provider]  cephALEXin (KEFLEX) 250 MG capsule Take 1 capsule (250 mg total) by mouth 2 (two) times daily. X 6 days Patient not taking: Reported on 02/15/2020 10/25/12   Alexis Frock, MD    Physical Exam: Vitals:   02/15/20 1511 02/15/20 1530 02/15/20 1600 02/15/20 1716  BP: 137/68 (!) 141/60 (!) 154/73 (!) 158/68  Pulse: 73 70 69 73  Resp: 16  16 20   Temp:    97.8 F (36.6 C)  TempSrc:    Oral  SpO2: 97%  95% 94%  Weight:      Height:        Constitutional: NAD, AAOx3 HEENT: conjunctivae and lids normal, EOMI, severely hard of hearing CV: RRR no M,R,G. Distal pulses +2.  No cyanosis.   RESP: CTA B/L, normal respiratory effort  GI: +BS, NTND Extremities: Long socks on, no effusions, edema, or tenderness in BLE SKIN: warm, dry and intact Neuro: II - XII grossly intact.  Sensation intact Psych: Normal mood and affect.  Appropriate judgement and reason Foley  outputting yellow urine.   Labs on Admission: I have personally reviewed following labs and imaging studies  CBC: Recent Labs  Lab 02/13/20 0754 02/15/20 1148  WBC 13.1* 10.8*  NEUTROABS 9.3*  --   HGB 13.9 13.5  HCT 42.6 41.0  MCV 93.4 93.6  PLT 198 [id number]   Basic Metabolic Panel: Recent Labs  Lab 02/13/20 0754 02/15/20 1148  NA 137 132*  K 4.1 3.8  CL 102 103  CO2 25 24  GLUCOSE 115* 139*  BUN 22 25*  CREATININE 1.28* 1.61*  CALCIUM 11.6* 11.3*   GFR: Estimated Creatinine Clearance: 38.7 mL/min (A) (by C-G formula based on SCr of 1.61 mg/dL (H)).  Liver Function Tests: Recent Labs  Lab 02/15/20 1148  AST 22  ALT 25  ALKPHOS 74  BILITOT 0.9  PROT 6.7  ALBUMIN 3.7   No results for input(s): LIPASE, AMYLASE in the last 168 hours. No results for input(s): AMMONIA in the last 168 hours. Coagulation Profile: No results for input(s): INR, PROTIME in the last 168 hours. Cardiac Enzymes: Recent Labs  Lab 02/15/20 1148  CKTOTAL 31*   BNP (last 3 results) No results for input(s): PROBNP in the last 8760 hours. HbA1C: No results for input(s): HGBA1C in the last 72 hours. CBG: No results for input(s): GLUCAP in the last 168 hours. Lipid Profile: No results for input(s): CHOL, HDL, LDLCALC, TRIG, CHOLHDL, LDLDIRECT in the last 72 hours. Thyroid Function Tests: No results for input(s): TSH, T4TOTAL, FREET4, T3FREE, THYROIDAB in the last 72 hours. Anemia Panel: No results for input(s): VITAMINB12, FOLATE, FERRITIN, TIBC, IRON, RETICCTPCT in the last 72 hours. Urine analysis:    Component Value Date/Time   COLORURINE YELLOW (A) 02/15/2020 1304   APPEARANCEUR CLOUDY (A) 02/15/2020 1304   LABSPEC 1.013 02/15/2020 1304   PHURINE 6.0 02/15/2020 1304   GLUCOSEU NEGATIVE 02/15/2020 1304   HGBUR SMALL (A) 02/15/2020 1304   BILIRUBINUR NEGATIVE 02/15/2020 1304   KETONESUR NEGATIVE 02/15/2020 1304   PROTEINUR 100 (A) 02/15/2020 1304   UROBILINOGEN 0.2 10/23/2012 1620    NITRITE NEGATIVE 02/15/2020 1304   LEUKOCYTESUR LARGE (A) 02/15/2020 1304    Radiological Exams on Admission: DG Chest 2 View  Result Date: 02/15/2020 CLINICAL DATA:  Weakness. EXAM: CHEST - 2 VIEW COMPARISON:  October 23, 2012. FINDINGS: The heart size and mediastinal contours are within normal limits. Both lungs are clear. The visualized skeletal structures are unremarkable. IMPRESSION: No active cardiopulmonary disease. Electronically Signed   By: Marijo Conception M.D.   On: 02/15/2020 14:01   CT Head Wo Contrast  Result Date: 02/15/2020 CLINICAL DATA:  Altered mental status and dizziness EXAM: CT HEAD WITHOUT CONTRAST TECHNIQUE: Contiguous axial images were obtained from the base of the skull through the vertex without intravenous contrast. COMPARISON:  None. FINDINGS: Brain: There is moderate diffuse atrophy. There is no intracranial mass, hemorrhage, extra-axial fluid collection, or midline shift. There is mild small vessel disease in the centra semiovale bilaterally. Elsewhere brain parenchyma appears unremarkable. No acute infarct evident. Vascular: There is no appreciable hyperdense vessel. There is calcification in each carotid siphon region. Skull: Bony calvarium appears intact. Sinuses/Orbits: There is mucosal thickening in multiple ethmoid air cells. There is a small air-fluid level in the posterior sphenoid sinus region. Other visualized paranasal sinuses are clear. Orbits appear symmetric bilaterally. Other: Mastoid air cells are clear. IMPRESSION: Atrophy with periventricular small vessel disease. No evident acute infarct. No mass or hemorrhage. There are foci of arterial vascular calcification. There are foci of paranasal sinus disease. Electronically Signed   By: Lowella Grip III M.D.   On: 02/15/2020 14:07      Enzo Bi MD [facility]  If 7PM-7AM, please contact night-coverage 02/15/2020, 7:22 PM

## 2020-02-15 NOTE — ED Notes (Signed)
Pt denies n/v/d and states that he had abdominal pain but that he no longer has it. Pt states he's been feeling more weak recently and that he's concerned with his catheter.

## 2020-02-15 NOTE — ED Provider Notes (Signed)
[facility] Emergency Department Provider Note   ____________________________________________   First MD Initiated Contact with Patient 02/15/20 1250     (approximate)  I have reviewed the triage vital signs and the nursing notes.   HISTORY  Chief Complaint Weakness    HPI Jeffrey Dennis is a 86 y.o. male with possible history of hypertension and GERD who presents to the ED for generalized weakness.  Patient was seen in the ED 2 days ago for difficulty urinating, found to have urinary retention likely secondary to UTI and started on Bactrim.  He had a Foley catheter placed at that time but per daughter has been having lots of issues using his catheter.  He has been much weaker than usual and seemed confused at times over the past couple of days.  According to his daughter, he has spent the last 48 hours almost entirely in his recliner, unable to walk.  She has not noticed any fevers and he has not complained of any abdominal pain or flank pain.  Daughter is unsure whether he is has been consistently taking his antibiotic.        Past Medical History:  Diagnosis Date  . Arthritis   . Bilateral hydronephrosis   . GERD (gastroesophageal reflux disease)   . Hearing decreased   . History of hepatitis A   . Hypertension   . Incontinence of urine     Patient Active Problem List   Diagnosis Date Noted  . UTI (urinary tract infection) 02/15/2020  . Gastritis 10/24/2012  . Acute on chronic renal failure ([place]) 10/23/2012  . Encephalopathy acute 10/23/2012  . Leukocytosis 10/23/2012    Past Surgical History:  Procedure Laterality Date  . CYSTOSCOPY W/ RETROGRADES  10/21/2012   Procedure: CYSTOSCOPY WITH RETROGRADE PYELOGRAM;  Surgeon: Malka So, MD;  Location: [facility];  Service: Urology;  Laterality: Bilateral;  cystoscopy bilateral retrograde  ureteral dilatation    . NOSE SURGERY  1959  . PROSTATE SURGERY    . PROSTATECTOMY  2004  . TONSILLECTOMY     . TRANSURETHRAL RESECTION OF PROSTATE  10/21/2012   Procedure: TRANSURETHRAL RESECTION OF THE PROSTATE (TURP);  Surgeon: Malka So, MD;  Location: [facility];  Service: Urology;  Laterality: N/A;    Prior to Admission medications   Medication Sig Start Date End Date Taking? Authorizing Provider  labetalol (NORMODYNE) 100 MG tablet Take 100 mg by mouth daily in the afternoon.  02/09/20  Yes [provider]  meloxicam (MOBIC) 7.5 MG tablet Take 1 tablet (7.5 mg total) by mouth daily. 02/13/20 02/12/21 Yes Laban Emperor, PA-C  sulfamethoxazole-trimethoprim (BACTRIM DS) 800-160 MG tablet Take 1 tablet by mouth 2 (two) times daily. 02/13/20  Yes Laban Emperor, PA-C  tamsulosin (FLOMAX) 0.4 MG CAPS capsule Take 0.4 mg by mouth 2 (two) times daily. 01/27/20  Yes [provider]  traMADol (ULTRAM) 50 MG tablet Take 1 tablet (50 mg total) by mouth every 6 (six) hours as needed. Patient taking differently: Take 50 mg by mouth 2 (two) times daily.  10/25/12  Yes Alexis Frock, MD  traZODone (DESYREL) 50 MG tablet Take 50 mg by mouth at bedtime. 01/18/20  Yes [provider]  cephALEXin (KEFLEX) 250 MG capsule Take 1 capsule (250 mg total) by mouth 2 (two) times daily. X 6 days Patient not taking: Reported on 02/15/2020 10/25/12   Alexis Frock, MD    Allergies Patient has no known allergies.  No family history on file.  Social History Social History   Tobacco Use  . Smoking status: Never Smoker  Substance Use Topics  . Alcohol use: No  . Drug use: No    Review of Systems  Constitutional: No fever/chills.  Positive for generalized weakness. Eyes: No visual changes. ENT: No sore throat. Cardiovascular: Denies chest pain. Respiratory: Denies shortness of breath. Gastrointestinal: No abdominal pain.  No nausea, no vomiting.  No diarrhea.  No constipation. Genitourinary: Negative for dysuria. Musculoskeletal: Negative for back pain. Skin: Negative for  rash. Neurological: Negative for headaches, focal weakness or numbness.  ____________________________________________   PHYSICAL EXAM:  VITAL SIGNS: ED Triage Vitals  Enc Vitals Group     BP 02/15/20 1145 (!) 145/66     Pulse Rate 02/15/20 1145 92     Resp 02/15/20 1145 18     Temp 02/15/20 1145 98.1 F (36.7 C)     Temp src --      SpO2 02/15/20 1145 94 %     Weight 02/15/20 1146 218 lb (98.9 kg)     Height 02/15/20 1146 5\' 11"  (1.803 m)     Head Circumference --      Peak Flow --      Pain Score 02/15/20 1146 0     Pain Loc --      Pain Edu? --      Excl. in [place]? --     Constitutional: Alert and oriented. Eyes: Conjunctivae are normal. Head: Atraumatic. Nose: No congestion/rhinnorhea. Mouth/Throat: Mucous membranes are moist. Neck: Normal ROM Cardiovascular: Normal rate, regular rhythm. Grossly normal heart sounds. Respiratory: Normal respiratory effort.  No retractions. Lungs CTAB. Gastrointestinal: Soft and nontender. No distention. Genitourinary: Foley catheter in place with no leakage, free flow of urine noted. Musculoskeletal: No lower extremity tenderness nor edema. Neurologic:  Normal speech and language. No gross focal neurologic deficits are appreciated. Skin:  Skin is warm, dry and intact. No rash noted. Psychiatric: Mood and affect are normal. Speech and behavior are normal.  ____________________________________________   LABS (all labs ordered are listed, but only abnormal results are displayed)  Labs Reviewed  BASIC METABOLIC PANEL - Abnormal; Notable for the following components:      Result Value   Sodium 132 (*)    Glucose, Bld 139 (*)    BUN 25 (*)    Creatinine, Ser 1.61 (*)    Calcium 11.3 (*)    GFR calc non Af Amer 38 (*)    GFR calc Af Amer 44 (*)    All other components within normal limits  CBC - Abnormal; Notable for the following components:   WBC 10.8 (*)    All other components within normal limits  URINALYSIS, COMPLETE  (UACMP) WITH MICROSCOPIC - Abnormal; Notable for the following components:   Color, Urine YELLOW (*)    APPearance CLOUDY (*)    Hgb urine dipstick SMALL (*)    Protein, ur 100 (*)    Leukocytes,Ua LARGE (*)    WBC, UA >50 (*)    All other components within normal limits  CK - Abnormal; Notable for the following components:   Total CK 31 (*)    All other components within normal limits  SARS CORONAVIRUS 2 (TAT 6-24 HRS)  HEPATIC FUNCTION PANEL  CBG MONITORING, ED   ____________________________________________  EKG  ED ECG REPORT I, Blake Divine, the attending physician, personally viewed and interpreted this ECG.   Date: 02/15/2020  EKG Time: 11:51  Rate: 90  Rhythm: normal sinus rhythm  Axis: Normal  Intervals:right bundle branch block  ST&T Change: None   PROCEDURES  Procedure(s) performed (including Critical Care):  .1-3 Lead EKG Interpretation Performed by: Blake Divine, MD Authorized by: Blake Divine, MD     Interpretation: normal     ECG rate:  88   ECG rate assessment: normal     Rhythm: sinus rhythm     Ectopy: none     Conduction: normal       ____________________________________________   INITIAL IMPRESSION / ASSESSMENT AND PLAN / ED COURSE       86 year old male presents to the ED for generalized weakness and difficulty caring for himself following recent diagnosis of UTI and urinary retention.  His Foley catheter seems to be functioning well at this time with no evidence of urinary retention.  There are signs of ongoing infection on his UA and culture from 2 days ago shows E. coli resistant to Bactrim.  It is however sensitive to Rocephin and we will give dose of this here in the ED.  Remainder of his lab work is reassuring and CT head negative for acute process.  CK within normal limits, no evidence of rhabdomyolysis.  Patient was hydrated with IV fluids and we will admit for further treatment of UTI given his generalized weakness and  failure of outpatient treatment.      ____________________________________________   FINAL CLINICAL IMPRESSION(S) / ED DIAGNOSES  Final diagnoses:  Acute cystitis without hematuria  Generalized weakness  Urinary retention     ED Discharge Orders    None       Note:  This document was prepared using Dragon voice recognition software and may include unintentional dictation errors.   Blake Divine, MD 02/15/20 [phone]

## 2020-02-16 LAB — CBC
HCT: 40.5 % (ref 39.0–52.0)
Hemoglobin: 13.2 g/dL (ref 13.0–17.0)
MCH: 30.7 pg (ref 26.0–34.0)
MCHC: 32.6 g/dL (ref 30.0–36.0)
MCV: 94.2 fL (ref 80.0–100.0)
Platelets: 195 10*3/uL (ref 150–400)
RBC: 4.3 MIL/uL (ref 4.22–5.81)
RDW: 13.2 % (ref 11.5–15.5)
WBC: 11.3 10*3/uL — ABNORMAL HIGH (ref 4.0–10.5)
nRBC: 0 % (ref 0.0–0.2)

## 2020-02-16 LAB — SARS CORONAVIRUS 2 (TAT 6-24 HRS): SARS Coronavirus 2: NEGATIVE

## 2020-02-16 LAB — BASIC METABOLIC PANEL
Anion gap: 7 (ref 5–15)
BUN: 25 mg/dL — ABNORMAL HIGH (ref 8–23)
CO2: 24 mmol/L (ref 22–32)
Calcium: 11.3 mg/dL — ABNORMAL HIGH (ref 8.9–10.3)
Chloride: 104 mmol/L (ref 98–111)
Creatinine, Ser: 1.48 mg/dL — ABNORMAL HIGH (ref 0.61–1.24)
GFR calc Af Amer: 49 mL/min — ABNORMAL LOW (ref >60)
GFR calc non Af Amer: 42 mL/min — ABNORMAL LOW (ref >60)
Glucose, Bld: 100 mg/dL — ABNORMAL HIGH (ref 70–99)
Potassium: 3.9 mmol/L (ref 3.5–5.1)
Sodium: 135 mmol/L (ref 135–145)

## 2020-02-16 LAB — MAGNESIUM: Magnesium: 2 mg/dL (ref 1.7–2.4)

## 2020-02-16 MED ORDER — CHLORHEXIDINE GLUCONATE CLOTH 2 % EX PADS: 6.0000 | MEDICATED_PAD | Freq: Every day | CUTANEOUS | Status: DC

## 2020-02-16 MED ORDER — MAGNESIUM HYDROXIDE 400 MG/5ML PO SUSP
15.0000 mL | Freq: Every day | ORAL | Status: DC | PRN
  Filled 2020-02-16: qty 30

## 2020-02-16 MED ORDER — POLYETHYLENE GLYCOL 3350 17 G PO PACK
17.0000 g | PACK | Freq: Two times a day (BID) | ORAL | Status: DC
  Administered 2020-02-16 – 2020-02-17 (×2): 17 g via ORAL
  Filled 2020-02-16 (×2): qty 1

## 2020-02-16 NOTE — Progress Notes (Signed)
PROGRESS NOTE    Jeffrey Dennis  [id number] DOB: 07-Dec-1929 DOA: 02/15/2020 PCP: [name], MD    Assessment & Plan:   Principal Problem:   Urinary retention due to benign prostatic hyperplasia    Jeffrey Dennis is a 86 y.o. Caucasian male with medical history significant of BPH s/p TURP, HTN who presented wanting his Foley removed.   # Urinary retention due to BPH s/p Foley insertion # Hx of BPH s/p TURP --Pt was discharged from ED on 4/3 with the Foley.  Pt presented for the purpose of Foley removal. PLAN: --remove Foley and start voiding trial with bladder scan.   --continue home Flomax  # Elevated PSA --CT renal stone on 02/13/20 showed "Marked asymmetric thickening of the right-sided bladder which is contiguous with an enlarged and lobulated prostate."   --PSA 15.74. --Will need outpatient urology referral and followup  # Asymptomatic pyuria --Pt was discharged from ED on 4/3 on a course of Bactrim based on UA.  Urine cx subsequently grew E coli resistant to Bactrim.  However, pt had no fever, abdominal pain, dysuria, and mildly elevated WBC count seems to be baseline.   --Pt received ceftriaxone in the ED. PLAN: --Hold further abx.  No need to treat since pt has no symptoms of UTI.  # Weakness and loss of appetite --Pt's main complaints, which has been progressive for months.  Limited mobility at baseline, and has been sitting in recliner most days.  Pt is of advanced age of 15, could be natural decline, or cancer, or inactivity. PLAN: --PT today, rec SNF rehab  # HTN --continue home labetalol   DVT prophylaxis: Lovenox SQ Code Status: Full code  Family Communication: Daughter updated on the phone today Disposition Plan: If passing voiding trial, can discharge tomorrow, to SNF rehab when bed available.   Subjective and Interval History:  Pt reported his abdomen wasn't right, because he didn't feel like eating foods he used to like.  Reported 20 lbs  weight loss.  No fever, dyspnea, chest pain, abdominal pain, N/V/D, dysuria.  Has not BM in about a week.  Voiding trial today.   Objective: Vitals:   02/16/20 0010 02/16/20 0600 02/16/20 0749 02/16/20 1501  BP: (!) 121/48  (!) 120/54 (!) 143/70  Pulse:   84 83  Resp: (!) 22 15  18   Temp: 98.4 F (36.9 C)  98.3 F (36.8 C) 97.6 F (36.4 C)  TempSrc: Oral  Oral Oral  SpO2: 94%  94%   Weight:      Height:        Intake/Output Summary (Last 24 hours) at 02/16/2020 1825 Last data filed at 02/16/2020 1359 Gross per 24 hour  Intake 500 ml  Output 2850 ml  Net -2350 ml   Filed Weights   02/15/20 1146  Weight: 98.9 kg    Examination:   Constitutional: NAD, AAOx3 HEENT: conjunctivae and lids normal, EOMI, severely hard of hearing CV: RRR no M,R,G. Distal pulses +2.  No cyanosis.   RESP: CTA B/L, normal respiratory effort  GI: +BS, NTND Extremities: Long socks on, no effusions, edema, or tenderness in BLE SKIN: warm, dry and intact Neuro: II - XII grossly intact.  Sensation intact Psych: Normal mood and affect.  Appropriate judgement and reason   Data Reviewed: I have personally reviewed following labs and imaging studies  CBC: Recent Labs  Lab 02/13/20 0754 02/15/20 1148 02/16/20 0614  WBC 13.1* 10.8* 11.3*  NEUTROABS 9.3*  --   --  HGB 13.9 13.5 13.2  HCT 42.6 41.0 40.5  MCV 93.4 93.6 94.2  PLT 198 212 0000000   Basic Metabolic Panel: Recent Labs  Lab 02/13/20 0754 02/15/20 1148 02/16/20 0614  NA 137 132* 135  K 4.1 3.8 3.9  CL 102 103 104  CO2 25 24 24   GLUCOSE 115* 139* 100*  BUN 22 25* 25*  CREATININE 1.28* 1.61* 1.48*  CALCIUM 11.6* 11.3* 11.3*  MG  --   --  2.0   GFR: Estimated Creatinine Clearance: 42.1 mL/min (A) (by C-G formula based on SCr of 1.48 mg/dL (H)). Liver Function Tests: Recent Labs  Lab 02/15/20 1148  AST 22  ALT 25  ALKPHOS 74  BILITOT 0.9  PROT 6.7  ALBUMIN 3.7   No results for input(s): LIPASE, AMYLASE in the last 168  hours. No results for input(s): AMMONIA in the last 168 hours. Coagulation Profile: No results for input(s): INR, PROTIME in the last 168 hours. Cardiac Enzymes: Recent Labs  Lab 02/15/20 1148  CKTOTAL 31*   BNP (last 3 results) No results for input(s): PROBNP in the last 8760 hours. HbA1C: No results for input(s): HGBA1C in the last 72 hours. CBG: No results for input(s): GLUCAP in the last 168 hours. Lipid Profile: No results for input(s): CHOL, HDL, LDLCALC, TRIG, CHOLHDL, LDLDIRECT in the last 72 hours. Thyroid Function Tests: No results for input(s): TSH, T4TOTAL, FREET4, T3FREE, THYROIDAB in the last 72 hours. Anemia Panel: No results for input(s): VITAMINB12, FOLATE, FERRITIN, TIBC, IRON, RETICCTPCT in the last 72 hours. Sepsis Labs: No results for input(s): PROCALCITON, LATICACIDVEN in the last 168 hours.  Recent Results (from the past 240 hour(s))  Urine culture     Status: Abnormal   Collection Time: 02/13/20  4:32 AM   Specimen: Urine, Random  Result Value Ref Range Status   Specimen Description   Final    URINE, RANDOM Performed at [facility], [address] [place], [place] [address]    Special Requests   Final    NONE Performed at [facility], [place], [place] [address]    Culture >=100,000 COLONIES/mL ESCHERICHIA COLI (A)  Final   Report Status 02/15/2020 FINAL  Final   Organism ID, Bacteria ESCHERICHIA COLI (A)  Final      Susceptibility   Escherichia coli - MIC*    AMPICILLIN >=32 RESISTANT Resistant     CEFAZOLIN 8 SENSITIVE Sensitive     CEFTRIAXONE <=0.25 SENSITIVE Sensitive     CIPROFLOXACIN >=4 RESISTANT Resistant     GENTAMICIN >=16 RESISTANT Resistant     IMIPENEM <=0.25 SENSITIVE Sensitive     NITROFURANTOIN <=16 SENSITIVE Sensitive     TRIMETH/SULFA >=320 RESISTANT Resistant     AMPICILLIN/SULBACTAM >=32 RESISTANT Resistant     PIP/TAZO 64 INTERMEDIATE Intermediate     * >=100,000 COLONIES/mL  ESCHERICHIA COLI  SARS CORONAVIRUS 2 (TAT 6-24 HRS) Nasopharyngeal Nasopharyngeal Swab     Status: None   Collection Time: 02/15/20  2:39 PM   Specimen: Nasopharyngeal Swab  Result Value Ref Range Status   SARS Coronavirus 2 NEGATIVE NEGATIVE Final    Comment: (NOTE) SARS-CoV-2 target nucleic acids are NOT DETECTED. The SARS-CoV-2 RNA is generally detectable in upper and lower respiratory specimens during the acute phase of infection. Negative results do not preclude SARS-CoV-2 infection, do not rule out co-infections with other pathogens, and should not be used as the sole basis for treatment or other patient management decisions. Negative results must  be combined with clinical observations, patient history, and epidemiological information. The expected result is Negative. Fact Sheet for Patients: SugarRoll.be Fact Sheet for Healthcare Providers: https://www.woods-mathews.com/ This test is not yet approved or cleared by the [place] FDA and  has been authorized for detection and/or diagnosis of SARS-CoV-2 by FDA under an Emergency Use Authorization (EUA). This EUA will remain  in effect (meaning this test can be used) for the duration of the COVID-19 declaration under Section 56 4(b)(1) of the Act, 21 U.S.C. section 360bbb-3(b)(1), unless the authorization is terminated or revoked sooner. Performed at [facility], [place] [address], [place], [place] [address]       Radiology Studies: DG Chest 2 View  Result Date: 02/15/2020 CLINICAL DATA:  Weakness. EXAM: CHEST - 2 VIEW COMPARISON:  October 23, 2012. FINDINGS: The heart size and mediastinal contours are within normal limits. Both lungs are clear. The visualized skeletal structures are unremarkable. IMPRESSION: No active cardiopulmonary disease. Electronically Signed   By: Marijo Conception M.D.   On: 02/15/2020 14:01   CT Head Wo Contrast  Result Date: 02/15/2020 CLINICAL DATA:   Altered mental status and dizziness EXAM: CT HEAD WITHOUT CONTRAST TECHNIQUE: Contiguous axial images were obtained from the base of the skull through the vertex without intravenous contrast. COMPARISON:  None. FINDINGS: Brain: There is moderate diffuse atrophy. There is no intracranial mass, hemorrhage, extra-axial fluid collection, or midline shift. There is mild small vessel disease in the centra semiovale bilaterally. Elsewhere brain parenchyma appears unremarkable. No acute infarct evident. Vascular: There is no appreciable hyperdense vessel. There is calcification in each carotid siphon region. Skull: Bony calvarium appears intact. Sinuses/Orbits: There is mucosal thickening in multiple ethmoid air cells. There is a small air-fluid level in the posterior sphenoid sinus region. Other visualized paranasal sinuses are clear. Orbits appear symmetric bilaterally. Other: Mastoid air cells are clear. IMPRESSION: Atrophy with periventricular small vessel disease. No evident acute infarct. No mass or hemorrhage. There are foci of arterial vascular calcification. There are foci of paranasal sinus disease. Electronically Signed   By: Lowella Grip III M.D.   On: 02/15/2020 14:07     Scheduled Meds: . Chlorhexidine Gluconate Cloth  6 each Topical Daily  . enoxaparin (LOVENOX) injection  40 mg Subcutaneous Q24H  . labetalol  100 mg Oral Q1500  . polyethylene glycol  17 g Oral BID  . sodium chloride flush  3 mL Intravenous Once  . tamsulosin  0.4 mg Oral BID  . traZODone  50 mg Oral QHS   Continuous Infusions:   LOS: 1 day     Enzo Bi, MD [facility] If 7PM-7AM, please contact night-coverage 02/16/2020, 6:25 PM

## 2020-02-16 NOTE — Evaluation (Signed)
Physical Therapy Evaluation Patient Details Name: Jeffrey Dennis MRN: [id number] DOB: 30-Nov-1929 Today's Date: 02/16/2020   History of Present Illness  Pt is an 86 y.o. male presenting to hospital 02/15/20 with generalized weakness and noted to also be confused at times.  Of note, pt seen in ED 2 days prior for difficulty urinating (pt with urinary retention likely secondary UTI--foley catheter placed and pt discharged home); pt sat in recliner mostly since this ED discharge.  Pt now admitted with urinary retention d/t BPH s/p foley insertion, h/o BPH s/p TURP, asymptomatic pyuria, and weakness.  PMH includes Hep A, htn, prostate surgery.  Clinical Impression  Prior to hospital admission, pt was ambulatory household distances with rollator and used manual w/c in community.  Lives alone but has daughters that come to assist him.  Currently pt is max assist semi-supine to sitting edge of bed and CGA to min assist x2 to stand from bed and walk a few feet bed to recliner with RW.  Pt requesting 2nd assist for safety d/t concerns regarding overall weakness and h/o knees buckling.  Difficulty and increased effort noted with pt advancing B LE's to take steps (with walker) to chair and pt declined further ambulation d/t fatigue.  Pt would benefit from skilled PT to address noted impairments and functional limitations (see below for any additional details).  Upon hospital discharge, pt would benefit from STR (pt would prefer to go home with increased support services if possible).    Follow Up Recommendations SNF    Equipment Recommendations  Rolling walker with 5" wheels;3in1 (PT);Wheelchair (measurements PT);Wheelchair cushion (measurements PT)    Recommendations for Other Services OT consult     Precautions / Restrictions Precautions Precautions: Fall Restrictions Weight Bearing Restrictions: No      Mobility  Bed Mobility Overal bed mobility: Needs Assistance Bed Mobility: Supine to Sit      Supine to sit: Max assist;HOB elevated     General bed mobility comments: assist for trunk and B LE's semi-supine to sitting edge of bed; vc's for technique  Transfers Overall transfer level: Needs assistance Equipment used: Rolling walker (2 wheeled) Transfers: Sit to/from Stand Sit to Stand: Min guard;Min assist         General transfer comment: assist to initiate stand up to walker from bed and then to controll descent sitting down in recliner; occasional vc's for transfer technique/positioning  Ambulation/Gait Ambulation/Gait assistance: Min guard;Min assist Gait Distance (Feet): 3 Feet(bed to recliner) Assistive device: Rolling walker (2 wheeled)   Gait velocity: decreased   General Gait Details: short shuffling steps; increased effort to advance B LE's; mildly unsteady  Stairs            Wheelchair Mobility    Modified Rankin (Stroke Patients Only)       Balance Overall balance assessment: Needs assistance Sitting-balance support: No upper extremity supported;Feet supported Sitting balance-Leahy Scale: Good Sitting balance - Comments: steady sitting reaching within BOS   Standing balance support: Bilateral upper extremity supported Standing balance-Leahy Scale: Poor Standing balance comment: pt requiring B UE support on RW for static standing balance                             Pertinent Vitals/Pain Pain Assessment: Faces Faces Pain Scale: Hurts a little bit Pain Location: L knee (d/t "arthritis") Pain Descriptors / Indicators: Sore Pain Intervention(s): Limited activity within patient's tolerance;Monitored during session;Repositioned  Vitals (HR and O2 on  room air) stable and WFL throughout treatment session.    Home Living Family/patient expects to be discharged to:: Private residence Living Arrangements: Alone Available Help at Discharge: Family Type of Home: House Home Access: Stairs to enter Entrance Stairs-Rails: Left(also  holds onto car for support) Technical brewer of Steps: 1 step to enter home Home Layout: One level [place]: Environmental consultant - 4 wheels      Prior Function Level of Independence: Needs assistance   Gait / Transfers Assistance Needed: Ambulatory with rollator within home; borrows manual w/c when in community  ADL's / [place] Needed: Daughters come to assist pt with food, cleaning, and company.        Hand Dominance        Extremity/Trunk Assessment   Upper Extremity Assessment Upper Extremity Assessment: Generalized weakness    Lower Extremity Assessment Lower Extremity Assessment: Generalized weakness    Cervical / Trunk Assessment Cervical / Trunk Assessment: (forward head/shoulders)  Communication   Communication: No difficulties  Cognition Arousal/Alertness: Awake/alert Behavior During Therapy: WFL for tasks assessed/performed Overall Cognitive Status: Within Functional Limits for tasks assessed                                        General Comments   Nursing cleared pt for participation in physical therapy.  Pt agreeable to PT session.    Exercises     Assessment/Plan    PT Assessment Patient needs continued PT services  PT Problem List Decreased strength;Decreased activity tolerance;Decreased balance;Decreased mobility       PT Treatment Interventions DME instruction;Gait training;Stair training;Functional mobility training;Therapeutic activities;Therapeutic exercise;Balance training;Patient/family education    PT Goals (Current goals can be found in the Care Plan section)  Acute Rehab PT Goals Patient Stated Goal: to improve strength and mobility PT Goal Formulation: With patient Time For Goal Achievement: 03/01/20 Potential to Achieve Goals: Good    Frequency Min 2X/week   Barriers to discharge Decreased caregiver support      Co-evaluation               AM-PAC PT "6 Clicks" Mobility  Outcome Measure  Help needed turning from your back to your side while in a flat bed without using bedrails?: A Little Help needed moving from lying on your back to sitting on the side of a flat bed without using bedrails?: A Lot Help needed moving to and from a bed to a chair (including a wheelchair)?: A Little Help needed standing up from a chair using your arms (e.g., wheelchair or bedside chair)?: A Little Help needed to walk in hospital room?: A Lot Help needed climbing 3-5 steps with a railing? : Total 6 Click Score: 14    End of Session Equipment Utilized During Treatment: Gait belt Activity Tolerance: Patient limited by fatigue Patient left: in chair;with call bell/phone within reach;with chair alarm set Nurse Communication: Mobility status;Precautions PT Visit Diagnosis: Other abnormalities of gait and mobility (R26.89);Muscle weakness (generalized) (M62.81);Difficulty in walking, not elsewhere classified (R26.2)    Time: [id number] PT Time Calculation (min) (ACUTE ONLY): 41 min   Charges:   PT Evaluation $PT Eval Low Complexity: 1 Low PT Treatments $Therapeutic Activity: 23-37 mins       Leitha Bleak, PT 02/16/20, 12:46 PM

## 2020-02-17 LAB — CBC
HCT: 39.3 % (ref 39.0–52.0)
Hemoglobin: 13 g/dL (ref 13.0–17.0)
MCH: 30.7 pg (ref 26.0–34.0)
MCHC: 33.1 g/dL (ref 30.0–36.0)
MCV: 92.7 fL (ref 80.0–100.0)
Platelets: 191 10*3/uL (ref 150–400)
RBC: 4.24 MIL/uL (ref 4.22–5.81)
RDW: 13 % (ref 11.5–15.5)
WBC: 8.6 10*3/uL (ref 4.0–10.5)
nRBC: 0 % (ref 0.0–0.2)

## 2020-02-17 LAB — MAGNESIUM: Magnesium: 1.9 mg/dL (ref 1.7–2.4)

## 2020-02-17 LAB — BASIC METABOLIC PANEL
Anion gap: 6 (ref 5–15)
BUN: 27 mg/dL — ABNORMAL HIGH (ref 8–23)
CO2: 23 mmol/L (ref 22–32)
Calcium: 11.8 mg/dL — ABNORMAL HIGH (ref 8.9–10.3)
Chloride: 105 mmol/L (ref 98–111)
Creatinine, Ser: 1.45 mg/dL — ABNORMAL HIGH (ref 0.61–1.24)
GFR calc Af Amer: 50 mL/min — ABNORMAL LOW (ref >60)
GFR calc non Af Amer: 43 mL/min — ABNORMAL LOW (ref >60)
Glucose, Bld: 96 mg/dL (ref 70–99)
Potassium: 4 mmol/L (ref 3.5–5.1)
Sodium: 134 mmol/L — ABNORMAL LOW (ref 135–145)

## 2020-02-17 MED ORDER — MELOXICAM 7.5 MG PO TABS: 7.5000 mg | ORAL_TABLET | Freq: Every day | ORAL | 0 refills | Status: AC | PRN

## 2020-02-17 MED ORDER — DOXYLAMINE SUCCINATE (SLEEP) 25 MG PO TABS
25.0000 mg | ORAL_TABLET | Freq: Every evening | ORAL | Status: DC | PRN
  Administered 2020-02-17: 25 mg via ORAL
  Filled 2020-02-17 (×3): qty 1

## 2020-02-17 NOTE — Discharge Summary (Signed)
Physician Discharge Summary   Jeffrey Dennis  male DOB: 1929/12/02  [id number]  PCP: [name], MD  Admit date: 02/15/2020 Discharge date: 02/17/2020  Admitted From: home Disposition:  Home PT/OT rec SNF rehab, however, pt refused to go despite multiple conversations with physician, nurses, TOC, and his own daughters.  Pt was previously living independently by himself and is his own decision maker, so pt was discharge home, per his wish, with HHPT.  Home Health: Yes CODE STATUS: Full code  Discharge Instructions    Ambulatory referral to Urology   Complete by: As directed    BPH, PSA 15, urinary retention with recent Foley, now removed.   Diet - low sodium heart healthy   Complete by: As directed    Discharge instructions   Complete by: As directed    You do not have symptoms of urinary track infection, however, your prostate has grown bigger again, with your PSA now elevated to 15.  You can urinate on your own, but not completely.  Please be sure to try to urinate about every 4-6 hours before your bladder gets too full.  You will need to follow up with an urologist as outpatient.  We recommend rehab facility for you, but since you refused to go, we have ordered home health physical therapy for you.   Dr. Enzo Bi - -   Increase activity slowly   Complete by: As directed        Hospital Course:  For full details, please see H&P, progress notes, consult notes and ancillary notes.  Briefly,  Jeffrey D Swinneyis a 86 y.o.Caucasianmalewith medical history significant ofBPH s/p TURP, HTNwho presented wanting his Foley removed.   # Urinary retention due to BPH s/p Foley insertion # Hx of BPH s/p TURP Pt was discharged from ED on 4/3 with the Foley due to urinary retention. Pt presented for the purpose of Foley removal.  Foley was removed, and pt underwent voiding trial with bladder scan for 24 hours.  Pt was able to urinate, though needed prompting and did have some  post-void residual, though not enough to warrant I/O cath.  Pt was continued on home Flomax.  Pt was advised to do scheduled voiding to avoid over-distending his bladder.  Pt currently does not have a outpatient urologist, so referral order placed and contact info provided for outpatient urology.    # Elevated PSA CT renal stone on 02/13/20 showed "Marked asymmetric thickening of the right-sided bladder which is contiguous with an enlarged and lobulated prostate."  PSA 15.74.  Will need outpatient urology referral and followup.  # Asymptomatic pyuria Pt was discharged from ED on 4/3 on a course of Bactrim based on UA finding. Urine cxsubsequently grew E coli resistantto Bactrim.  However, pt had no fever, abdominal pain, dysuria, and his mildly elevated WBC count seemed to be baseline. Pt received ceftriaxone in the ED on current presentation, which was not continued.  # Weakness and loss of appetite Pt's main complaints, which have been progressive for months. Limited mobility at baseline, and has been sitting in recliner most days. Pt is of advanced age of 86, could be natural decline, or cancer, or inactivity.  PT/OT rec SNF rehab, however, pt refused to go despite multiple conversations with physician, nurses, TOC, and his own daughters.  Pt was previously living independently by himself and his own decision maker, so pt was discharge home, per his wish, with HHPT.  # HTN continued home labetalol  Discharge Diagnoses:  Principal Problem:   Urinary retention due to benign prostatic hyperplasia    Discharge Instructions:  Allergies as of 02/17/2020   No Known Allergies     Medication List    STOP taking these medications   cephALEXin 250 MG capsule Commonly known as: Keflex   sulfamethoxazole-trimethoprim 800-160 MG tablet Commonly known as: BACTRIM DS     TAKE these medications   labetalol 100 MG tablet Commonly known as: NORMODYNE Take 100 mg by mouth daily in the  afternoon.   meloxicam 7.5 MG tablet Commonly known as: Mobic Take 1 tablet (7.5 mg total) by mouth daily as needed for pain. What changed:   when to take this  reasons to take this   tamsulosin 0.4 MG Caps capsule Commonly known as: FLOMAX Take 0.4 mg by mouth 2 (two) times daily.   traMADol 50 MG tablet Commonly known as: ULTRAM Take 1 tablet (50 mg total) by mouth every 6 (six) hours as needed. What changed: when to take this   traZODone 50 MG tablet Commonly known as: DESYREL Take 50 mg by mouth at bedtime.       Follow-up Information    [name], MD. Schedule an appointment as soon as possible for a visit in 1 week(s).   Specialty: Internal Medicine Contact information: [facility] [place] [address] [phone]        Abbie Sons, MD. Schedule an appointment as soon as possible for a visit in 1 week(s).   Specialty: Urology Contact information: [address] [place] [address] [phone]           No Known Allergies   The results of significant diagnostics from this hospitalization (including imaging, microbiology, ancillary and laboratory) are listed below for reference.   Consultations:   Procedures/Studies: DG Chest 2 View  Result Date: 02/15/2020 CLINICAL DATA:  Weakness. EXAM: CHEST - 2 VIEW COMPARISON:  October 23, 2012. FINDINGS: The heart size and mediastinal contours are within normal limits. Both lungs are clear. The visualized skeletal structures are unremarkable. IMPRESSION: No active cardiopulmonary disease. Electronically Signed   By: Marijo Conception M.D.   On: 02/15/2020 14:01   CT Head Wo Contrast  Result Date: 02/15/2020 CLINICAL DATA:  Altered mental status and dizziness EXAM: CT HEAD WITHOUT CONTRAST TECHNIQUE: Contiguous axial images were obtained from the base of the skull through the vertex without intravenous contrast. COMPARISON:  None. FINDINGS: Brain: There is  moderate diffuse atrophy. There is no intracranial mass, hemorrhage, extra-axial fluid collection, or midline shift. There is mild small vessel disease in the centra semiovale bilaterally. Elsewhere brain parenchyma appears unremarkable. No acute infarct evident. Vascular: There is no appreciable hyperdense vessel. There is calcification in each carotid siphon region. Skull: Bony calvarium appears intact. Sinuses/Orbits: There is mucosal thickening in multiple ethmoid air cells. There is a small air-fluid level in the posterior sphenoid sinus region. Other visualized paranasal sinuses are clear. Orbits appear symmetric bilaterally. Other: Mastoid air cells are clear. IMPRESSION: Atrophy with periventricular small vessel disease. No evident acute infarct. No mass or hemorrhage. There are foci of arterial vascular calcification. There are foci of paranasal sinus disease. Electronically Signed   By: Lowella Grip III M.D.   On: 02/15/2020 14:07   CT Renal Stone Study  Result Date: 02/13/2020 CLINICAL DATA:  Hematuria with unknown cause EXAM: CT ABDOMEN AND PELVIS WITHOUT CONTRAST TECHNIQUE: Multidetector CT imaging of the abdomen and pelvis  was performed following the standard protocol without IV contrast. COMPARISON:  None. FINDINGS: Lower chest:  Coronary atherosclerosis. Hepatobiliary: No focal liver abnormality.Gallstones within the contracted gallbladder. No associated inflammation. Pancreas: Unremarkable. Spleen: Unremarkable. Adrenals/Urinary Tract: Low-density nodule in the right adrenal gland measuring 19 mm, consistent with adenoma. No hydronephrosis or urolithiasis. Asymmetric right renal atrophy with smooth cortex, possibly chronic arterial compromise. A Foley catheter has been placed with decompression of the bladder. The balloon is eccentric within the bladder due to marked right-sided thickening measuring 4 cm thickness, contiguous with a bulky and lobulated prostate. Stomach/Bowel: No evidence of  obstruction or inflammation. There are left-sided diverticula. Vascular/Lymphatic: Extensive atherosclerotic calcification. No mass or adenopathy. Reproductive:Enlarged prostate, as above. Other: No ascites or pneumoperitoneum. Musculoskeletal: No acute abnormalities. Severe lumbar spine degeneration with L4-5 anterolisthesis and advanced canal/foraminal impingement. Extensive bridging osteophytes in the thoracic and mid lumbar spine. IMPRESSION: 1. Marked asymmetric thickening of the right-sided bladder which is contiguous with an enlarged and lobulated prostate. Prostate or urothelial carcinoma is the primary concern. 2. The bladder is decompressed and there is no hydronephrosis. 3. Cholelithiasis. 4. Advanced spinal degeneration especially at L4-5 where there is high-grade canal and foraminal impingement. Electronically Signed   By: Monte Fantasia M.D.   On: 02/13/2020 09:09      Labs: BNP (last 3 results) No results for input(s): BNP in the last 8760 hours. Basic Metabolic Panel: Recent Labs  Lab 02/13/20 0754 02/15/20 1148 02/16/20 0614 02/17/20 0527  NA 137 132* 135 134*  K 4.1 3.8 3.9 4.0  CL 102 103 104 105  CO2 25 24 24 23   GLUCOSE 115* 139* 100* 96  BUN 22 25* 25* 27*  CREATININE 1.28* 1.61* 1.48* 1.45*  CALCIUM 11.6* 11.3* 11.3* 11.8*  MG  --   --  2.0 1.9   Liver Function Tests: Recent Labs  Lab 02/15/20 1148  AST 22  ALT 25  ALKPHOS 74  BILITOT 0.9  PROT 6.7  ALBUMIN 3.7   No results for input(s): LIPASE, AMYLASE in the last 168 hours. No results for input(s): AMMONIA in the last 168 hours. CBC: Recent Labs  Lab 02/13/20 0754 02/15/20 1148 02/16/20 0614 02/17/20 0527  WBC 13.1* 10.8* 11.3* 8.6  NEUTROABS 9.3*  --   --   --   HGB 13.9 13.5 13.2 13.0  HCT 42.6 41.0 40.5 39.3  MCV 93.4 93.6 94.2 92.7  PLT 198 212 195 191   Cardiac Enzymes: Recent Labs  Lab 02/15/20 1148  CKTOTAL 31*   BNP: Invalid input(s): POCBNP CBG: No results for input(s):  GLUCAP in the last 168 hours. D-Dimer No results for input(s): DDIMER in the last 72 hours. Hgb A1c No results for input(s): HGBA1C in the last 72 hours. Lipid Profile No results for input(s): CHOL, HDL, LDLCALC, TRIG, CHOLHDL, LDLDIRECT in the last 72 hours. Thyroid function studies No results for input(s): TSH, T4TOTAL, T3FREE, THYROIDAB in the last 72 hours.  Invalid input(s): FREET3 Anemia work up No results for input(s): VITAMINB12, FOLATE, FERRITIN, TIBC, IRON, RETICCTPCT in the last 72 hours. Urinalysis    Component Value Date/Time   COLORURINE YELLOW (A) 02/15/2020 1304   APPEARANCEUR CLOUDY (A) 02/15/2020 1304   LABSPEC 1.013 02/15/2020 1304   PHURINE 6.0 02/15/2020 1304   GLUCOSEU NEGATIVE 02/15/2020 1304   HGBUR SMALL (A) 02/15/2020 1304   BILIRUBINUR NEGATIVE 02/15/2020 1304   KETONESUR NEGATIVE 02/15/2020 1304   PROTEINUR 100 (A) 02/15/2020 1304   UROBILINOGEN 0.2 10/23/2012 1620  NITRITE NEGATIVE 02/15/2020 1304   LEUKOCYTESUR LARGE (A) 02/15/2020 1304   Sepsis Labs Invalid input(s): PROCALCITONIN,  WBC,  LACTICIDVEN Microbiology Recent Results (from the past 240 hour(s))  Urine culture     Status: Abnormal   Collection Time: 02/13/20  4:32 AM   Specimen: Urine, Random  Result Value Ref Range Status   Specimen Description   Final    URINE, RANDOM Performed at [facility], [address], [place], [place] [address]    Special Requests   Final    NONE Performed at [facility], [place], [place] [address]    Culture >=100,000 COLONIES/mL ESCHERICHIA COLI (A)  Final   Report Status 02/15/2020 FINAL  Final   Organism ID, Bacteria ESCHERICHIA COLI (A)  Final      Susceptibility   Escherichia coli - MIC*    AMPICILLIN >=32 RESISTANT Resistant     CEFAZOLIN 8 SENSITIVE Sensitive     CEFTRIAXONE <=0.25 SENSITIVE Sensitive     CIPROFLOXACIN >=4 RESISTANT Resistant     GENTAMICIN >=16 RESISTANT Resistant     IMIPENEM  <=0.25 SENSITIVE Sensitive     NITROFURANTOIN <=16 SENSITIVE Sensitive     TRIMETH/SULFA >=320 RESISTANT Resistant     AMPICILLIN/SULBACTAM >=32 RESISTANT Resistant     PIP/TAZO 64 INTERMEDIATE Intermediate     * >=100,000 COLONIES/mL ESCHERICHIA COLI  SARS CORONAVIRUS 2 (TAT 6-24 HRS) Nasopharyngeal Nasopharyngeal Swab     Status: None   Collection Time: 02/15/20  2:39 PM   Specimen: Nasopharyngeal Swab  Result Value Ref Range Status   SARS Coronavirus 2 NEGATIVE NEGATIVE Final    Comment: (NOTE) SARS-CoV-2 target nucleic acids are NOT DETECTED. The SARS-CoV-2 RNA is generally detectable in upper and lower respiratory specimens during the acute phase of infection. Negative results do not preclude SARS-CoV-2 infection, do not rule out co-infections with other pathogens, and should not be used as the sole basis for treatment or other patient management decisions. Negative results must be combined with clinical observations, patient history, and epidemiological information. The expected result is Negative. Fact Sheet for Patients: SugarRoll.be Fact Sheet for Healthcare Providers: https://www.woods-mathews.com/ This test is not yet approved or cleared by the [place] FDA and  has been authorized for detection and/or diagnosis of SARS-CoV-2 by FDA under an Emergency Use Authorization (EUA). This EUA will remain  in effect (meaning this test can be used) for the duration of the COVID-19 declaration under Section 56 4(b)(1) of the Act, 21 U.S.C. section 360bbb-3(b)(1), unless the authorization is terminated or revoked sooner. Performed at [facility], [place] [address], [place], [place] [address]      Total time spend on discharging this patient, including the last patient exam, discussing the hospital stay, instructions for ongoing care as it relates to all pertinent caregivers, as well as preparing the medical discharge records,  prescriptions, and/or referrals as applicable, is 50 minutes.    Enzo Bi, MD  [facility] 02/17/2020, 11:23 AM  If 7PM-7AM, please contact night-coverage

## 2020-02-17 NOTE — Progress Notes (Signed)
Pt bladder scanned 572 pt voided with encouragement 350cc then bladder scan 400 pt needs to be encouraged to void. Pt is sleepy no s/s of distress

## 2020-02-17 NOTE — TOC Initial Note (Signed)
Transition of Care [facility]) - Initial/Assessment Note    Patient Details  Name: Jeffrey Dennis MRN: [id number] Date of Birth: 1930/06/18  Transition [facility] CM/SW Contact:    Shelbie Ammons, RN Phone Number: 02/17/2020, 3:10 PM  Clinical Narrative:    0930: After consultation with MD met with patient at bedside regarding need for skilled nursing at discharge. Patient noted to be dosing lying in bed but did awaken easily and was agreeable to speak with this CM. Attempted to discuss recommendations that he he be discharged to rehab but patient was saying no before CM could even get out recommendations. Patient reports that he has family at home to help him and does not think he needs to go anywhere and that if he needs to get more help in home he will do it. Notified Dr. Billie Ruddy of patient's decision and that this CM would contact daughters. Placed call to daughter Ivin Booty to discuss, she verbalizes that she and her sister believe patient should go to rehab but that ultimately they understand the decision is his.   1pm- Received message from Dr. Billie Ruddy that she had met with patient and daughters at bedside, she was still unable to convince patient that his best interest would be to go to rehab for short term and that she explained to daughters that we are only able to make suggestions but ultimately the decision is his.   2:45pm- Received message from daughter Ivin Booty requesting that Dr. Billie Ruddy again go and talk to patient as she and her sister were unable to convince him that returning home at this time is not a good idea. Returned call and discussed that at this time the doctor would be unlikely to be able to return to see patient again for day and that with her last trip she reported she didn't think he would change his mind. Ivin Booty is agreeable to [facility] being arranged however she reports that patient will likely refuse. Corene Cornea with [facility] contacted for PT, SW.       Expected Discharge Plan: Home/Self  Care Barriers to Discharge: Barriers Resolved   Patient Goals and CMS Choice        Expected Discharge Plan and Services Expected Discharge Plan: Home/Self Care   Discharge Planning Services: CM Consult   Living arrangements for the past 2 months: Single Family Home Expected Discharge Date: 02/17/20                         HH Arranged: PT, Social Work, Refused SNF [place] Agency: Microbiologist ([place]) Date [place]: 02/17/20 Time [place]: 1430 Representative spoke with at [place]: Corene Cornea, [facility]  Prior Living Arrangements/Services Living arrangements for the past 2 months: [place] with:: Self Patient language and need for interpreter reviewed:: Yes Do you feel safe going back to the place where you live?: Yes      Need for Family Participation in Patient Care: Yes (Comment) Care giver support system in place?: Yes (comment)   Criminal Activity/Legal Involvement Pertinent to Current Situation/Hospitalization: No - Comment as needed  Activities of Daily Living Home Assistive Devices/Equipment: Gilford Rile (specify type) ADL Screening (condition at time of admission) Patient's cognitive ability adequate to safely complete daily activities?: Yes Is the patient deaf or have difficulty hearing?: Yes Does the patient have difficulty seeing, even when wearing glasses/contacts?: No Does the patient have difficulty concentrating, remembering, or making decisions?: No Patient able to express  need for assistance with ADLs?: Yes Does the patient have difficulty dressing or bathing?: Yes Independently performs ADLs?: Yes (appropriate for developmental age) Does the patient have difficulty walking or climbing stairs?: Yes Weakness of Legs: Both Weakness of Arms/Hands: None  Permission Sought/Granted                  Emotional Assessment Appearance:: Appears stated age Attitude/Demeanor/Rapport: Lethargic   Orientation: : Oriented  to Self, Oriented to Place, Oriented to  Time, Oriented to Situation      Admission diagnosis:  Urinary retention [R33.9] UTI (urinary tract infection) [N39.0] Acute cystitis without hematuria [N30.00] Generalized weakness [R53.1] Patient Active Problem List   Diagnosis Date Noted  . UTI (urinary tract infection) 02/15/2020  . Urinary retention due to benign prostatic hyperplasia 02/15/2020  . Gastritis 10/24/2012  . Acute on chronic renal failure ([place]) 10/23/2012  . Encephalopathy acute 10/23/2012  . Leukocytosis 10/23/2012   PCP:  [name], MD Pharmacy:   [facility] [phone], [place] [address] [place] [address] Phone: [phone] Fax: [phone]     Social Determinants of Health (SDOH) Interventions    Readmission Risk Interventions No flowsheet data found.

## 2020-02-17 NOTE — Progress Notes (Signed)
IV and tele removed from patient. Discharge instructions given to patient and daughter Ivin Booty at bedside. Daughter to transport patient home.

## 2020-02-17 NOTE — Progress Notes (Signed)
Patient refused to ambulate in room from chair to door. Advised patient that it would be beneficial to see how his ambulation is before going home. Patient states he will walk when he gets home. Family is at bedside and is visibly upset that patient is refusing to walk or go to rehab. MD came to bedside earlier to speak with patient and family. Patient is still adamant to go home.

## 2020-02-22 ENCOUNTER — Ambulatory Visit: Payer: Self-pay | Admitting: Urology

## 2020-02-22 ENCOUNTER — Encounter: Payer: Self-pay | Admitting: Urology

## 2020-02-26 ENCOUNTER — Ambulatory Visit: Payer: Medicare Other | Admitting: Urology

## 2022-03-14 ENCOUNTER — Emergency Department: Payer: Medicare Other

## 2022-03-14 ENCOUNTER — Encounter: Payer: Self-pay | Admitting: Emergency Medicine

## 2022-03-14 ENCOUNTER — Emergency Department
Admission: EM | Admit: 2022-03-14 | Discharge: 2022-03-14 | Disposition: A | Discharge status: Home / Self Care | Payer: Medicare Other | Attending: Emergency Medicine | Admitting: Emergency Medicine

## 2022-03-14 ENCOUNTER — Other Ambulatory Visit: Payer: Self-pay

## 2022-03-14 DIAGNOSIS — G8911 Acute pain due to trauma: Secondary | ICD-10-CM | POA: Insufficient documentation

## 2022-03-14 DIAGNOSIS — M25562 Pain in left knee: Secondary | ICD-10-CM | POA: Diagnosis not present

## 2022-03-14 DIAGNOSIS — W1839XA Other fall on same level, initial encounter: Secondary | ICD-10-CM | POA: Insufficient documentation

## 2022-03-14 DIAGNOSIS — Y92002 Bathroom of unspecified non-institutional (private) residence single-family (private) house as the place of occurrence of the external cause: Secondary | ICD-10-CM | POA: Diagnosis not present

## 2022-03-14 DIAGNOSIS — W19XXXA Unspecified fall, initial encounter: Secondary | ICD-10-CM

## 2022-03-14 MED ORDER — LIDOCAINE 5 % EX PTCH: 1.0000 | MEDICATED_PATCH | Freq: Two times a day (BID) | CUTANEOUS | 0 refills | Status: AC

## 2022-03-14 MED ORDER — ACETAMINOPHEN 325 MG PO TABS
650.0000 mg | ORAL_TABLET | Freq: Once | ORAL | Status: AC
Start: 2022-03-14 — End: 2022-03-14
  Administered 2022-03-14: 650 mg via ORAL
  Filled 2022-03-14: qty 2

## 2022-03-14 MED ORDER — LIDOCAINE 5 % EX PTCH
1.0000 | MEDICATED_PATCH | CUTANEOUS | Status: DC
  Administered 2022-03-14: 1 via TRANSDERMAL
  Filled 2022-03-14: qty 1

## 2022-03-14 NOTE — ED Triage Notes (Signed)
Ems fromhome fell yeseterday and has knee pain.  Vss.   ?

## 2022-03-14 NOTE — ED Notes (Signed)
Called [facility] for transport to home [address] [place]  [id number]  1530 ?

## 2022-03-14 NOTE — Discharge Instructions (Addendum)
You may use the patches as needed for pain.  Please return for any new, worsening, or change in symptoms or other concerns.  Please follow-up with the orthopedist.  It was a pleasure caring for you today. ?

## 2022-03-14 NOTE — ED Notes (Signed)
[facility] has paperwork. Taking pt home via EMS.  ?

## 2022-03-14 NOTE — ED Triage Notes (Signed)
Pt via EMS from home. Pt states that he loss his balance yesterday. Denies any LOC. Denies head injury. Denies blood thinners. Pt is c/o L knee pain, states he could not bear weight on it. Pt is A&OX4 and NAD ?

## 2022-03-14 NOTE — ED Provider Notes (Signed)
? ?[facility] ?Provider Note ? ? ? Event Date/Time  ? First MD Initiated Contact with Patient 03/14/22 1340   ?  (approximate) ? ? ?History  ? ?Fall ? ? ?HPI ? ?Jeffrey Dennis is a 88 y.o. male with a past medical history of gastritis who presents today for evaluation of left knee pain.  Patient reports that he woke up from a nap at around 1 PM yesterday and went to use the bathroom, however his walker does not fit through the door and so he did not use his walker to get into the bathroom as he normally does.  He reports that the sink is on wheels and when he leaned on the sink it rolled and he fell.  He does not remember exactly how he fell, however he hit his left knee pain and believes that he fell on his knee.  He does not think that he struck his head.  His daughter was home with him, and he did not lose consciousness.  His daughter called EMS who helped the patient back into his bed.  Patient later got up to go to his recliner.  He slept in his recliner because he had too much pain to get out of the recliner.  He left his Advil on the counter, so did not take any.  His daughter called his doctor, Dr. Sabra Heck, who prescribed prednisone for pain.  Patient has not picked this up yet. Describes the pain as deep and aching.  Denies headache, neck pain, chest pain, back pain, abdominal pain, vomiting.  ? ?Patient Active Problem List  ? Diagnosis Date Noted  ? UTI (urinary tract infection) 02/15/2020  ? Urinary retention due to benign prostatic hyperplasia 02/15/2020  ? Gastritis 10/24/2012  ? Acute on chronic renal failure ([place]) 10/23/2012  ? Encephalopathy acute 10/23/2012  ? Leukocytosis 10/23/2012  ? ? ? ?  ? ? ?Physical Exam  ? ?Triage Vital Signs: ?ED Triage Vitals  ?Enc Vitals Group  ?   BP 03/14/22 1235 123/65  ?   Pulse Rate 03/14/22 1235 77  ?   Resp 03/14/22 1235 18  ?   Temp 03/14/22 1235 98.7 ?F (37.1 ?C)  ?   Temp Source 03/14/22 1235 Oral  ?   SpO2 03/14/22 1235 98 %  ?   Weight  03/14/22 1233 220 lb (99.8 kg)  ?   Height 03/14/22 1233 '5\' 11"'$  (1.803 m)  ?   Head Circumference --   ?   Peak Flow --   ?   Pain Score 03/14/22 1233 10  ?   Pain Loc --   ?   Pain Edu? --   ?   Excl. in [place]? --   ? ? ?Most recent vital signs: ?Vitals:  ? 03/14/22 1235  ?BP: 123/65  ?Pulse: 77  ?Resp: 18  ?Temp: 98.7 ?F (37.1 ?C)  ?SpO2: 98%  ? ? ?General: Awake, no distress.  ?CV:  Good peripheral perfusion.  Normal and equal pulses in all 4 extremities ?Resp:  Normal effort.  No increased work of breathing ?Abd:  No distention.  No abdominal tenderness or ecchymosis ?Other:  Left knee: No deformity, erythema, or ecchymosis. Medial joint line tenderness. No patellar tenderness, no ballotment ?Warm and well perfused extremity with 2+ pedal pulses ?5/5 strength to dorsiflexion and plantarflexion at the ankle with intact sensation throughout extremity ?Normal range of motion of the knee, with intact flexion and extension to active and passive range of  motion. Extensor mechanism intact. ?No ligamentous laxity. Negative anterior/posterior drawer/negative lachman, negative mcmurrays ?No effusion or warmth ?Intact quadriceps, hamstring function, patellar tendon function ?Pelvis stable ?Full ROM of ankle without pain or swelling ?Foot warm and well perfused ? ? ? ?ED Results / Procedures / Treatments  ? ?Labs ?(all labs ordered are listed, but only abnormal results are displayed) ?Labs Reviewed - No data to display ? ? ?EKG ? ? ? ? ?RADIOLOGY ?I independently reviewed x-rays and in agreement with the radiologist read ? ? ? ?PROCEDURES: ? ?Critical Care performed:  ? ?Procedures ? ? ?MEDICATIONS ORDERED IN ED: ?Medications  ?lidocaine (LIDODERM) 5 % 1 patch (1 patch Transdermal Patch Applied 03/14/22 1408)  ?acetaminophen (TYLENOL) tablet 650 mg (650 mg Oral Given 03/14/22 1406)  ? ? ? ?IMPRESSION / MDM / ASSESSMENT AND PLAN / ED COURSE  ?I reviewed the triage vital signs and the nursing notes. ? ? ?Differential diagnosis  includes, but is not limited to, effusion, sprain, contusion, dislocation, fracture, joint infection, tendon rupture. No evidence of neurological deficit or vascular compromise on exam, foot is warm well perfused, normal capillary refill, normal pedal pulses, sensation intact to light touch throughout, no history of noticing a deformity to his knee. No acute fracture/dislocation on X-Ray. No deformity or obvious ligamentous laxity on exam.extensor mechanism is intact.  No constitutional symptoms or effusion/warmth/erythema to suggest septic joint. No history of immunosuppression. Overall well appearing, vital signs stable.  Patient was treated symptomatically with improvement of his symptoms.  He is able to bear weight with his device.  He has a walker at home.  No leg swelling or calf tenderness to suggest DVT.  No head strike or LOC, no headache or anticoagulation, no focal neurological deficits. Return precautions and care instructions discussed. Outpatient follow-up advised. Patient and daughter agree with plan of care. ? ? ? ?FINAL CLINICAL IMPRESSION(S) / ED DIAGNOSES  ? ?Final diagnoses:  ?Fall, initial encounter  ?Acute pain of left knee  ? ? ? ?Rx / DC Orders  ? ?ED Discharge Orders   ? ?      Ordered  ?  lidocaine (LIDODERM) 5 %  Every 12 hours       ? 03/14/22 1451  ? ?  ?  ? ?  ? ? ? ?Note:  This document was prepared using Dragon voice recognition software and may include unintentional dictation errors. ?  ?Marquette Old, PA-C ?03/14/22 1953 ? ?  ?Lavonia Drafts, MD ?03/17/22 0725 ? ?

## 2022-05-10 ENCOUNTER — Emergency Department: Payer: Medicare Other

## 2022-05-10 ENCOUNTER — Other Ambulatory Visit: Payer: Self-pay

## 2022-05-10 ENCOUNTER — Encounter: Payer: Self-pay | Admitting: *Deleted

## 2022-05-10 DIAGNOSIS — Z1629 Resistance to other single specified antibiotic: Secondary | ICD-10-CM | POA: Diagnosis present

## 2022-05-10 DIAGNOSIS — Z66 Do not resuscitate: Secondary | ICD-10-CM | POA: Diagnosis present

## 2022-05-10 DIAGNOSIS — Z9079 Acquired absence of other genital organ(s): Secondary | ICD-10-CM

## 2022-05-10 DIAGNOSIS — U071 COVID-19: Secondary | ICD-10-CM | POA: Diagnosis not present

## 2022-05-10 DIAGNOSIS — Z8546 Personal history of malignant neoplasm of prostate: Secondary | ICD-10-CM

## 2022-05-10 DIAGNOSIS — Z8249 Family history of ischemic heart disease and other diseases of the circulatory system: Secondary | ICD-10-CM

## 2022-05-10 DIAGNOSIS — N400 Enlarged prostate without lower urinary tract symptoms: Secondary | ICD-10-CM | POA: Diagnosis present

## 2022-05-10 DIAGNOSIS — J1282 Pneumonia due to coronavirus disease 2019: Secondary | ICD-10-CM | POA: Diagnosis present

## 2022-05-10 DIAGNOSIS — M1712 Unilateral primary osteoarthritis, left knee: Secondary | ICD-10-CM | POA: Diagnosis present

## 2022-05-10 DIAGNOSIS — H9190 Unspecified hearing loss, unspecified ear: Secondary | ICD-10-CM | POA: Diagnosis present

## 2022-05-10 DIAGNOSIS — Z79899 Other long term (current) drug therapy: Secondary | ICD-10-CM

## 2022-05-10 DIAGNOSIS — B9620 Unspecified Escherichia coli [E. coli] as the cause of diseases classified elsewhere: Secondary | ICD-10-CM | POA: Diagnosis present

## 2022-05-10 DIAGNOSIS — E559 Vitamin D deficiency, unspecified: Secondary | ICD-10-CM | POA: Diagnosis present

## 2022-05-10 DIAGNOSIS — E210 Primary hyperparathyroidism: Secondary | ICD-10-CM | POA: Diagnosis present

## 2022-05-10 DIAGNOSIS — Z7952 Long term (current) use of systemic steroids: Secondary | ICD-10-CM

## 2022-05-10 DIAGNOSIS — K219 Gastro-esophageal reflux disease without esophagitis: Secondary | ICD-10-CM | POA: Diagnosis present

## 2022-05-10 DIAGNOSIS — Z1623 Resistance to quinolones and fluoroquinolones: Secondary | ICD-10-CM | POA: Diagnosis present

## 2022-05-10 DIAGNOSIS — N1831 Chronic kidney disease, stage 3a: Secondary | ICD-10-CM | POA: Diagnosis present

## 2022-05-10 DIAGNOSIS — G4700 Insomnia, unspecified: Secondary | ICD-10-CM | POA: Diagnosis not present

## 2022-05-10 DIAGNOSIS — M109 Gout, unspecified: Secondary | ICD-10-CM | POA: Diagnosis present

## 2022-05-10 DIAGNOSIS — D6959 Other secondary thrombocytopenia: Secondary | ICD-10-CM | POA: Diagnosis present

## 2022-05-10 DIAGNOSIS — F419 Anxiety disorder, unspecified: Secondary | ICD-10-CM | POA: Diagnosis not present

## 2022-05-10 DIAGNOSIS — Z888 Allergy status to other drugs, medicaments and biological substances status: Secondary | ICD-10-CM

## 2022-05-10 DIAGNOSIS — N390 Urinary tract infection, site not specified: Secondary | ICD-10-CM | POA: Diagnosis present

## 2022-05-10 DIAGNOSIS — I129 Hypertensive chronic kidney disease with stage 1 through stage 4 chronic kidney disease, or unspecified chronic kidney disease: Secondary | ICD-10-CM | POA: Diagnosis present

## 2022-05-10 DIAGNOSIS — Z823 Family history of stroke: Secondary | ICD-10-CM

## 2022-05-10 DIAGNOSIS — E871 Hypo-osmolality and hyponatremia: Secondary | ICD-10-CM | POA: Diagnosis present

## 2022-05-10 DIAGNOSIS — M1611 Unilateral primary osteoarthritis, right hip: Secondary | ICD-10-CM | POA: Diagnosis present

## 2022-05-10 DIAGNOSIS — Z1611 Resistance to penicillins: Secondary | ICD-10-CM | POA: Diagnosis present

## 2022-05-10 DIAGNOSIS — K5900 Constipation, unspecified: Secondary | ICD-10-CM | POA: Diagnosis not present

## 2022-05-10 LAB — SARS CORONAVIRUS 2 BY RT PCR: SARS Coronavirus 2 by RT PCR: POSITIVE — AB

## 2022-05-10 LAB — CBC
HCT: 41.8 % (ref 39.0–52.0)
Hemoglobin: 13.3 g/dL (ref 13.0–17.0)
MCH: 28.9 pg (ref 26.0–34.0)
MCHC: 31.8 g/dL (ref 30.0–36.0)
MCV: 90.9 fL (ref 80.0–100.0)
Platelets: 119 10*3/uL — ABNORMAL LOW (ref 150–400)
RBC: 4.6 MIL/uL (ref 4.22–5.81)
RDW: 13.4 % (ref 11.5–15.5)
WBC: 7.5 10*3/uL (ref 4.0–10.5)
nRBC: 0 % (ref 0.0–0.2)

## 2022-05-10 LAB — BASIC METABOLIC PANEL
Anion gap: 10 (ref 5–15)
BUN: 21 mg/dL (ref 8–23)
CO2: 23 mmol/L (ref 22–32)
Calcium: 10.1 mg/dL (ref 8.9–10.3)
Chloride: 101 mmol/L (ref 98–111)
Creatinine, Ser: 1.29 mg/dL — ABNORMAL HIGH (ref 0.61–1.24)
GFR, Estimated: 53 mL/min — ABNORMAL LOW (ref >60)
Glucose, Bld: 105 mg/dL — ABNORMAL HIGH (ref 70–99)
Potassium: 3.8 mmol/L (ref 3.5–5.1)
Sodium: 134 mmol/L — ABNORMAL LOW (ref 135–145)

## 2022-05-10 LAB — TROPONIN I (HIGH SENSITIVITY): Troponin I (High Sensitivity): 10 ng/L (ref <18)

## 2022-05-10 NOTE — ED Triage Notes (Signed)
Pt brought in via ems from home with positive home covid test today. Pt reports a cough with yellow phlegm.  Diarrhea x 2 today.  Pt alert.   Pt in a recliner.

## 2022-05-11 ENCOUNTER — Inpatient Hospital Stay
Admission: EM | Admit: 2022-05-11 | Discharge: 2022-05-22 | DRG: 177 | Disposition: A | Discharge status: Skilled Nursing Facility | Payer: Medicare Other | Attending: Internal Medicine | Admitting: Internal Medicine

## 2022-05-11 DIAGNOSIS — E871 Hypo-osmolality and hyponatremia: Secondary | ICD-10-CM | POA: Diagnosis present

## 2022-05-11 DIAGNOSIS — G4700 Insomnia, unspecified: Secondary | ICD-10-CM | POA: Diagnosis not present

## 2022-05-11 DIAGNOSIS — M109 Gout, unspecified: Secondary | ICD-10-CM | POA: Diagnosis present

## 2022-05-11 DIAGNOSIS — I10 Essential (primary) hypertension: Secondary | ICD-10-CM | POA: Diagnosis present

## 2022-05-11 DIAGNOSIS — N390 Urinary tract infection, site not specified: Secondary | ICD-10-CM | POA: Diagnosis present

## 2022-05-11 DIAGNOSIS — N400 Enlarged prostate without lower urinary tract symptoms: Secondary | ICD-10-CM | POA: Diagnosis present

## 2022-05-11 DIAGNOSIS — U071 COVID-19: Principal | ICD-10-CM

## 2022-05-11 DIAGNOSIS — E559 Vitamin D deficiency, unspecified: Secondary | ICD-10-CM

## 2022-05-11 DIAGNOSIS — M1712 Unilateral primary osteoarthritis, left knee: Secondary | ICD-10-CM | POA: Diagnosis present

## 2022-05-11 DIAGNOSIS — J1282 Pneumonia due to coronavirus disease 2019: Secondary | ICD-10-CM

## 2022-05-11 DIAGNOSIS — N3000 Acute cystitis without hematuria: Secondary | ICD-10-CM

## 2022-05-11 DIAGNOSIS — H9190 Unspecified hearing loss, unspecified ear: Secondary | ICD-10-CM | POA: Diagnosis present

## 2022-05-11 DIAGNOSIS — Z823 Family history of stroke: Secondary | ICD-10-CM | POA: Diagnosis not present

## 2022-05-11 DIAGNOSIS — M1611 Unilateral primary osteoarthritis, right hip: Secondary | ICD-10-CM | POA: Diagnosis present

## 2022-05-11 DIAGNOSIS — N1831 Chronic kidney disease, stage 3a: Secondary | ICD-10-CM | POA: Diagnosis present

## 2022-05-11 DIAGNOSIS — R531 Weakness: Secondary | ICD-10-CM | POA: Diagnosis not present

## 2022-05-11 DIAGNOSIS — Z1623 Resistance to quinolones and fluoroquinolones: Secondary | ICD-10-CM | POA: Diagnosis present

## 2022-05-11 DIAGNOSIS — Z1629 Resistance to other single specified antibiotic: Secondary | ICD-10-CM | POA: Diagnosis present

## 2022-05-11 DIAGNOSIS — Z888 Allergy status to other drugs, medicaments and biological substances status: Secondary | ICD-10-CM | POA: Diagnosis not present

## 2022-05-11 DIAGNOSIS — K5900 Constipation, unspecified: Secondary | ICD-10-CM | POA: Diagnosis not present

## 2022-05-11 DIAGNOSIS — Z1611 Resistance to penicillins: Secondary | ICD-10-CM | POA: Diagnosis present

## 2022-05-11 DIAGNOSIS — Z66 Do not resuscitate: Secondary | ICD-10-CM | POA: Diagnosis present

## 2022-05-11 DIAGNOSIS — F419 Anxiety disorder, unspecified: Secondary | ICD-10-CM | POA: Diagnosis not present

## 2022-05-11 DIAGNOSIS — E210 Primary hyperparathyroidism: Secondary | ICD-10-CM

## 2022-05-11 DIAGNOSIS — D6959 Other secondary thrombocytopenia: Secondary | ICD-10-CM | POA: Diagnosis present

## 2022-05-11 DIAGNOSIS — D696 Thrombocytopenia, unspecified: Secondary | ICD-10-CM | POA: Diagnosis not present

## 2022-05-11 DIAGNOSIS — B9620 Unspecified Escherichia coli [E. coli] as the cause of diseases classified elsewhere: Secondary | ICD-10-CM | POA: Diagnosis present

## 2022-05-11 DIAGNOSIS — I129 Hypertensive chronic kidney disease with stage 1 through stage 4 chronic kidney disease, or unspecified chronic kidney disease: Secondary | ICD-10-CM | POA: Diagnosis present

## 2022-05-11 DIAGNOSIS — K219 Gastro-esophageal reflux disease without esophagitis: Secondary | ICD-10-CM | POA: Diagnosis present

## 2022-05-11 LAB — URINALYSIS, ROUTINE W REFLEX MICROSCOPIC
Bilirubin Urine: NEGATIVE
Glucose, UA: NEGATIVE mg/dL
Ketones, ur: NEGATIVE mg/dL
Nitrite: NEGATIVE
Protein, ur: NEGATIVE mg/dL
Specific Gravity, Urine: 1.006 (ref 1.005–1.030)
WBC, UA: >50 WBC/hpf — ABNORMAL HIGH (ref 0–5)
pH: 6 (ref 5.0–8.0)

## 2022-05-11 LAB — TROPONIN I (HIGH SENSITIVITY): Troponin I (High Sensitivity): 13 ng/L (ref <18)

## 2022-05-11 LAB — TECHNOLOGIST SMEAR REVIEW

## 2022-05-11 LAB — LACTATE DEHYDROGENASE: LDH: 107 U/L (ref 98–192)

## 2022-05-11 MED ORDER — LABETALOL HCL 100 MG PO TABS
100.0000 mg | ORAL_TABLET | Freq: Every day | ORAL | Status: DC
  Administered 2022-05-11 – 2022-05-21 (×11): 100 mg via ORAL
  Filled 2022-05-11 (×11): qty 1

## 2022-05-11 MED ORDER — TAMSULOSIN HCL 0.4 MG PO CAPS
0.4000 mg | ORAL_CAPSULE | Freq: Two times a day (BID) | ORAL | Status: DC
  Administered 2022-05-11 – 2022-05-22 (×23): 0.4 mg via ORAL
  Filled 2022-05-11 (×23): qty 1

## 2022-05-11 MED ORDER — IPRATROPIUM BROMIDE HFA 17 MCG/ACT IN AERS
2.0000 | INHALATION_SPRAY | Freq: Four times a day (QID) | RESPIRATORY_TRACT | Status: DC
Start: 2022-05-11 — End: 2022-05-11

## 2022-05-11 MED ORDER — ONDANSETRON HCL 4 MG/2ML IJ SOLN: 4.0000 mg | Freq: Three times a day (TID) | INTRAMUSCULAR | Status: DC | PRN

## 2022-05-11 MED ORDER — LOPERAMIDE HCL 2 MG PO CAPS: 2.0000 mg | ORAL_CAPSULE | Freq: Two times a day (BID) | ORAL | Status: DC | PRN

## 2022-05-11 MED ORDER — AZITHROMYCIN 500 MG PO TABS: 500.0000 mg | ORAL_TABLET | Freq: Every day | ORAL | Status: DC

## 2022-05-11 MED ORDER — HYDRALAZINE HCL 20 MG/ML IJ SOLN
5.0000 mg | INTRAMUSCULAR | Status: DC | PRN
Start: 2022-05-11 — End: 2022-05-22

## 2022-05-11 MED ORDER — NIRMATRELVIR/RITONAVIR (PAXLOVID) TABLET (RENAL DOSING)
2.0000 | ORAL_TABLET | Freq: Two times a day (BID) | ORAL | Status: AC
  Administered 2022-05-11 – 2022-05-15 (×9): 2 via ORAL
  Filled 2022-05-11: qty 20

## 2022-05-11 MED ORDER — ALBUTEROL SULFATE (2.5 MG/3ML) 0.083% IN NEBU: 2.5000 mg | INHALATION_SOLUTION | RESPIRATORY_TRACT | Status: DC | PRN

## 2022-05-11 MED ORDER — DM-GUAIFENESIN ER 30-600 MG PO TB12: 1.0000 | ORAL_TABLET | Freq: Two times a day (BID) | ORAL | Status: DC | PRN

## 2022-05-11 MED ORDER — ACETAMINOPHEN 325 MG PO TABS
650.0000 mg | ORAL_TABLET | Freq: Four times a day (QID) | ORAL | Status: DC | PRN
  Administered 2022-05-12 – 2022-05-21 (×13): 650 mg via ORAL
  Filled 2022-05-11 (×14): qty 2

## 2022-05-11 MED ORDER — AZITHROMYCIN 250 MG PO TABS: 250.0000 mg | ORAL_TABLET | Freq: Every day | ORAL | Status: DC

## 2022-05-11 MED ORDER — MELATONIN 5 MG PO TABS
5.0000 mg | ORAL_TABLET | Freq: Once | ORAL | Status: AC
  Administered 2022-05-11: 5 mg via ORAL
  Filled 2022-05-11: qty 1

## 2022-05-11 MED ORDER — NIRMATRELVIR/RITONAVIR (PAXLOVID)TABLET: 3.0000 | ORAL_TABLET | Freq: Two times a day (BID) | ORAL | Status: DC

## 2022-05-11 MED ORDER — SODIUM CHLORIDE 0.9 % IV SOLN
1.0000 g | INTRAVENOUS | Status: DC
  Administered 2022-05-11 – 2022-05-13 (×3): 1 g via INTRAVENOUS
  Filled 2022-05-11 (×2): qty 10
  Filled 2022-05-11: qty 1

## 2022-05-11 MED ORDER — ONDANSETRON HCL 4 MG/2ML IJ SOLN
4.0000 mg | Freq: Once | INTRAMUSCULAR | Status: AC
  Administered 2022-05-11: 4 mg via INTRAVENOUS
  Filled 2022-05-11: qty 2

## 2022-05-11 MED ORDER — ENOXAPARIN SODIUM 40 MG/0.4ML IJ SOSY
40.0000 mg | PREFILLED_SYRINGE | INTRAMUSCULAR | Status: DC
  Administered 2022-05-11 – 2022-05-22 (×12): 40 mg via SUBCUTANEOUS
  Filled 2022-05-11 (×12): qty 0.4

## 2022-05-11 MED ORDER — IPRATROPIUM BROMIDE 0.02 % IN SOLN
0.5000 mg | Freq: Four times a day (QID) | RESPIRATORY_TRACT | Status: DC
  Administered 2022-05-11 (×3): 0.5 mg via RESPIRATORY_TRACT
  Filled 2022-05-11 (×3): qty 2.5

## 2022-05-11 NOTE — ED Notes (Addendum)
Lunch tray placed at bedside. Daughter at bedside with patient. Daughter requesting social work placement for patient go to rehab after discharge. Message sent to Blaine Hamper, MD with same.   Dietary called for soup as requested by patient.

## 2022-05-11 NOTE — Assessment & Plan Note (Addendum)
At baseline difficulty with ambulating secondary to arthritis in that hip and both knees.  Progressively weak and unable to ambulate.  Anticipate need for SNF placement. --PT OT evaluations, SNF recommended -- TOC consulted -- Fall precautions

## 2022-05-11 NOTE — Assessment & Plan Note (Signed)
Platelets on admission 119k, appears acute.  Suspect due to infection.  LDH normal and peripheral blood smear unremarkable.  Mental status normal. --Monitor CBC

## 2022-05-11 NOTE — Assessment & Plan Note (Signed)
Treated with empiric Rocephin. Urine culture grew E. coli sensitive to Rocephin and Ancef, resistant to ampicillin, Cipro, gentamicin, Unasyn. --Transition to Keflex to complete 5-day course

## 2022-05-11 NOTE — H&P (Addendum)
History and Physical    TAZ VANNESS XQJ:[id number] DOB: 25-Sep-1930 DOA: 05/11/2022  Referring MD/NP/PA:   PCP: [name], MD   Patient coming from:  The patient is coming from home.  At baseline, pt is independent for most of ADL.        Chief Complaint: cough, shortness breath, generalized weakness.  HPI: Jeffrey Dennis is a 88 y.o. male with medical history significant of HOH, HTN, HAV, bilateral hydronephrosis, urinary incontinence, BPH, CKD stage IIIa, GERD, gout, who presents with cough and shortness breath, generalized weakness.  Patient is very hard  ofhearing, history is limited. Patient states that his symptoms have been going on for several days, including dry cough, mild shortness of breath, congestion, mild fever and chills.  Patient also reports generalized weakness.  No unilateral numbness or tinglings in extremities.  No facial droop or slurred speech.  Patient states that he had several episodes of loose stool bowel movement yesterday, which has improved today.  No nausea, vomiting or abdominal pain.  Patient has increased urinary frequency, no dysuria. Had a positive COVID test at home.  Per her daughter, patient was started on prednisone, molnupiravir and Augmentin 2 days ago by PCP.  Daughter states that patient is so weak that he has difficult walking.  Data Reviewed and ED Course: pt was found to have WBC 7.5, positive COVID PCR, positive urinalysis (cloudy appearance, large amount of leukocyte, many bacteria, WBC> 50), troponin 10, 13, stable renal function.  Temperature normal, blood pressure 140/97, heart rate 101, 96, RR 20, oxygen saturation 96% on room air.  Chest x-ray showed mild left lower lobe infiltration.  Patient is admitted to telemetry bed as inpatient.   EKG: I have personally reviewed.  Sinus rhythm, QTc 447, RAD, right bundle blockade.   Review of Systems:   General: Has subjective fevers, chills, no body weight gain, has fatigue HEENT: no blurry  vision, hearing changes or sore throat Respiratory: has dyspnea, coughing, no wheezing CV: no chest pain, no palpitations GI: no nausea, vomiting, abdominal pain, diarrhea, constipation GU: no dysuria, burning on urination, has increased urinary frequency, no hematuria  Ext: no leg edema Neuro: no unilateral weakness, numbness, or tingling, no vision change or hearing loss Skin: no rash, no skin tear. MSK: No muscle spasm, no deformity, no limitation of range of movement in spin Heme: No easy bruising.  Travel history: No recent long distant travel.   Allergy:  Allergies  Allergen Reactions   Doxazosin Other (See Comments)   Telmisartan Other (See Comments)    Past Medical History:  Diagnosis Date   Arthritis    Bilateral hydronephrosis    GERD (gastroesophageal reflux disease)    Hearing decreased    History of hepatitis A    Hypertension    Incontinence of urine     Past Surgical History:  Procedure Laterality Date   CYSTOSCOPY W/ RETROGRADES  10/21/2012   Procedure: CYSTOSCOPY WITH RETROGRADE PYELOGRAM;  Surgeon: Malka So, MD;  Location: [facility];  Service: Urology;  Laterality: Bilateral;  cystoscopy bilateral retrograde  ureteral dilatation     NOSE SURGERY  1959   PROSTATE SURGERY     PROSTATECTOMY  2004   TONSILLECTOMY     TRANSURETHRAL RESECTION OF PROSTATE  10/21/2012   Procedure: TRANSURETHRAL RESECTION OF THE PROSTATE (TURP);  Surgeon: Malka So, MD;  Location: [facility];  Service: Urology;  Laterality: N/A;    Social History:  reports that he has never  smoked. He has never used smokeless tobacco. He reports that he does not drink alcohol and does not use drugs.  Family History:  Family History  Problem Relation Age of Onset   Stroke Father    Heart attack Father    Stroke Mother      Prior to Admission medications   Medication Sig Start Date End Date Taking? Authorizing Provider  colchicine 0.6 MG tablet Take by mouth. 02/07/22  Yes [provider]  labetalol (NORMODYNE) 100 MG tablet Take by mouth. 04/12/22  Yes [provider]  amoxicillin-clavulanate (AUGMENTIN) 500-125 MG tablet Take 1 tablet by mouth 2 (two) times daily. 05/09/22   [provider]  labetalol (NORMODYNE) 100 MG tablet Take 100 mg by mouth daily in the afternoon.  02/09/20   [provider]  LAGEVRIO 200 MG CAPS capsule Take by mouth. 05/04/22   [provider]  predniSONE (DELTASONE) 20 MG tablet Take 20 mg by mouth daily. 05/04/22   [provider]  tamsulosin (FLOMAX) 0.4 MG CAPS capsule Take 0.4 mg by mouth 2 (two) times daily. 01/27/20   [provider]    Physical Exam: Vitals:   05/11/22 1100 05/11/22 1200 05/11/22 1230 05/11/22 1330  BP: (!) 141/70 126/77 135/74 131/87  Pulse: 91 90 87 86  Resp: 17 (!) '21 18 20  '$ Temp:      TempSrc:      SpO2: 93% 96% 92% 95%  Weight:      Height:       General: Not in acute distress HEENT:       Eyes: PERRL, EOMI, no scleral icterus.       ENT: No discharge from the ears and nose, no pharynx injection, no tonsillar enlargement.        Neck: No JVD, no bruit, no mass felt. Heme: No neck lymph node enlargement. Cardiac: S1/S2, RRR, No murmurs, No gallops or rubs. Respiratory: No rales, wheezing, rhonchi or rubs. GI: Soft, nondistended, nontender, no rebound pain, no organomegaly, BS present. GU: No hematuria Ext: No pitting leg edema bilaterally. 1+DP/PT pulse bilaterally. Musculoskeletal: No joint deformities, No joint redness or warmth, no limitation of ROM in spin. Skin: No rashes.  Neuro: Alert, oriented X3, cranial nerves II-XII grossly intact, moves all extremities normally.  Psych: Patient is not psychotic, no suicidal or hemocidal ideation.  Labs on Admission: I have personally reviewed following labs and imaging studies  CBC: Recent Labs  Lab 05/10/22 2135  WBC 7.5  HGB 13.3  HCT 41.8  MCV 90.9  PLT 151*   Basic Metabolic  Panel: Recent Labs  Lab 05/10/22 2135  NA 134*  K 3.8  CL 101  CO2 23  GLUCOSE 105*  BUN 21  CREATININE 1.29*  CALCIUM 10.1   GFR: Estimated Creatinine Clearance: 45.7 mL/min (A) (by C-G formula based on SCr of 1.29 mg/dL (H)). Liver Function Tests: No results for input(s): "AST", "ALT", "ALKPHOS", "BILITOT", "PROT", "ALBUMIN" in the last 168 hours. No results for input(s): "LIPASE", "AMYLASE" in the last 168 hours. No results for input(s): "AMMONIA" in the last 168 hours. Coagulation Profile: No results for input(s): "INR", "PROTIME" in the last 168 hours. Cardiac Enzymes: No results for input(s): "CKTOTAL", "CKMB", "CKMBINDEX", "TROPONINI" in the last 168 hours. BNP (last 3 results) No results for input(s): "PROBNP" in the last 8760 hours. HbA1C: No results for input(s): "HGBA1C" in the last 72 hours. CBG: No results for input(s): "GLUCAP" in the last 168 hours. Lipid Profile:  No results for input(s): "CHOL", "HDL", "LDLCALC", "TRIG", "CHOLHDL", "LDLDIRECT" in the last 72 hours. Thyroid Function Tests: No results for input(s): "TSH", "T4TOTAL", "FREET4", "T3FREE", "THYROIDAB" in the last 72 hours. Anemia Panel: No results for input(s): "VITAMINB12", "FOLATE", "FERRITIN", "TIBC", "IRON", "RETICCTPCT" in the last 72 hours. Urine analysis:    Component Value Date/Time   COLORURINE YELLOW (A) 05/11/2022 0447   APPEARANCEUR CLOUDY (A) 05/11/2022 0447   LABSPEC 1.006 05/11/2022 0447   PHURINE 6.0 05/11/2022 0447   GLUCOSEU NEGATIVE 05/11/2022 0447   HGBUR SMALL (A) 05/11/2022 0447   BILIRUBINUR NEGATIVE 05/11/2022 0447   KETONESUR NEGATIVE 05/11/2022 0447   PROTEINUR NEGATIVE 05/11/2022 0447   UROBILINOGEN 0.2 10/23/2012 1620   NITRITE NEGATIVE 05/11/2022 0447   LEUKOCYTESUR LARGE (A) 05/11/2022 0447   Sepsis Labs: '@LABRCNTIP'$ (procalcitonin:4,lacticidven:4) ) Recent Results (from the past 240 hour(s))  SARS Coronavirus 2 by RT PCR (hospital order, performed in [facility] lab) *cepheid single result test* Anterior Nasal Swab     Status: Abnormal   Collection Time: 05/10/22  9:35 PM   Specimen: Anterior Nasal Swab  Result Value Ref Range Status   SARS Coronavirus 2 by RT PCR POSITIVE (A) NEGATIVE Final    Comment: (NOTE) SARS-CoV-2 target nucleic acids are DETECTED  SARS-CoV-2 RNA is generally detectable in upper respiratory specimens  during the acute phase of infection.  Positive results are indicative  of the presence of the identified virus, but do not rule out bacterial infection or co-infection with other pathogens not detected by the test.  Clinical correlation with patient history and  other diagnostic information is necessary to determine patient infection status.  The expected result is negative.  Fact Sheet for Patients:   https://www.patel.info/   Fact Sheet for Healthcare Providers:   https://hall.com/    This test is not yet approved or cleared by the [place] FDA and  has been authorized for detection and/or diagnosis of SARS-CoV-2 by FDA under an Emergency Use Authorization (EUA).  This EUA will remain in effect (meaning this test can be used) for the duration of  the COVID-19 declaration under Section 564(b)(1)  of the Act, 21 U.S.C. section 360-bbb-3(b)(1), unless the authorization is terminated or revoked sooner.   Performed at [facility], [address], [place], [place] [address]      Radiological Exams on Admission: DG Chest 1 View  Result Date: 05/10/2022 CLINICAL DATA:  Positive COVID.  Cough EXAM: CHEST  1 VIEW COMPARISON:  None Available. FINDINGS: Linear densities in the lingula at the left base, likely atelectasis. Right lung clear. Heart is normal size. No effusions or acute bony abnormality. IMPRESSION: Lingular/left basilar atelectasis. Electronically Signed   By: Rolm Baptise M.D.   On: 05/10/2022 22:12      Assessment/Plan Principal  Problem:   Pneumonia due to COVID-19 virus Active Problems:   UTI (urinary tract infection)   Hypertension   BPH (benign prostatic hyperplasia)   Thrombocytopenia (HCC)   Chronic kidney disease, stage 3a (HCC)   Generalized weakness    Assessment and Plan: * Pneumonia due to COVID-19 virus Chest x-ray showed mild left lower lobe infiltration.  Oxygen saturation 96% on room air.  No fever or leukocytosis now. -Admit to telemetry bed as inpatient -Paxlovid started in ED -Bronchodilators -As needed Mucinex  UTI (urinary tract infection) - IV Rocephin -Follow-up urine culture  Hypertension - IV hydralazine as needed -Continue home labetalol  BPH (benign prostatic hyperplasia) - Flomax  Thrombocytopenia (HCC) Platelet  119, this is a new issue.  Etiology is not clear, may be due to ongoing infection.  Mental status normal. -Check LDH and peripheral smear  Chronic kidney disease, stage 3a (HCC) Renal function stable.  Recent baseline creatinine 1.45 on 02/17/2020.  His creatinine is 1.29, BUN 21. -Follow-up with BMP  Generalized weakness -PT/OT -consult TOC for possible SNF placement             DVT ppx: SQ Lovenox  Code Status: DNR per his daughter  Family Communication: I called his daughter  Disposition Plan: to be determined, may need SNF  Consults called:  none  Admission status and Level of care: Telemetry Medical:   as inpt      Severity of Illness:  The appropriate patient status for this patient is INPATIENT. Inpatient status is judged to be reasonable and necessary in order to provide the required intensity of service to ensure the patient's safety. The patient's presenting symptoms, physical exam findings, and initial radiographic and laboratory data in the context of their chronic comorbidities is felt to place them at high risk for further clinical deterioration. Furthermore, it is not anticipated that the patient will be medically stable for  discharge from the hospital within 2 midnights of admission.   * I certify that at the point of admission it is my clinical judgment that the patient will require inpatient hospital care spanning beyond 2 midnights from the point of admission due to high intensity of service, high risk for further deterioration and high frequency of surveillance required.*       Date of Service 05/11/2022    Ivor Costa [facility]   If 7PM-7AM, please contact night-coverage [email or web address] 05/11/2022, 2:20 PM

## 2022-05-11 NOTE — ED Provider Notes (Signed)
[facility] Provider Note    Event Date/Time   First MD Initiated Contact with Patient 05/11/22 [phone]     (approximate)   History   Covid Exposure   HPI  Jeffrey Dennis is a 88 y.o. male with history of hypertension who presents to the emergency department with his daughter for several days of cough, congestion, fevers, shortness of breath, worsening generalized weakness.  Also having diarrhea but no vomiting or abdominal pain.  Had a positive COVID test at home.  Lives at home alone.  Does not wear oxygen chronically.   History provided by patient and daughter.    Past Medical History:  Diagnosis Date   Arthritis    Bilateral hydronephrosis    GERD (gastroesophageal reflux disease)    Hearing decreased    History of hepatitis A    Hypertension    Incontinence of urine     Past Surgical History:  Procedure Laterality Date   CYSTOSCOPY W/ RETROGRADES  10/21/2012   Procedure: CYSTOSCOPY WITH RETROGRADE PYELOGRAM;  Surgeon: Malka So, MD;  Location: [facility];  Service: Urology;  Laterality: Bilateral;  cystoscopy bilateral retrograde  ureteral dilatation     NOSE SURGERY  1959   PROSTATE SURGERY     PROSTATECTOMY  2004   TONSILLECTOMY     TRANSURETHRAL RESECTION OF PROSTATE  10/21/2012   Procedure: TRANSURETHRAL RESECTION OF THE PROSTATE (TURP);  Surgeon: Malka So, MD;  Location: [facility];  Service: Urology;  Laterality: N/A;    MEDICATIONS:  Prior to Admission medications   Medication Sig Start Date End Date Taking? Authorizing Provider  colchicine 0.6 MG tablet Take by mouth. 02/07/22  Yes [provider]  labetalol (NORMODYNE) 100 MG tablet Take by mouth. 04/12/22  Yes [provider]  amoxicillin-clavulanate (AUGMENTIN) 500-125 MG tablet Take 1 tablet by mouth 2 (two) times daily. 05/09/22   [provider]  labetalol (NORMODYNE) 100 MG tablet Take 100 mg by mouth daily in the afternoon.  02/09/20   [provider]  LAGEVRIO 200 MG CAPS capsule Take by mouth. 05/04/22   [provider]  predniSONE (DELTASONE) 20 MG tablet Take 20 mg by mouth daily. 05/04/22   [provider]  tamsulosin (FLOMAX) 0.4 MG CAPS capsule Take 0.4 mg by mouth 2 (two) times daily. 01/27/20   [provider]    Physical Exam   Triage Vital Signs: ED Triage Vitals  Enc Vitals Group     BP 05/10/22 2134 (!) 156/80     Pulse Rate 05/10/22 2134 84     Resp 05/10/22 2134 20     Temp 05/10/22 2134 98.4 F (36.9 C)     Temp Source 05/10/22 2134 Oral     SpO2 05/10/22 2134 94 %     Weight 05/10/22 2132 218 lb 4.1 oz (99 kg)     Height 05/10/22 2132 '5\' 11"'$  (1.803 m)     Head Circumference --      Peak Flow --      Pain Score 05/10/22 2132 5     Pain Loc --      Pain Edu? --      Excl. in [place]? --     Most recent vital signs: Vitals:   05/10/22 2134 05/11/22 0045  BP: (!) 156/80 (!) 156/91  Pulse: 84 91  Resp: 20 18  Temp: 98.4 F (36.9 C)   SpO2: 94% 96%    CONSTITUTIONAL: Alert and oriented  and responds appropriately to questions.  Elderly, hard of hearing, nontoxic HEAD: Normocephalic, atraumatic EYES: Conjunctivae clear, pupils appear equal, sclera nonicteric ENT: normal nose; moist mucous membranes NECK: Supple, normal ROM CARD: RRR; S1 and S2 appreciated; no murmurs, no clicks, no rubs, no gallops RESP: Normal chest excursion without splinting or tachypnea; breath sounds clear and equal bilaterally; no wheezes, no rhonchi, no rales, no hypoxia or respiratory distress, speaking full sentences ABD/GI: Normal bowel sounds; non-distended; soft, non-tender, no rebound, no guarding, no peritoneal signs BACK: The back appears normal EXT: Normal ROM in all joints; no deformity noted, no edema; no cyanosis SKIN: Normal color for age and race; warm; no rash on exposed skin NEURO: Moves all extremities equally, normal speech PSYCH: The patient's mood and manner are  appropriate.   ED Results / Procedures / Treatments   LABS: (all labs ordered are listed, but only abnormal results are displayed) Labs Reviewed  SARS CORONAVIRUS 2 BY RT PCR - Abnormal; Notable for the following components:      Result Value   SARS Coronavirus 2 by RT PCR POSITIVE (*)    All other components within normal limits  BASIC METABOLIC PANEL - Abnormal; Notable for the following components:   Sodium 134 (*)    Glucose, Bld 105 (*)    Creatinine, Ser 1.29 (*)    GFR, Estimated 53 (*)    All other components within normal limits  CBC - Abnormal; Notable for the following components:   Platelets 119 (*)    All other components within normal limits  URINALYSIS, ROUTINE W REFLEX MICROSCOPIC  TROPONIN I (HIGH SENSITIVITY)  TROPONIN I (HIGH SENSITIVITY)     EKG:  EKG Interpretation  Date/Time:  Thursday May 10 2022 21:35:59 EDT Ventricular Rate:  87 PR Interval:  216 QRS Duration: 140 QT Interval:  372 QTC Calculation: 447 R Axis:   133 Text Interpretation: Sinus rhythm with 1st degree A-V block Right bundle branch block Left posterior fascicular block **Bifascicular block ** Abnormal ECG When compared with ECG of 15-Feb-2020 11:51, No significant change was found Confirmed by Pryor Curia [phone]) on 05/11/2022 2:38:51 AM         RADIOLOGY: My personal review and interpretation of imaging: Chest x-ray shows possible left lower lobe pneumonia.  I have personally reviewed all radiology reports.   DG Chest 1 View  Result Date: 05/10/2022 CLINICAL DATA:  Positive COVID.  Cough EXAM: CHEST  1 VIEW COMPARISON:  None Available. FINDINGS: Linear densities in the lingula at the left base, likely atelectasis. Right lung clear. Heart is normal size. No effusions or acute bony abnormality. IMPRESSION: Lingular/left basilar atelectasis. Electronically Signed   By: Rolm Baptise M.D.   On: 05/10/2022 22:12     PROCEDURES:  Critical Care performed: No     .1-3 Lead  EKG Interpretation  Performed by: Ellakate Gonsalves, Delice Bison, DO Authorized by: Rhiannon Sassaman, Delice Bison, DO     Interpretation: normal     ECG rate:  91   ECG rate assessment: normal     Rhythm: sinus rhythm     Ectopy: none     Conduction: normal       IMPRESSION / MDM / ASSESSMENT AND PLAN / ED COURSE  I reviewed the triage vital signs and the nursing notes.    Patient here with complaints of cough, congestion, generalized weakness, shortness of breath, diarrhea.  Had a positive COVID-19 test at home.  The patient is on the cardiac monitor to evaluate  for evidence of arrhythmia and/or significant heart rate changes.   DIFFERENTIAL DIAGNOSIS (includes but not limited to):   COVID, pneumonia, anemia, electrolyte derangement, UTI, dehydration   Patient's presentation is most consistent with acute presentation with potential threat to life or bodily function.   PLAN: We will obtain CBC, BMP, troponin, urinalysis, repeat COVID swab, chest x-ray.  EKG shows bifascicular block.  Has known history of a right bundle branch block.  Not requiring oxygen at this time.  Patient unable to ambulate to check an ambulatory sat here.   MEDICATIONS GIVEN IN ED: Medications  melatonin tablet 5 mg (has no administration in time range)  nirmatrelvir/ritonavir EUA (renal dosing) (PAXLOVID) 2 tablet (has no administration in time range)  ondansetron (ZOFRAN) injection 4 mg (4 mg Intravenous Given 05/11/22 0238)     ED COURSE: Patient is positive for COVID-19.  Chest x-ray reviewed and interpreted by myself radiologist and shows possible left lower lobe pneumonia versus atelectasis.  He has chronic kidney disease which is stable but electrolytes normal, no leukocytosis or leukopenia.  Does have mild thrombocytopenia.  Troponin x2 negative.  Patient states he feels very ill and is worried about going home.  He states he is having severe congestion, headaches, diarrhea and shortness of breath.  His daughter reports he  has not been able to walk since he had a fall 8 weeks ago but has progressively gotten weaker especially since being sick.  Will discuss with hospitalist for admission and give Paxlovid.  Patient will likely benefit from physical therapy.   CONSULTS:  Consulted and discussed patient's case with hospitalist, Dr. Sidney Ace.  I have recommended admission and consulting physician agrees and will place admission orders.  Patient (and family if present) agree with this plan.   I reviewed all nursing notes, vitals, pertinent previous records.  All labs, EKGs, imaging ordered have been independently reviewed and interpreted by myself.    OUTSIDE RECORDS REVIEWED: Reviewed patient's last office visit with Emily Filbert on 02/07/2022       FINAL CLINICAL IMPRESSION(S) / ED DIAGNOSES   Final diagnoses:  COVID-19  Generalized weakness     Rx / DC Orders   ED Discharge Orders     None        Note:  This document was prepared using Dragon voice recognition software and may include unintentional dictation errors.   Kitiara Hintze, Delice Bison, DO 05/11/22 [phone]

## 2022-05-11 NOTE — Assessment & Plan Note (Addendum)
Renal function stable.  Recent baseline creatinine 1.45 on 02/17/2020.  His creatinine is 1.29, BUN 21. -Follow-up with BMP

## 2022-05-11 NOTE — Progress Notes (Signed)
Patient brought to unit from ED, joined by daughter at bedside. Vital sign indicate green [place], previously yellow in Ed. Patient oriented to unit, endorses no pain.

## 2022-05-11 NOTE — Assessment & Plan Note (Addendum)
Continue home labetalol As needed hydralazine

## 2022-05-11 NOTE — Assessment & Plan Note (Addendum)
Status post TURP in the past. --Continue Flomax

## 2022-05-11 NOTE — Assessment & Plan Note (Signed)
Chest x-ray showed mild left lower lobe infiltration.  Oxygen saturation 96% on room air.  No fever or leukocytosis now. -Admit to telemetry bed as inpatient -Paxlovid started in ED -Bronchodilators -As needed Mucinex

## 2022-05-11 NOTE — ED Notes (Signed)
Patient repositioned in bed at this time. Update on bed status given as requested by patient. No needs verbalized to RN. Call light within reach. NAD noted.

## 2022-05-12 DIAGNOSIS — U071 COVID-19: Secondary | ICD-10-CM | POA: Diagnosis not present

## 2022-05-12 DIAGNOSIS — J1282 Pneumonia due to coronavirus disease 2019: Secondary | ICD-10-CM | POA: Diagnosis not present

## 2022-05-12 LAB — BASIC METABOLIC PANEL
Anion gap: 5 (ref 5–15)
BUN: 29 mg/dL — ABNORMAL HIGH (ref 8–23)
CO2: 24 mmol/L (ref 22–32)
Calcium: 10.3 mg/dL (ref 8.9–10.3)
Chloride: 109 mmol/L (ref 98–111)
Creatinine, Ser: 1.43 mg/dL — ABNORMAL HIGH (ref 0.61–1.24)
GFR, Estimated: 47 mL/min — ABNORMAL LOW (ref >60)
Glucose, Bld: 87 mg/dL (ref 70–99)
Potassium: 4.5 mmol/L (ref 3.5–5.1)
Sodium: 138 mmol/L (ref 135–145)

## 2022-05-12 MED ORDER — SODIUM CHLORIDE 0.9 % IV SOLN: INTRAVENOUS | Status: DC

## 2022-05-12 MED ORDER — MELATONIN 5 MG PO TABS
5.0000 mg | ORAL_TABLET | Freq: Every day | ORAL | Status: DC
  Administered 2022-05-12 – 2022-05-21 (×10): 5 mg via ORAL
  Filled 2022-05-12 (×10): qty 1

## 2022-05-12 MED ORDER — SODIUM CHLORIDE 0.9 % IV SOLN: INTRAVENOUS | Status: DC | PRN

## 2022-05-12 NOTE — Evaluation (Signed)
Physical Therapy Evaluation Patient Details Name: Jeffrey Dennis MRN: [id number] DOB: [birth year] Today's Date: 05/12/2022  History of Present Illness  Pt is a 88 y/o M admitted on 05/11/22 after presenting with c/o cough, SOB, & generalized weakness. Pt with + Covid test at home a few days prior. Chest x-ray showed mild left lower lobe infiltration. Pt is being treated for PNA 2/2 Covid-19. PMH: HOH, HTN, HAV, B hydronephrosis, urinary incontinence, BPH, CKD stage 3A, GERD, gout, hepatitis A  Clinical Impression  Pt seen for PT evaluation with co-tx with OT. Pt received in bed reporting he cannot walk, hasn't ambulated in months, lives alone & has an aide that assists with bathing & bed<>rollator transfers & he uses a rollator move around the house. Pt actually did better than expected & transferred supine>sit with supervision with [facility] elevated & bed rails. Pt is fearful of using RW as he reports he had a fall resulting in neck fx when he used one in the past so pt completes STS & step pivot bed>recliner with min assist HHA +2. Therapists educated pt on recommendation of STR but pt reports he's used all his finances to pay for wife who is in memory care & while things are hard at home he is getting by & would rather go home. Notified case Freight forwarder.        Recommendations for follow up therapy are one component of a multi-disciplinary discharge planning process, led by the attending physician.  Recommendations may be updated based on patient status, additional functional criteria and insurance authorization.  Follow Up Recommendations Skilled nursing-short term rehab (<3 hours/day)      Assistance Recommended at Discharge Frequent or constant Supervision/Assistance  Patient can return home with the following  A lot of help with walking and/or transfers;A lot of help with bathing/dressing/bathroom;Assist for transportation;Assistance with cooking/housework;Help with stairs or ramp for entrance     Equipment Recommendations None recommended by PT  Recommendations for Other Services       Functional Status Assessment Patient has had a recent decline in their functional status and demonstrates the ability to make significant improvements in function in a reasonable and predictable amount of time.     Precautions / Restrictions Precautions Precautions: Fall Restrictions Weight Bearing Restrictions: No      Mobility  Bed Mobility Overal bed mobility: Modified Independent, Needs Assistance Bed Mobility: Supine to Sit     Supine to sit: Supervision, HOB elevated     General bed mobility comments: HOB significantly elevated, bed rails    Transfers Overall transfer level: Needs assistance Equipment used: 2 person hand held assist Transfers: Sit to/from Stand, Bed to chair/wheelchair/BSC Sit to Stand: Min assist, +2 physical assistance   Step pivot transfers: Min assist, +2 physical assistance            Ambulation/Gait                  Stairs            Wheelchair Mobility    Modified Rankin (Stroke Patients Only)       Balance Overall balance assessment: Needs assistance Sitting-balance support: Feet supported, Bilateral upper extremity supported Sitting balance-Leahy Scale: Fair Sitting balance - Comments: supervision static sitting   Standing balance support: During functional activity, Bilateral upper extremity supported Standing balance-Leahy Scale: Poor  Pertinent Vitals/Pain Pain Assessment Pain Assessment: No/denies pain    Home Living Family/patient expects to be discharged to:: Private residence Living Arrangements: Alone Available Help at Discharge: Personal care attendant Type of Home: House Home Access: Stairs to enter   Technical brewer of Steps: 1   Home Layout: One level        Prior Function Prior Level of Function : Needs assist             Mobility  Comments: Pt reports he hasn't ambulated in months, requires assistance for all OOB mobility, aide assists with transfer to rollator seat & that's how pt gets around house. ADLs Comments: Aide assists with bathing from bed level.     Hand Dominance        Extremity/Trunk Assessment   Upper Extremity Assessment Upper Extremity Assessment: Overall WFL for tasks assessed    Lower Extremity Assessment Lower Extremity Assessment: Generalized weakness    Cervical / Trunk Assessment Cervical / Trunk Assessment: Kyphotic  Communication   Communication: HOH  Cognition Arousal/Alertness: Awake/alert Behavior During Therapy: WFL for tasks assessed/performed Overall Cognitive Status: Within Functional Limits for tasks assessed                                          General Comments General comments (skin integrity, edema, etc.): O2 >/= 90% on room air    Exercises     Assessment/Plan    PT Assessment Patient needs continued PT services  PT Problem List Decreased strength;Decreased coordination;Decreased activity tolerance;Decreased balance;Decreased mobility;Decreased safety awareness;Decreased knowledge of use of DME       PT Treatment Interventions DME instruction;Therapeutic exercise;Gait training;Balance training;Stair training;Neuromuscular re-education;Functional mobility training;Therapeutic activities;Patient/family education    PT Goals (Current goals can be found in the Care Plan section)  Acute Rehab PT Goals Patient Stated Goal: to go home PT Goal Formulation: With patient Time For Goal Achievement: 05/26/22 Potential to Achieve Goals: Fair    Frequency Min 2X/week     Co-evaluation PT/OT/SLP Co-Evaluation/Treatment: Yes Reason for Co-Treatment: Complexity of the patient's impairments (multi-system involvement);For patient/therapist safety;Necessary to address cognition/behavior during functional activity PT goals addressed during session:  Mobility/safety with mobility;Balance         AM-PAC PT "6 Clicks" Mobility  Outcome Measure Help needed turning from your back to your side while in a flat bed without using bedrails?: A Little Help needed moving from lying on your back to sitting on the side of a flat bed without using bedrails?: A Little Help needed moving to and from a bed to a chair (including a wheelchair)?: A Lot Help needed standing up from a chair using your arms (e.g., wheelchair or bedside chair)?: A Lot Help needed to walk in hospital room?: A Lot Help needed climbing 3-5 steps with a railing? : Total 6 Click Score: 13    End of Session   Activity Tolerance: Patient tolerated treatment well Patient left: in chair;with chair alarm set;with call bell/phone within reach Nurse Communication: Mobility status PT Visit Diagnosis: Muscle weakness (generalized) (M62.81);Difficulty in walking, not elsewhere classified (R26.2);Other abnormalities of gait and mobility (R26.89);Unsteadiness on feet (R26.81)    Time: 6045-4098 PT Time Calculation (min) (ACUTE ONLY): 17 min   Charges:   PT Evaluation $PT Eval Moderate Complexity: [place], PT, DPT 05/12/22, 12:02 PM   [place]  Loleta Chance 05/12/2022, 12:00 PM

## 2022-05-12 NOTE — Evaluation (Signed)
Occupational Therapy Evaluation Patient Details Name: Jeffrey Dennis MRN: [id number] DOB: June 04, 1930 Today's Date: 05/12/2022   History of Present Illness Pt is a 88 year old male admitted with pneumonia due to COVID-19 virus. PMH significant for HOH, HTN, HAV, bilateral hydronephrosis, urinary incontinence, BPH, CKD stage IIIa, GERD, gout   Clinical Impression   Chart reviewed, RN cleared pt for participation in OT evaluation. Co tx completed with PT on this date. Pt reports he has been requiring assist for all ADL/IADL prior to admission including bathing in his bed and pushed around his house on his rollator. Pt endorses fear of falling. Bed mobility completed with supervision, STS with MIN A +2, step pivot transfer to bedside chair with MIN A +2. Pt declines use of RW states he fell using one and will not utilize it for ambulation on this date. MIN A required for UB dressing, MAX A for LB dressing. At this time pt presents with deficits in strength, endurance, activity tolerance all affecting safe and optimal ADL completion. Recommend discharge to STR to address functional deficits. Pt is left in bedside chair, NAD, all needs met. OT will follow acutely.       Recommendations for follow up therapy are one component of a multi-disciplinary discharge planning process, led by the attending physician.  Recommendations may be updated based on patient status, additional functional criteria and insurance authorization.   Follow Up Recommendations  Skilled nursing-short term rehab (<3 hours/day)    Assistance Recommended at Discharge Frequent or constant Supervision/Assistance  Patient can return home with the following A lot of help with walking and/or transfers;A lot of help with bathing/dressing/bathroom    Functional Status Assessment  Patient has had a recent decline in their functional status and demonstrates the ability to make significant improvements in function in a reasonable and  predictable amount of time.  Equipment Recommendations  Other (comment) (per next venue of care)    Recommendations for Other Services       Precautions / Restrictions Precautions Precautions: Fall Precaution Comments: covid + Restrictions Weight Bearing Restrictions: No      Mobility Bed Mobility Overal bed mobility: Needs Assistance Bed Mobility: Supine to Sit     Supine to sit: Supervision, HOB elevated          Transfers Overall transfer level: Needs assistance Equipment used: 2 person hand held assist Transfers: Sit to/from Stand Sit to Stand: Min assist, +2 physical assistance                  Balance Overall balance assessment: Needs assistance Sitting-balance support: Feet supported, Bilateral upper extremity supported Sitting balance-Leahy Scale: Fair     Standing balance support: During functional activity, Bilateral upper extremity supported Standing balance-Leahy Scale: Poor                             ADL either performed or assessed with clinical judgement   ADL Overall ADL's : Needs assistance/impaired                 Upper Body Dressing : Minimal assistance;Sitting Upper Body Dressing Details (indicate cue type and reason): doff shirt, donning gown Lower Body Dressing: Maximal assistance Lower Body Dressing Details (indicate cue type and reason): socks Toilet Transfer: Minimal assistance;+2 for physical assistance;+2 for safety/equipment Toilet Transfer Details (indicate cue type and reason): HHA simulted to bedside chair  Vision Patient Visual Report: No change from baseline       Perception     Praxis      Pertinent Vitals/Pain Pain Assessment Pain Assessment: No/denies pain     Hand Dominance     Extremity/Trunk Assessment Upper Extremity Assessment Upper Extremity Assessment: Overall WFL for tasks assessed   Lower Extremity Assessment Lower Extremity Assessment: Generalized  weakness   Cervical / Trunk Assessment Cervical / Trunk Assessment: Kyphotic   Communication Communication Communication: HOH   Cognition Arousal/Alertness: Awake/alert Behavior During Therapy: WFL for tasks assessed/performed Overall Cognitive Status: Within Functional Limits for tasks assessed                                 General Comments: pt alert and oriented, HOH; pt with significant fear of falling and minimally responsive to therapist education on progressing mobility/improving overall mobility     General Comments  spo2 above 90% on RA throughout evaluation    Exercises     Shoulder Instructions      Home Living Family/patient expects to be discharged to:: Private residence Living Arrangements: Alone Available Help at Discharge: Personal care attendant (pt reports he private pays for aids) Type of Home: House Home Access: Stairs to enter [employer] of Steps: 1   Home Layout: One level               Home Equipment: Rollator (4 wheels)          Prior Functioning/Environment Prior Level of Function : Needs assist       Physical Assist : ADLs (physical);Mobility (physical) Mobility (physical): Transfers;Gait;Stairs ADLs (physical): Grooming;Bathing;Dressing;Toileting;IADLs Mobility Comments: Pt reports he transfers to rollator and rides on that throughout the house with assist from aids; reports fear of falling and that he does not amb ADLs Comments: pt reports he is bathed in bed, assist for all ADL/IADL        OT Problem List: Decreased strength;Decreased activity tolerance      OT Treatment/Interventions: Self-care/ADL training;Patient/family education;Therapeutic exercise;Balance training;Energy conservation;Therapeutic activities;DME and/or AE instruction    OT Goals(Current goals can be found in the care plan section) Acute Rehab OT Goals Patient Stated Goal: go home OT Goal Formulation: With patient Time For  Goal Achievement: 05/26/22 Potential to Achieve Goals: Fair ADL Goals Pt Will Perform Grooming: with set-up;sitting Pt Will Transfer to Toilet: stand pivot transfer;with min assist;bedside commode Pt Will Perform Toileting - Clothing Manipulation and hygiene: with min assist;sit to/from stand  OT Frequency: Min 2X/week    Co-evaluation PT/OT/SLP Co-Evaluation/Treatment: Yes Reason for Co-Treatment: Complexity of the patient's impairments (multi-system involvement);For patient/therapist safety;To address functional/ADL transfers PT goals addressed during session: Mobility/safety with mobility;Balance OT goals addressed during session: ADL's and self-care      AM-PAC OT "6 Clicks" Daily Activity     Outcome Measure Help from another person eating meals?: None Help from another person taking care of personal grooming?: A Little Help from another person toileting, which includes using toliet, bedpan, or urinal?: A Lot Help from another person bathing (including washing, rinsing, drying)?: A Lot Help from another person to put on and taking off regular upper body clothing?: A Little Help from another person to put on and taking off regular lower body clothing?: A Lot 6 Click Score: 16   End of Session Nurse Communication: Mobility status  Activity Tolerance: Patient tolerated treatment well Patient left: in chair;with call bell/phone within reach;with  chair alarm set  OT Visit Diagnosis: Unsteadiness on feet (R26.81);Muscle weakness (generalized) (M62.81)                Time: 3817-7116 OT Time Calculation (min): 17 min Charges:  OT General Charges $OT Visit: 1 Visit OT Evaluation $OT Eval Moderate Complexity: 1 Mod  Shanon Payor, OTD OTR/L  05/12/22, 12:46 PM

## 2022-05-12 NOTE — Hospital Course (Signed)
88 year old male with past medical history of hearing impairment, hypertension, CKD stage IIIa, osteoarthritis, BPH s/p TURP overflow incontinence, gout, bilateral hydronephrosis presented to the ED on 05/11/2022 for evaluation of progressive generalized weakness cough, congestion, dry cough, fever/chills and shortness of breath for several days.  Also reports having loose stools yesterday.  He tested positive for COVID at home, as did his daughter.  The patient was started on prednisone, molnupiravir and Augmentin by PCP 2 days prior to presenting.  His weakness progressed to the point patient unable to ambulate.  In the ED, confirmed positive for COVID-19 by PCR.  UA was also consistent with infection.  Chest x-ray showed mild left lower lobe infiltration consistent with pneumonia.  Patient was admitted to the hospital, started on empiric Rocephin for UTI pending cultures and Paxlovid for COVID 19 infection.  He has had stable O2 sats on room air.  7/5: Remains stable.  Has to wait 10 days before going to SNF.

## 2022-05-12 NOTE — Plan of Care (Signed)
  Problem: Clinical Measurements: Goal: Ability to maintain a body temperature in the normal range will improve Outcome: Progressing   Problem: Respiratory: Goal: Ability to maintain adequate ventilation will improve Outcome: Progressing   Problem: Activity: Goal: Risk for activity intolerance will decrease Outcome: Progressing   Problem: Coping: Goal: Level of anxiety will decrease Outcome: Progressing   Problem: Pain Managment: Goal: General experience of comfort will improve Outcome: Progressing   Problem: Safety: Goal: Ability to remain free from injury will improve Outcome: Progressing

## 2022-05-12 NOTE — Progress Notes (Signed)
Progress Note   Patient: Jeffrey Dennis PJA:[id number] DOB: 06-06-1930 DOA: 05/11/2022     1 DOS: the patient was seen and examined on 05/12/2022   Brief hospital course: 88 year old male with past medical history of hearing impairment, hypertension, CKD stage IIIa, osteoarthritis, BPH s/p TURP overflow incontinence, gout, bilateral hydronephrosis presented to the ED on 05/11/2022 for evaluation of progressive generalized weakness cough, congestion, dry cough, fever/chills and shortness of breath for several days.  Also reports having loose stools yesterday.  He tested positive for COVID at home, as did his daughter.  The patient was started on prednisone, molnupiravir and Augmentin by PCP 2 days prior to presenting.  His weakness progressed to the point patient unable to ambulate.  In the ED, confirmed positive for COVID-19 by PCR.  UA was also consistent with infection.  Chest x-ray showed mild left lower lobe infiltration consistent with pneumonia.  Patient was admitted to the hospital, started on empiric Rocephin for UTI pending cultures and Paxlovid for COVID 19 infection.  He has had stable O2 sats on room air.  Assessment and Plan: * Pneumonia due to COVID-19 virus Chest x-ray showed mild left lower lobe infiltration.  Has not been hypoxic.  Afebrile and without leukocytosis --Continue Paxlovid -- Bronchodilators --Mucinex, Tylenol and other supportive care as needed -- Monitor oxygenation and supplement O2 if sats below 90%  UTI (urinary tract infection) Continue empiric Rocephin Follow-up urine culture  Hypertension Continue home labetalol As needed hydralazine  BPH (benign prostatic hyperplasia) Status post TURP in the past. --Continue Flomax  Thrombocytopenia ([place]) Platelets on admission 119k, appears acute.  Suspect due to infection.  LDH normal and peripheral blood smear unremarkable.  Mental status normal. --Monitor CBC  Chronic kidney disease, stage 3a ([place]) Renal  function stable on admission with creatinine 1.29.  Last baseline creatinine 1.45 on 02/17/2020.  --Follow-up BMP  Generalized weakness At baseline difficulty with ambulating secondary to arthritis in that hip and both knees.  Progressively weak and unable to ambulate.  Anticipate need for SNF placement. --PT OT evaluations -- TOC consulted -- Fall precautions        Subjective: Patient sitting up in recliner seen with daughter at bedside on rounds today.  He reports being very stopped up in his head with congestion.  Denies any shortness of breath.  Says feeling a little bit better.  Says he is completely unable to walk due to weakness.  Before weakness ambulating was difficult due to arthritis in the hip and knees.  Daughter reports patient so weak he is "like dead weight", requesting SNF for rehab and potentially long-term care.  Patient's wife is in memory care.  Physical Exam: Vitals:   05/12/22 0443 05/12/22 0827 05/12/22 1231 05/12/22 1518  BP: 125/60 (!) 122/51  136/66  Pulse: 79 74  80  Resp: '20 20  18  '$ Temp: (!) 97.5 F (36.4 C) 98 F (36.7 C)  97.9 F (36.6 C)  TempSrc: Oral     SpO2: 96% 97% 97% 96%  Weight:      Height:       General exam: awake, alert, no acute distress, conversational HEENT: Moderately hard of hearing, right-sided anterior cervical lymphadenopathy nontender, clear conjunctiva, anicteric sclera, moist mucus membranes Respiratory system: CTAB with intermittent wheeze, no rhonchi, normal respiratory effort at rest, on room air. Cardiovascular system: normal S1/S2, RRR, no JVD, no pedal edema.   Gastrointestinal system: soft, NT, ND, no HSM felt, +bowel sounds. Central nervous system: A&O x4.  no gross focal neurologic deficits aside from hearing impairment, normal speech Extremities: moves all, no edema,, scattered SKs and nevi Skin: dry, intact, normal temperature Psychiatry: normal mood, congruent affect, judgement and insight appear normal   Data  Reviewed:  Notable labs: BUN 29, creatinine 1.43, up slightly from 1.29.  LDH normal.  Peripheral blood smear unremarkable. Preliminary urine culture with E. coli.  Family Communication: Daughter at bedside on rounds  Disposition: Status is: Inpatient Remains inpatient appropriate because: Severity of illness remaining on empiric IV antibiotic coverage pending culture results.  Likely require SNF placement, PT OT evaluations pending.   Planned Discharge Destination: Skilled nursing facility    Time spent: 40 minutes  Author: Ezekiel Slocumb, DO 05/12/2022 5:43 PM  For on call review [email or web address].

## 2022-05-12 NOTE — Plan of Care (Signed)
  Problem: Respiratory: Goal: Ability to maintain adequate ventilation will improve Outcome: Progressing Goal: Ability to maintain a clear airway will improve Outcome: Progressing   Problem: Elimination: Goal: Will not experience complications related to bowel motility Outcome: Progressing Goal: Will not experience complications related to urinary retention Outcome: Progressing   Problem: Coping: Goal: Level of anxiety will decrease Outcome: Progressing

## 2022-05-13 DIAGNOSIS — U071 COVID-19: Secondary | ICD-10-CM | POA: Diagnosis not present

## 2022-05-13 DIAGNOSIS — J1282 Pneumonia due to coronavirus disease 2019: Secondary | ICD-10-CM | POA: Diagnosis not present

## 2022-05-13 LAB — COMPREHENSIVE METABOLIC PANEL
ALT: 27 U/L (ref 0–44)
AST: 18 U/L (ref 15–41)
Albumin: 3 g/dL — ABNORMAL LOW (ref 3.5–5.0)
Alkaline Phosphatase: 75 U/L (ref 38–126)
Anion gap: 6 (ref 5–15)
BUN: 30 mg/dL — ABNORMAL HIGH (ref 8–23)
CO2: 22 mmol/L (ref 22–32)
Calcium: 10.2 mg/dL (ref 8.9–10.3)
Chloride: 107 mmol/L (ref 98–111)
Creatinine, Ser: 1.37 mg/dL — ABNORMAL HIGH (ref 0.61–1.24)
GFR, Estimated: 49 mL/min — ABNORMAL LOW (ref >60)
Glucose, Bld: 88 mg/dL (ref 70–99)
Potassium: 3.6 mmol/L (ref 3.5–5.1)
Sodium: 135 mmol/L (ref 135–145)
Total Bilirubin: 0.8 mg/dL (ref 0.3–1.2)
Total Protein: 5.5 g/dL — ABNORMAL LOW (ref 6.5–8.1)

## 2022-05-13 LAB — CBC
HCT: 35.4 % — ABNORMAL LOW (ref 39.0–52.0)
Hemoglobin: 11.7 g/dL — ABNORMAL LOW (ref 13.0–17.0)
MCH: 29.3 pg (ref 26.0–34.0)
MCHC: 33.1 g/dL (ref 30.0–36.0)
MCV: 88.5 fL (ref 80.0–100.0)
Platelets: 107 10*3/uL — ABNORMAL LOW (ref 150–400)
RBC: 4 MIL/uL — ABNORMAL LOW (ref 4.22–5.81)
RDW: 13.3 % (ref 11.5–15.5)
WBC: 5.2 10*3/uL (ref 4.0–10.5)
nRBC: 0 % (ref 0.0–0.2)

## 2022-05-13 LAB — URINE CULTURE: Culture: >=100000 — AB

## 2022-05-13 LAB — MAGNESIUM: Magnesium: 1.7 mg/dL (ref 1.7–2.4)

## 2022-05-13 MED ORDER — CEPHALEXIN 500 MG PO CAPS
500.0000 mg | ORAL_CAPSULE | Freq: Two times a day (BID) | ORAL | Status: AC
Start: 2022-05-14 — End: 2022-05-15
  Administered 2022-05-14 – 2022-05-15 (×4): 500 mg via ORAL
  Filled 2022-05-13 (×4): qty 1

## 2022-05-13 MED ORDER — QUETIAPINE FUMARATE 25 MG PO TABS
25.0000 mg | ORAL_TABLET | Freq: Once | ORAL | Status: AC
  Administered 2022-05-13: 25 mg via ORAL
  Filled 2022-05-13: qty 1

## 2022-05-13 MED ORDER — ARTIFICIAL TEARS OPHTHALMIC OINT
TOPICAL_OINTMENT | OPHTHALMIC | Status: DC | PRN
  Filled 2022-05-13: qty 3.5

## 2022-05-13 MED ORDER — LORATADINE 10 MG PO TABS
10.0000 mg | ORAL_TABLET | Freq: Every day | ORAL | Status: DC
  Administered 2022-05-13 – 2022-05-22 (×10): 10 mg via ORAL
  Filled 2022-05-13 (×10): qty 1

## 2022-05-13 MED ORDER — FLUTICASONE PROPIONATE 50 MCG/ACT NA SUSP
2.0000 | Freq: Every day | NASAL | Status: DC
Start: 2022-05-13 — End: 2022-05-22
  Administered 2022-05-13 – 2022-05-22 (×10): 2 via NASAL
  Filled 2022-05-13: qty 16

## 2022-05-13 NOTE — Progress Notes (Signed)
       CROSS COVER NOTE  NAME: Jeffrey Dennis MRN: [id number] DOB : 05-Dec-1929    Date of Service   05/13/22  HPI/Events of Note   Secure chat received from nursing "patient is feeling very anxious and wants to go home he wanted to know if he could have something for his anxiety? daughter is at bedside and is also requested something for his nerves."  Interventions   Plan: 25 mg PO Seroquel x1    This document was prepared using Dragon voice recognition software and may include unintentional dictation errors.  Neomia Glass DNP, MHA, FNP-BC Nurse Practitioner [facility] Pager [phone]

## 2022-05-13 NOTE — Plan of Care (Signed)
  Problem: Respiratory: Goal: Ability to maintain adequate ventilation will improve Outcome: Progressing Goal: Ability to maintain a clear airway will improve Outcome: Progressing   Problem: Coping: Goal: Level of anxiety will decrease Outcome: Progressing   Problem: Elimination: Goal: Will not experience complications related to bowel motility Outcome: Progressing Goal: Will not experience complications related to urinary retention Outcome: Progressing   Problem: Pain Managment: Goal: General experience of comfort will improve Outcome: Progressing

## 2022-05-13 NOTE — Plan of Care (Signed)
Pt AAOx4, HOH. Currently no pain, VS are stable. Plan for Covid treatment/isolation and mobility as tolerated. Bed is in lowest position, call light within reach. Will continue to monitor.

## 2022-05-13 NOTE — Progress Notes (Signed)
Progress Note   Patient: Jeffrey Dennis ZOX:[id number] DOB: 1930/08/06 DOA: 05/11/2022     2 DOS: the patient was seen and examined on 05/13/2022   Brief hospital course: 88 year old male with past medical history of hearing impairment, hypertension, CKD stage IIIa, osteoarthritis, BPH s/p TURP overflow incontinence, gout, bilateral hydronephrosis presented to the ED on 05/11/2022 for evaluation of progressive generalized weakness cough, congestion, dry cough, fever/chills and shortness of breath for several days.  Also reports having loose stools yesterday.  He tested positive for COVID at home, as did his daughter.  The patient was started on prednisone, molnupiravir and Augmentin by PCP 2 days prior to presenting.  His weakness progressed to the point patient unable to ambulate.  In the ED, confirmed positive for COVID-19 by PCR.  UA was also consistent with infection.  Chest x-ray showed mild left lower lobe infiltration consistent with pneumonia.  Patient was admitted to the hospital, started on empiric Rocephin for UTI pending cultures and Paxlovid for COVID 19 infection.  He has had stable O2 sats on room air.  Assessment and Plan: * Pneumonia due to COVID-19 virus Chest x-ray showed mild left lower lobe infiltration.  Has not been hypoxic.  Afebrile and without leukocytosis --Continue Paxlovid -- Bronchodilators --Mucinex, Tylenol and other supportive care as needed --Add on Flonase and Claritin for congestion -- Monitor oxygenation and supplement O2 if sats below 90%  UTI (urinary tract infection) Treated with empiric Rocephin. Urine culture grew E. coli sensitive to Rocephin and Ancef, resistant to ampicillin, Cipro, gentamicin, Unasyn. --Transition to Keflex to complete 5-day course  Hypertension Continue home labetalol As needed hydralazine  BPH (benign prostatic hyperplasia) Status post TURP in the past. --Continue Flomax  Thrombocytopenia (HCC) Platelets on admission 119k,  appears acute.  Suspect due to infection.  LDH normal and peripheral blood smear unremarkable.  Mental status normal. --Monitor CBC  Chronic kidney disease, stage 3a ([place]) Renal function stable on admission with creatinine 1.29.  Last baseline creatinine 1.45 on 02/17/2020.  --Follow-up BMP  Generalized weakness At baseline difficulty with ambulating secondary to arthritis in that hip and both knees.  Progressively weak and unable to ambulate.  Anticipate need for SNF placement. --PT OT evaluations, SNF recommended -- TOC consulted -- Fall precautions        Subjective: Patient sitting up in bed when seen on rounds today.  He reports overall feeling a little bit better, head congestion slightly improved since yesterday.  No fevers or chills or shortness of breath.  Physical Exam: Vitals:   05/12/22 1518 05/12/22 1943 05/13/22 0453 05/13/22 0700  BP: 136/66 121/62 (!) 117/52 (!) 114/50  Pulse: 80 65 77 72  Resp: '18 20 20 20  '$ Temp: 97.9 F (36.6 C) (!) 97.5 F (36.4 C) 97.6 F (36.4 C) 97.9 F (36.6 C)  TempSrc:  Oral Oral Oral  SpO2: 96% 97% 96% 100%  Weight:      Height:       General exam: awake sitting up in bed, alert, no acute distress, conversational HEENT: Moderately hard of hearing, moist mucous membranes Respiratory system: Lungs clear bilaterally without wheezes or rhonchi, normal respiratory effort on room air Cardiovascular system: Rate and rhythm, no peripheral edema, DP pulses intact and symmetric Central nervous system: A&O x4. no gross focal neurologic deficits aside from hearing impairment, normal speech Extremities: moves all, no edema,, scattered SKs and nevi Psychiatry: normal mood, congruent affect, judgement and insight appear normal   Data Reviewed:  Notable  labs: Urine culture with E. coli sensitive to Rocephin, CMP with normal transaminases, creatinine 1.37 from 1.43, BUN 30, albumin 3.0  Family Communication: Daughter at bedside on  rounds  Disposition: Status is: Inpatient Remains inpatient appropriate because: Requires SNF placement which is pending.  Expect patient will require isolation for COVID-19 infection for 10 days prior to SNF discharge.   Planned Discharge Destination: Skilled nursing facility    Time spent: 35 minutes  Author: Ezekiel Slocumb, DO 05/13/2022 1:25 PM  For on call review [email or web address].

## 2022-05-13 NOTE — TOC CM/SW Note (Signed)
Patient is on COVID isolation. Patient is very hard of hearing. CSW attempted call to daughter, left VM. CSW asked RN if she can call CSW from patient's room when able.  Patient would not be able to go to SNF until COVID isolation is over.  Jeffrey Dennis, [place]

## 2022-05-14 DIAGNOSIS — J1282 Pneumonia due to coronavirus disease 2019: Secondary | ICD-10-CM | POA: Diagnosis not present

## 2022-05-14 DIAGNOSIS — G4700 Insomnia, unspecified: Secondary | ICD-10-CM | POA: Diagnosis present

## 2022-05-14 DIAGNOSIS — U071 COVID-19: Secondary | ICD-10-CM | POA: Diagnosis not present

## 2022-05-14 LAB — COMPREHENSIVE METABOLIC PANEL
ALT: 25 U/L (ref 0–44)
AST: 17 U/L (ref 15–41)
Albumin: 3.2 g/dL — ABNORMAL LOW (ref 3.5–5.0)
Alkaline Phosphatase: 74 U/L (ref 38–126)
Anion gap: 6 (ref 5–15)
BUN: 24 mg/dL — ABNORMAL HIGH (ref 8–23)
CO2: 23 mmol/L (ref 22–32)
Calcium: 10 mg/dL (ref 8.9–10.3)
Chloride: 108 mmol/L (ref 98–111)
Creatinine, Ser: 1.23 mg/dL (ref 0.61–1.24)
GFR, Estimated: 56 mL/min — ABNORMAL LOW (ref >60)
Glucose, Bld: 95 mg/dL (ref 70–99)
Potassium: 3.7 mmol/L (ref 3.5–5.1)
Sodium: 137 mmol/L (ref 135–145)
Total Bilirubin: 0.9 mg/dL (ref 0.3–1.2)
Total Protein: 5.5 g/dL — ABNORMAL LOW (ref 6.5–8.1)

## 2022-05-14 LAB — CBC
HCT: 34.3 % — ABNORMAL LOW (ref 39.0–52.0)
Hemoglobin: 11.2 g/dL — ABNORMAL LOW (ref 13.0–17.0)
MCH: 28.9 pg (ref 26.0–34.0)
MCHC: 32.7 g/dL (ref 30.0–36.0)
MCV: 88.6 fL (ref 80.0–100.0)
Platelets: 110 10*3/uL — ABNORMAL LOW (ref 150–400)
RBC: 3.87 MIL/uL — ABNORMAL LOW (ref 4.22–5.81)
RDW: 13.2 % (ref 11.5–15.5)
WBC: 5.4 10*3/uL (ref 4.0–10.5)
nRBC: 0 % (ref 0.0–0.2)

## 2022-05-14 LAB — MAGNESIUM: Magnesium: 1.7 mg/dL (ref 1.7–2.4)

## 2022-05-14 MED ORDER — ORAL CARE MOUTH RINSE
15.0000 mL | OROMUCOSAL | Status: DC | PRN
Start: 2022-05-14 — End: 2022-05-22

## 2022-05-14 MED ORDER — PNEUMOCOCCAL 20-VAL CONJ VACC 0.5 ML IM SUSY
0.5000 mL | PREFILLED_SYRINGE | INTRAMUSCULAR | Status: DC
  Filled 2022-05-14: qty 0.5

## 2022-05-14 MED ORDER — QUETIAPINE FUMARATE 25 MG PO TABS
25.0000 mg | ORAL_TABLET | Freq: Every day | ORAL | Status: DC
  Administered 2022-05-14 – 2022-05-21 (×8): 25 mg via ORAL
  Filled 2022-05-14 (×8): qty 1

## 2022-05-14 NOTE — Progress Notes (Signed)
Progress Note   Patient: Jeffrey Dennis [id number]:[id number] DOB: Oct 15, 1930 DOA: 05/11/2022     3 DOS: the patient was seen and examined on 05/14/2022   Brief hospital course: 88 year old male with past medical history of hearing impairment, hypertension, CKD stage IIIa, osteoarthritis, BPH s/p TURP overflow incontinence, gout, bilateral hydronephrosis presented to the ED on 05/11/2022 for evaluation of progressive generalized weakness cough, congestion, dry cough, fever/chills and shortness of breath for several days.  Also reports having loose stools yesterday.  He tested positive for COVID at home, as did his daughter.  The patient was started on prednisone, molnupiravir and Augmentin by PCP 2 days prior to presenting.  His weakness progressed to the point patient unable to ambulate.  In the ED, confirmed positive for COVID-19 by PCR.  UA was also consistent with infection.  Chest x-ray showed mild left lower lobe infiltration consistent with pneumonia.  Patient was admitted to the hospital, started on empiric Rocephin for UTI pending cultures and Paxlovid for COVID 19 infection.  He has had stable O2 sats on room air.  Assessment and Plan: * Pneumonia due to COVID-19 virus Chest x-ray showed mild left lower lobe infiltration.  Has not been hypoxic.  Afebrile and without leukocytosis --Continue Paxlovid -- Bronchodilators --Mucinex, Tylenol and other supportive care as needed --Started on Flonase and Claritin for congestion -- Monitor oxygenation and supplement O2 if sats below 90%  UTI (urinary tract infection) Treated with empiric Rocephin. Urine culture grew E. coli sensitive to Rocephin and Ancef, resistant to ampicillin, Cipro, gentamicin, Unasyn. --Transition to Keflex to complete 5-day course  Hypertension Continue home labetalol As needed hydralazine  BPH (benign prostatic hyperplasia) Status post TURP in the past. --Continue Flomax  Thrombocytopenia (HCC) Platelets on admission  119k, appears acute.  Suspect due to infection.  LDH normal and peripheral blood smear unremarkable.  Mental status normal. --Monitor CBC  Chronic kidney disease, stage 3a ([place]) Renal function stable on admission with creatinine 1.29.  Last baseline creatinine 1.45 on 02/17/2020.  --Follow-up BMP  Generalized weakness At baseline difficulty with ambulating secondary to arthritis in that hip and both knees.  Progressively weak and unable to ambulate.  Anticipate need for SNF placement. --PT OT evaluations, SNF recommended -- TOC consulted -- Fall precautions  Insomnia Given Seroquel overnight 7/2--3 due to inability to sleep and anxiety, responded very well.  Family feel it would benefit him longer term as this is also an issue at home. --Seroquel 25 mg PO at bedtime        Subjective: Patient sitting up edge of bed when seen today, eating lunch.  Reports still having pressure and congestion in his sinuses and ears.  Otherwise reports feeling better.  Feels he slept very well.  Given Seroquel overnight in cross coverage due to insomnia and anxiety.  Responded well and family feel he would benefit from it at bedtime.  Patient today is agreeable to rehab.   Physical Exam: Vitals:   05/13/22 1931 05/14/22 0605 05/14/22 0937 05/14/22 1529  BP: 123/67 135/65 134/72 104/65  Pulse: 72 69 83 67  Resp: '18 18 12 14  '$ Temp: 97.7 F (36.5 C) 97.7 F (36.5 C) 97.6 F (36.4 C) 97.9 F (36.6 C)  TempSrc: Oral     SpO2: 96% 95% 96% 94%  Weight:      Height:       General exam: awake sitting up in bed, alert, no acute distress, conversational HEENT: Moderately hard of hearing, moist mucous membranes  Respiratory system: Lungs clear bilaterally without wheezes or rhonchi, normal respiratory effort on room air Cardiovascular system: Rate and rhythm, no peripheral edema, DP pulses intact and symmetric Central nervous system: A&O x4. no gross focal neurologic deficits aside from hearing impairment,  normal speech Extremities: moves all, no edema,, scattered SKs and nevi Psychiatry: normal mood, congruent affect, judgement and insight appear normal   Data Reviewed:  Notable labs:  BUN 24 improved, Cr improved 1.23, Albumin 3.2, GFR 56, Hbg 11.2 stable, Plt 107 >> 110k  Family Communication: Daughter at bedside on rounds  Disposition: Status is: Inpatient Remains inpatient appropriate because: Requires SNF placement which is pending.  Expect patient will require isolation for COVID-19 infection for 10 days prior to SNF discharge.   Planned Discharge Destination: Skilled nursing facility    Time spent: 35 minutes  Author: Ezekiel Slocumb, DO 05/14/2022 5:01 PM  For on call review [email or web address].

## 2022-05-14 NOTE — Progress Notes (Signed)
Physical Therapy Treatment Patient Details Name: Jeffrey Dennis MRN: [id number] DOB: 1930/08/03 Today's Date: 05/14/2022   History of Present Illness Pt is a 88 year old male admitted with pneumonia due to COVID-19 virus. PMH significant for HOH, HTN, HAV, bilateral hydronephrosis, urinary incontinence, BPH, CKD stage IIIa, GERD, gout    PT Comments    Pt received in bed, daughter at bedside. Pt agreed to out of bed, Supine to sit with Supervision and HOB raised.  Pt declined use of RW due to recent fall with one and prefers to use his personal Rollator. Therefore, pt tolerated short distance shuffling steps to bedside chair with Min/Mod hand held assist. Pt anxious to d/c to SNF in order to improve strength and functional independence prior to returning home.    Recommendations for follow up therapy are one component of a multi-disciplinary discharge planning process, led by the attending physician.  Recommendations may be updated based on patient status, additional functional criteria and insurance authorization.  Follow Up Recommendations  Skilled nursing-short term rehab (<3 hours/day)     Assistance Recommended at Discharge Frequent or constant Supervision/Assistance  Patient can return home with the following A lot of help with walking and/or transfers;A lot of help with bathing/dressing/bathroom;Assist for transportation;Assistance with cooking/housework;Help with stairs or ramp for entrance   Equipment Recommendations  None recommended by PT    Recommendations for Other Services       Precautions / Restrictions Precautions Precautions: Fall Precaution Comments: covid + Restrictions Weight Bearing Restrictions: No     Mobility  Bed Mobility Overal bed mobility: Needs Assistance Bed Mobility: Supine to Sit     Supine to sit: Supervision, HOB elevated     General bed mobility comments: HOB significantly elevated, bed rails    Transfers Overall transfer level: Needs  assistance Equipment used: 1 person hand held assist Transfers: Sit to/from Stand Sit to Stand: Min assist, Mod assist   Step pivot transfers: Min assist, Mod assist            Ambulation/Gait Ambulation/Gait assistance: Min assist, Mod assist Gait Distance (Feet): 3 Feet Assistive device: 1 person hand held assist Gait Pattern/deviations: Step-to pattern, Shuffle       General Gait Details: Pt uses Rollator at home, refuses to use 2 wheeled RW in room   Stairs             Wheelchair Mobility    Modified Rankin (Stroke Patients Only)       Balance Overall balance assessment: Needs assistance Sitting-balance support: Feet supported, Bilateral upper extremity supported Sitting balance-Leahy Scale: Fair Sitting balance - Comments: supervision static sitting   Standing balance support: During functional activity, Bilateral upper extremity supported Standing balance-Leahy Scale: Poor                              Cognition Arousal/Alertness: Awake/alert Behavior During Therapy: WFL for tasks assessed/performed Overall Cognitive Status: Within Functional Limits for tasks assessed                                 General Comments: pt alert and oriented, HOH; pt with significant fear of falling and minimally responsive to therapist education on progressing mobility/improving overall mobility        Exercises      General Comments General comments (skin integrity, edema, etc.): Discussed benefits of out of bed since  pt initially declined, pt with good understanding and agrees to be compliant.      Pertinent Vitals/Pain Pain Assessment Pain Assessment: No/denies pain    Home Living                          Prior Function            PT Goals (current goals can now be found in the care plan section) Acute Rehab PT Goals Patient Stated Goal: to go home Progress towards PT goals: Progressing toward goals     Frequency    Min 2X/week      PT Plan Current plan remains appropriate    Co-evaluation              AM-PAC PT "6 Clicks" Mobility   Outcome Measure  Help needed turning from your back to your side while in a flat bed without using bedrails?: A Little Help needed moving from lying on your back to sitting on the side of a flat bed without using bedrails?: A Little Help needed moving to and from a bed to a chair (including a wheelchair)?: A Lot Help needed standing up from a chair using your arms (e.g., wheelchair or bedside chair)?: A Lot Help needed to walk in hospital room?: A Lot Help needed climbing 3-5 steps with a railing? : Total 6 Click Score: 13    End of Session Equipment Utilized During Treatment: Gait belt Activity Tolerance: Patient tolerated treatment well Patient left: in chair;with call bell/phone within reach;with chair alarm set;with family/visitor present Nurse Communication: Mobility status PT Visit Diagnosis: Muscle weakness (generalized) (M62.81);Difficulty in walking, not elsewhere classified (R26.2);Other abnormalities of gait and mobility (R26.89);Unsteadiness on feet (R26.81)     Time: 5697-9480 PT Time Calculation (min) (ACUTE ONLY): 18 min  Charges:  $Therapeutic Activity: 8-22 mins                    Jeffrey Dennis, PTA    Jeffrey Dennis 05/14/2022, 5:02 PM

## 2022-05-14 NOTE — Assessment & Plan Note (Signed)
Given Seroquel overnight 7/2--3 due to inability to sleep and anxiety, responded very well.  Family feel it would benefit him longer term as this is also an issue at home. --Seroquel 25 mg PO at bedtime

## 2022-05-14 NOTE — NC FL2 (Signed)
[place] LEVEL OF CARE SCREENING TOOL     IDENTIFICATION  Patient Name: Jeffrey Dennis Birthdate: 12/21/1929 Sex: male Admission Date (Current Location): 05/11/2022  [facility] and [place] Number:  Engineering geologist and Address:         Provider Number: [id number]  Attending Physician Name and Address:  Ezekiel Slocumb, DO  Relative Name and Phone Number:       Current Level of Care: Hospital Recommended Level of Care: [place] Prior Approval Number:    Date Approved/Denied:   PASRR Number: [id number]  Discharge Plan: SNF    Current Diagnoses: Patient Active Problem List   Diagnosis Date Noted   Pneumonia due to COVID-19 virus 05/11/2022   Hypertension    BPH (benign prostatic hyperplasia)    Thrombocytopenia ([place])    Chronic kidney disease, stage 3a ([place])    Generalized weakness    UTI (urinary tract infection) 02/15/2020   Urinary retention due to benign prostatic hyperplasia 02/15/2020   Gastritis 10/24/2012   Acute on chronic renal failure ([place]) 10/23/2012   Encephalopathy acute 10/23/2012   Leukocytosis 10/23/2012    Orientation RESPIRATION BLADDER Height & Weight     Self, Time, Situation, Place  Normal Incontinent, External catheter Weight: 99 kg Height:  '5\' 11"'$  (180.3 cm)  BEHAVIORAL SYMPTOMS/MOOD NEUROLOGICAL BOWEL NUTRITION STATUS      Continent Diet (Heart Healthy)  AMBULATORY STATUS COMMUNICATION OF NEEDS Skin   Limited Assist Verbally Normal                       Personal Care Assistance Level of Assistance              Functional Limitations Info  Sight, Hearing Sight Info: Impaired Hearing Info: Impaired      SPECIAL CARE FACTORS FREQUENCY  PT (By licensed PT), OT (By licensed OT)                    Contractures Contractures Info: Not present    Additional Factors Info  Isolation Precautions (covid positive 6/29) Code Status Info: DNR Allergies Info: Doxazosin, Telmisartan            Current Medications (05/14/2022):  This is the current hospital active medication list Current Facility-Administered Medications  Medication Dose Route Frequency Provider Last Rate Last Admin   0.9 %  sodium chloride infusion   Intravenous PRN Nicole Kindred A, DO   Stopped at 05/12/22 0831   acetaminophen (TYLENOL) tablet 650 mg  650 mg Oral Q6H PRN Ivor Costa, MD   650 mg at 05/14/22 0927   albuterol (PROVENTIL) (2.5 MG/3ML) 0.083% nebulizer solution 2.5 mg  2.5 mg Inhalation Q4H PRN Ivor Costa, MD       artificial tears (LACRILUBE) ophthalmic ointment   Both Eyes Q4H PRN Nicole Kindred A, DO       cephALEXin (KEFLEX) capsule 500 mg  500 mg Oral BID Nicole Kindred A, DO   500 mg at 05/14/22 4782   dextromethorphan-guaiFENesin (MUCINEX DM) 30-600 MG per 12 hr tablet 1 tablet  1 tablet Oral BID PRN Ivor Costa, MD       enoxaparin (LOVENOX) injection 40 mg  40 mg Subcutaneous Q24H Ivor Costa, MD   40 mg at 05/14/22 0926   fluticasone (FLONASE) 50 MCG/ACT nasal spray 2 spray  2 spray Each Nare Daily Nicole Kindred A, DO   2 spray at 05/14/22 0927   hydrALAZINE (APRESOLINE) injection 5  mg  5 mg Intravenous Q2H PRN Ivor Costa, MD       labetalol (NORMODYNE) tablet 100 mg  100 mg Oral Q1500 Ivor Costa, MD   100 mg at 05/13/22 1510   loperamide (IMODIUM) capsule 2 mg  2 mg Oral BID PRN Ivor Costa, MD       loratadine (CLARITIN) tablet 10 mg  10 mg Oral Daily Nicole Kindred A, DO   10 mg at 05/14/22 8088   melatonin tablet 5 mg  5 mg Oral QHS Sharion Settler, NP   5 mg at 05/13/22 2122   nirmatrelvir/ritonavir EUA (renal dosing) (PAXLOVID) 2 tablet  2 tablet Oral BID Ward, Delice Bison, DO   2 tablet at 05/14/22 0927   ondansetron (ZOFRAN) injection 4 mg  4 mg Intravenous Q8H PRN Ivor Costa, MD       pneumococcal 20-valent conjugate vaccine (PREVNAR 20) injection 0.5 mL  0.5 mL Intramuscular Tomorrow-1000 Nicole Kindred A, DO       QUEtiapine (SEROQUEL) tablet 25 mg  25 mg Oral QHS Nicole Kindred A, DO       tamsulosin (FLOMAX) capsule 0.4 mg  0.4 mg Oral BID Ivor Costa, MD   0.4 mg at 05/14/22 1103     Discharge Medications: Please see discharge summary for a list of discharge medications.  Relevant Imaging Results:  Relevant Lab Results:   Additional Information ss [phone]  Beverly Sessions, RN

## 2022-05-14 NOTE — Plan of Care (Signed)
Pt AAOx4, no pain/ VS are stable. Plan for mobility and pain control. Bed is in lowest position, call light within reach. Will continue to monitor.

## 2022-05-14 NOTE — TOC Initial Note (Signed)
Transition of Care [facility]) - Initial/Assessment Note    Patient Details  Name: Jeffrey Dennis MRN: [id number] Date of Birth: 01/21/1930  [facility] CM/SW Contact:    Beverly Sessions, RN Phone Number: 05/14/2022, 3:19 PM  Clinical Narrative:                  Assessment completed via phone due to airborne isolation  Admitted for: covid PNA Admitted from:home alone PCP: Sabra Heck  Therapy recommending SNF.  Daughter at bedside and in agreement.  Patient is still hesitant about committing but in agreement for bed search  PASRR obtained [place] sent for signature Bed search initiated  Expected Discharge Plan: [place] Barriers to Discharge: Continued Medical Work up   Patient Goals and CMS Choice        Expected Discharge Plan and Services Expected Discharge Plan: [place] arrangements for the past 2 months: [place]                                      Prior Living Arrangements/Services Living arrangements for the past 2 months: Single Family Home Lives with:: Self Patient language and need for interpreter reviewed:: Yes Do you feel safe going back to the place where you live?: Yes      Need for Family Participation in Patient Care: Yes (Comment) Care giver support system in place?: Yes (comment) Current home services: DME Criminal Activity/Legal Involvement Pertinent to Current Situation/Hospitalization: No - Comment as needed  Activities of Daily Living Home Assistive Devices/Equipment: None ADL Screening (condition at time of admission) Patient's cognitive ability adequate to safely complete daily activities?: No Is the patient deaf or have difficulty hearing?: Yes Does the patient have difficulty seeing, even when wearing glasses/contacts?: No Does the patient have difficulty concentrating, remembering, or making decisions?: No Patient able to express need for assistance with ADLs?:  Yes Does the patient have difficulty dressing or bathing?: Yes Independently performs ADLs?: No Communication: Independent Feeding: Independent Does the patient have difficulty walking or climbing stairs?: Yes Weakness of Legs: Both Weakness of Arms/Hands: None  Permission Sought/Granted                  Emotional Assessment       Orientation: : Oriented to Self, Oriented to Place, Oriented to  Time, Oriented to Situation Alcohol / Substance Use: Not Applicable Psych Involvement: No (comment)  Admission diagnosis:  Generalized weakness [R53.1] Pneumonia due to COVID-19 virus [U07.1, J12.82] COVID-19 [U07.1] Patient Active Problem List   Diagnosis Date Noted   Pneumonia due to COVID-19 virus 05/11/2022   Hypertension    BPH (benign prostatic hyperplasia)    Thrombocytopenia ([place])    Chronic kidney disease, stage 3a ([place])    Generalized weakness    UTI (urinary tract infection) 02/15/2020   Urinary retention due to benign prostatic hyperplasia 02/15/2020   Gastritis 10/24/2012   Acute on chronic renal failure ([place]) 10/23/2012   Encephalopathy acute 10/23/2012   Leukocytosis 10/23/2012   PCP:  [name], MD Pharmacy:   [facility] [place], [address] [place] [address] Phone: [phone] Fax: [phone]     Social Determinants of Health (SDOH) Interventions    Readmission Risk Interventions  No data to display

## 2022-05-15 DIAGNOSIS — J1282 Pneumonia due to coronavirus disease 2019: Secondary | ICD-10-CM | POA: Diagnosis not present

## 2022-05-15 DIAGNOSIS — U071 COVID-19: Secondary | ICD-10-CM | POA: Diagnosis not present

## 2022-05-15 LAB — BASIC METABOLIC PANEL
Anion gap: 5 (ref 5–15)
BUN: 25 mg/dL — ABNORMAL HIGH (ref 8–23)
CO2: 23 mmol/L (ref 22–32)
Calcium: 9.7 mg/dL (ref 8.9–10.3)
Chloride: 108 mmol/L (ref 98–111)
Creatinine, Ser: 1.35 mg/dL — ABNORMAL HIGH (ref 0.61–1.24)
GFR, Estimated: 50 mL/min — ABNORMAL LOW (ref >60)
Glucose, Bld: 105 mg/dL — ABNORMAL HIGH (ref 70–99)
Potassium: 3.8 mmol/L (ref 3.5–5.1)
Sodium: 136 mmol/L (ref 135–145)

## 2022-05-15 NOTE — Progress Notes (Signed)
Progress Note   Patient: Jeffrey Dennis JHE:[id number] DOB: Mar 01, 1930 DOA: 05/11/2022     4 DOS: the patient was seen and examined on 05/15/2022   Brief hospital course: 88 year old male with past medical history of hearing impairment, hypertension, CKD stage IIIa, osteoarthritis, BPH s/p TURP overflow incontinence, gout, bilateral hydronephrosis presented to the ED on 05/11/2022 for evaluation of progressive generalized weakness cough, congestion, dry cough, fever/chills and shortness of breath for several days.  Also reports having loose stools yesterday.  He tested positive for COVID at home, as did his daughter.  The patient was started on prednisone, molnupiravir and Augmentin by PCP 2 days prior to presenting.  His weakness progressed to the point patient unable to ambulate.  In the ED, confirmed positive for COVID-19 by PCR.  UA was also consistent with infection.  Chest x-ray showed mild left lower lobe infiltration consistent with pneumonia.  Patient was admitted to the hospital, started on empiric Rocephin for UTI pending cultures and Paxlovid for COVID 19 infection.  He has had stable O2 sats on room air.  Assessment and Plan: * Pneumonia due to COVID-19 virus Chest x-ray showed mild left lower lobe infiltration.  Has not been hypoxic.  Afebrile and without leukocytosis --Continue Paxlovid -- Bronchodilators --Mucinex, Tylenol and other supportive care as needed --Started on Flonase and Claritin for congestion -- Monitor oxygenation and supplement O2 if sats below 90%  UTI (urinary tract infection) Treated with empiric Rocephin. Urine culture grew E. coli sensitive to Rocephin and Ancef, resistant to ampicillin, Cipro, gentamicin, Unasyn. --Transition to Keflex to complete 5-day course  Hypertension Continue home labetalol As needed hydralazine  BPH (benign prostatic hyperplasia) Status post TURP in the past. --Continue Flomax  Thrombocytopenia (HCC) Platelets on admission  119k, appears acute.  Suspect due to infection.  LDH normal and peripheral blood smear unremarkable.  Mental status normal. --Monitor CBC  Chronic kidney disease, stage 3a ([place]) Renal function stable on admission with creatinine 1.29.  Last baseline creatinine 1.45 on 02/17/2020.  --Follow-up BMP  Generalized weakness At baseline difficulty with ambulating secondary to arthritis in that hip and both knees.  Progressively weak and unable to ambulate.  Anticipate need for SNF placement. --PT OT evaluations, SNF recommended -- TOC consulted -- Fall precautions  Insomnia Given Seroquel overnight 7/2--3 due to inability to sleep and anxiety, responded very well.  Family feel it would benefit him longer term as this is also an issue at home. --Seroquel 25 mg PO at bedtime        Subjective: Patient awake sitting up in bed, did appear drowsy.  He reports sleeping well overnight.  Still feels very congested in his ears and sinuses but otherwise reports overall feeling better.  Reports appetite is fair.  Remains agreeable to rehab.  Physical Exam: Vitals:   05/14/22 2134 05/15/22 0549 05/15/22 0844 05/15/22 1604  BP: 128/68 (!) 119/53 126/66 (!) 152/73  Pulse: 71 70 65 71  Resp: 16 (!) '22 20 18  '$ Temp: 98.1 F (36.7 C) (!) 97.4 F (36.3 C) (!) 97.4 F (36.3 C) (!) 97.5 F (36.4 C)  TempSrc:   Oral Oral  SpO2: 94% 96% 96% 99%  Weight:      Height:       General exam: awake sitting up in bed, alert, no acute distress, conversational HEENT: Moderate hard of hearing, moist mucous membranes, clear conjunctiva Respiratory system: normal respiratory effort on room air Cardiovascular system: Brisk cap refill, regular rate and rhythm by  radial pulse Central nervous system: A&O x4. no gross focal neurologic deficits aside from hearing impairment, normal speech Extremities: moves all, no edema,, scattered SKs and nevi Psychiatry: normal mood, congruent affect, judgement and insight appear  normal   Data Reviewed:  Notable labs: Glucose 105, BUN 25, creatinine 1.35 up slightly from 1.23 but still near baseline.  Family Communication: Daughter at bedside on rounds 7/2, 7/3  Disposition: Status is: Inpatient Remains inpatient appropriate because: Requires SNF placement which is pending.  Expect patient will require isolation for COVID-19 infection for 10 days prior to SNF discharge.   Planned Discharge Destination: Skilled nursing facility    Time spent: 25 minutes  Author: Ezekiel Slocumb, DO 05/15/2022 4:19 PM  For on call review [email or web address].

## 2022-05-15 NOTE — Plan of Care (Signed)

## 2022-05-16 DIAGNOSIS — U071 COVID-19: Secondary | ICD-10-CM | POA: Diagnosis not present

## 2022-05-16 DIAGNOSIS — J1282 Pneumonia due to coronavirus disease 2019: Secondary | ICD-10-CM | POA: Diagnosis not present

## 2022-05-16 NOTE — TOC Progression Note (Addendum)
Transition of Care [facility]) - Progression Note    Patient Details  Name: Jeffrey Dennis MRN: [id number] Date of Birth: August 15, 1930  Transition of Care [facility]) CM/SW [place], LCSW Phone Number: 05/16/2022, 10:22 AM  Clinical Narrative:  Patient still doesn't feel like he wants to go to SNF, he does not feel ready for that. He gave CSW permission to update his daughter Ivin Booty regarding bed offers. Left her a voicemail ([phone]).   10:54 am: Received call back from daughter Jacqlyn Larsen. Emailed her list of bed offers and link to [facility].  1:28 pm: Daughter has chosen [facility]. Will have to wait until day 11 from positive COVID test. Admissions coordinator is confirming this will be Monday.  Expected Discharge Plan: [place] Barriers to Discharge: Continued Medical Work up  Expected Discharge Plan and Services Expected Discharge Plan: [place] arrangements for the past 2 months: Single Family Home                                       Social Determinants of Health (SDOH) Interventions    Readmission Risk Interventions     No data to display

## 2022-05-16 NOTE — Progress Notes (Signed)
Progress Note   Patient: Jeffrey Dennis UXN:[id number] DOB: 04-15-1930 DOA: 05/11/2022     5 DOS: the patient was seen and examined on 05/16/2022   Brief hospital course: 88 year old male with past medical history of hearing impairment, hypertension, CKD stage IIIa, osteoarthritis, BPH s/p TURP overflow incontinence, gout, bilateral hydronephrosis presented to the ED on 05/11/2022 for evaluation of progressive generalized weakness cough, congestion, dry cough, fever/chills and shortness of breath for several days.  Also reports having loose stools yesterday.  He tested positive for COVID at home, as did his daughter.  The patient was started on prednisone, molnupiravir and Augmentin by PCP 2 days prior to presenting.  His weakness progressed to the point patient unable to ambulate.  In the ED, confirmed positive for COVID-19 by PCR.  UA was also consistent with infection.  Chest x-ray showed mild left lower lobe infiltration consistent with pneumonia.  Patient was admitted to the hospital, started on empiric Rocephin for UTI pending cultures and Paxlovid for COVID 19 infection.  He has had stable O2 sats on room air.  7/5: Remains stable.  Has to wait 10 days before going to SNF.   Assessment and Plan: * Pneumonia due to COVID-19 virus Chest x-ray showed mild left lower lobe infiltration.  Has not been hypoxic.  Afebrile and without leukocytosis.  On room air -- Completed a course of Paxlovid -- Bronchodilators --Mucinex, Tylenol and other supportive care as needed --Started on Flonase and Claritin for congestion   UTI (urinary tract infection) Treated with empiric Rocephin. Urine culture grew E. coli sensitive to Rocephin and Ancef, resistant to ampicillin, Cipro, gentamicin, Unasyn. --Transition to Keflex to complete 5-day course  Hypertension Continue home labetalol As needed hydralazine  BPH (benign prostatic hyperplasia) Status post TURP in the past. --Continue  Flomax  Thrombocytopenia (HCC) Platelets on admission 119k, appears acute.  Suspect due to infection.  LDH normal and peripheral blood smear unremarkable.  Mental status normal. --Monitor CBC  Chronic kidney disease, stage 3a ([place]) Renal function stable on admission with creatinine 1.29.  Last baseline creatinine 1.45 on 02/17/2020.  --Follow-up BMP  Generalized weakness At baseline difficulty with ambulating secondary to arthritis in that hip and both knees.  Progressively weak and unable to ambulate.  Anticipate need for SNF placement. --PT OT evaluations, SNF recommended -- TOC consulted -- Fall precautions  Insomnia Given Seroquel overnight 7/2--3 due to inability to sleep and anxiety, responded very well.  Family feel it would benefit him longer term as this is also an issue at home. --Seroquel 25 mg PO at bedtime   Subjective: Patient was seen and examined today.  No new complaints.  Eating his breakfast in the bed.  Physical Exam: Vitals:   05/15/22 1948 05/16/22 0513 05/16/22 1000 05/16/22 1501  BP: 131/69 123/67 (!) 136/58 126/78  Pulse: 71 83 71 70  Resp: '15 16 18 18  '$ Temp: 97.9 F (36.6 C) 97.8 F (36.6 C) 97.6 F (36.4 C) 97.8 F (36.6 C)  TempSrc:   Axillary Oral  SpO2: 99% 95% 97% 98%  Weight:      Height:       General.  Ill-appearing elderly gentleman, in no acute distress. Pulmonary.  Lungs clear bilaterally, normal respiratory effort. CV.  Regular rate and rhythm, no JVD, rub or murmur. Abdomen.  Soft, nontender, nondistended, BS positive. CNS.  Alert and oriented .  No focal neurologic deficit. Extremities.  No edema, no cyanosis, pulses intact and symmetrical. Psychiatry.  Judgment and  insight appears normal.  Data Reviewed: Prior data reviewed.  Family Communication: Discussed with daughter on phone.  Disposition: Status is: Inpatient Remains inpatient appropriate because: Waiting to complete 10 days for SNF, medically stable.   Planned  Discharge Destination: Skilled nursing facility  Time spent: 40 minutes  This record has been created using Systems analyst. Errors have been sought and corrected,but may not always be located. Such creation errors do not reflect on the standard of care.  Author: Lorella Nimrod, MD 05/16/2022 3:45 PM  For on call review [email or web address].

## 2022-05-16 NOTE — Progress Notes (Signed)
Occupational Therapy Treatment Patient Details Name: Jeffrey Dennis MRN: [id number] DOB: 07-28-1930 Today's Date: 05/16/2022   History of present illness Pt is a 88 year old male admitted with pneumonia due to COVID-19 virus. PMH significant for HOH, HTN, HAV, bilateral hydronephrosis, urinary incontinence, BPH, CKD stage IIIa, GERD, gout   OT comments  Upon entering the room, pt seated in recliner chair and agreeable to OT intervention. Pt is very hard of hearing throughout session. Pt stands from chair with mod lifting assistance. Pt appears very hearful and needing increase in cuing to attempt to push thru B UE on armrest vs. Pt wanting to pull on therapist to stand. Pt declined chair push ups during session. He was agreeable to B UE strengthening exercises with use of yellow resistive theraband. Pt performing 2 sets of 10 chest pulls, shoulder diagonals, shoulder elevation, and alternating punches. Pt needing min cuing for proper technique. OT left theraband in room with pt to utilize on his own when in room for strengthening and endurance. Pt verbalized understanding. All needs within reach. OT continues to recommend SNF at discharge to address functional deficits.     Recommendations for follow up therapy are one component of a multi-disciplinary discharge planning process, led by the attending physician.  Recommendations may be updated based on patient status, additional functional criteria and insurance authorization.    Follow Up Recommendations  Skilled nursing-short term rehab (<3 hours/day)    Assistance Recommended at Discharge Frequent or constant Supervision/Assistance  Patient can return home with the following  A lot of help with bathing/dressing/bathroom;A lot of help with walking and/or transfers;Assistance with cooking/housework;Help with stairs or ramp for entrance   Equipment Recommendations  Other (comment) (defer to next venue of care)       Precautions / Restrictions  Precautions Precautions: Fall Precaution Comments: covid +       Mobility Bed Mobility               General bed mobility comments: seated in recliner chair at beginning/end of session    Transfers Overall transfer level: Needs assistance Equipment used: 1 person hand held assist Transfers: Sit to/from Stand Sit to Stand: Mod assist                 Balance Overall balance assessment: Needs assistance Sitting-balance support: Feet supported, Bilateral upper extremity supported Sitting balance-Leahy Scale: Fair Sitting balance - Comments: supervision static sitting                                   ADL either performed or assessed with clinical judgement    Extremity/Trunk Assessment Upper Extremity Assessment Upper Extremity Assessment: Generalized weakness   Lower Extremity Assessment Lower Extremity Assessment: Generalized weakness        Vision Patient Visual Report: No change from baseline            Cognition Arousal/Alertness: Awake/alert Behavior During Therapy: WFL for tasks assessed/performed Overall Cognitive Status: Within Functional Limits for tasks assessed                                 General Comments: Pt is very hard of hearing                   Pertinent Vitals/ Pain       Pain Assessment Pain Assessment: No/denies pain  Frequency  Min 2X/week        Progress Toward Goals  OT Goals(current goals can now be found in the care plan section)  Progress towards OT goals: Progressing toward goals  Acute Rehab OT Goals Patient Stated Goal: to get stonger OT Goal Formulation: With patient Time For Goal Achievement: 05/26/22 Potential to Achieve Goals: [place] Discharge plan remains appropriate;Frequency remains appropriate       AM-PAC OT "6 Clicks" Daily Activity     Outcome Measure   Help from another person eating meals?: None Help from another person taking care of  personal grooming?: A Little Help from another person toileting, which includes using toliet, bedpan, or urinal?: A Lot Help from another person bathing (including washing, rinsing, drying)?: A Lot Help from another person to put on and taking off regular upper body clothing?: A Little Help from another person to put on and taking off regular lower body clothing?: A Lot 6 Click Score: 16    End of Session    OT Visit Diagnosis: Unsteadiness on feet (R26.81);Muscle weakness (generalized) (M62.81)   Activity Tolerance Patient tolerated treatment well   Patient Left in chair;with call bell/phone within reach;with chair alarm set   Nurse Communication Mobility status        Time: 5110-2111 OT Time Calculation (min): 17 min  Charges: OT General Charges $OT Visit: 1 Visit OT Treatments $Therapeutic Exercise: 8-22 mins  Darleen Crocker, MS, OTR/L , CBIS ascom [phone]  05/16/22, 4:23 PM

## 2022-05-16 NOTE — Progress Notes (Signed)
PT Cancellation Note  Patient Details Name: NYSHAWN GOWDY MRN: [id number] DOB: 03-14-1930   Cancelled Treatment:    Reason Eval/Treat Not Completed: Patient declined, no reason specified. Declines getting up or performing LE there ex at this time. Will re-attempt later as time allows.    Kadiatou Oplinger 05/16/2022, 11:12 AM

## 2022-05-16 NOTE — Progress Notes (Signed)
Mobility Specialist - Progress Note    05/16/22 1422  Mobility  Activity Ambulated with assistance in room;Stood at bedside;Dangled on edge of bed;Transferred from bed to chair  Level of Assistance +2 (takes two people)  Assistive Device Other (Comment) (HHA)  Distance Ambulated (ft) 3 ft  Activity Response Tolerated well  $Mobility charge 1 Mobility    Pt sitting EOB upon arrival using RA, requesting B-C transfer. Declines use of RW but voices need for HHA. Pt completes B-C transfer [place] +1 and is left in recliner with alarm set and needs in reach.  Merrily Brittle Mobility Specialist 05/16/22, 2:27 PM

## 2022-05-17 DIAGNOSIS — U071 COVID-19: Secondary | ICD-10-CM | POA: Diagnosis not present

## 2022-05-17 DIAGNOSIS — J1282 Pneumonia due to coronavirus disease 2019: Secondary | ICD-10-CM | POA: Diagnosis not present

## 2022-05-17 MED ORDER — MAGNESIUM HYDROXIDE 400 MG/5ML PO SUSP
30.0000 mL | Freq: Two times a day (BID) | ORAL | Status: DC | PRN
  Administered 2022-05-17: 30 mL via ORAL
  Filled 2022-05-17: qty 30

## 2022-05-17 NOTE — Progress Notes (Signed)
PT Cancellation Note  Patient Details Name: Jeffrey Dennis MRN: [id number] DOB: [birth year]   Cancelled Treatment:    Reason Eval/Treat Not Completed: Patient declined, no reason specified. Patient states he doesn't want to stand or do any LE exercises at this time. Will continue to follow.    Nova Evett 05/17/2022, 11:53 AM

## 2022-05-17 NOTE — TOC Progression Note (Addendum)
Transition of Care [facility]) - Progression Note    Patient Details  Name: Jeffrey Dennis MRN: [id number] Date of Birth: 02/13/1930  Transition of Care [facility]) CM/SW [place], LCSW Phone Number: 05/17/2022, 10:04 AM  Clinical Narrative: Jeffrey Dennis can accept patient on Tuesday. Will start auth Monday. Daughter Jeffrey Dennis is aware. Tried calling patient in the room but no answer. Will try again later.    12:19 pm: Updated patient.  Expected Discharge Plan: [place] Barriers to Discharge: Continued Medical Work up  Expected Discharge Plan and Services Expected Discharge Plan: [place] arrangements for the past 2 months: Single Family Home                                       Social Determinants of Health (SDOH) Interventions    Readmission Risk Interventions     No data to display

## 2022-05-17 NOTE — Progress Notes (Signed)
[facility] [place] NAME: Jeffrey Dennis    MR#:  [id number]  DATE OF BIRTH:  [birth year]  SUBJECTIVE:   patient hard on hearing. Eating breakfast earlier. Denies any complaints. No issues per RN   VITALS:  Blood pressure (!) 145/79, pulse 82, temperature 97.8 F (36.6 C), resp. rate 16, height '5\' 11"'$  (1.803 m), weight 99 kg, SpO2 97 %.  PHYSICAL EXAMINATION:   GENERAL:  88 y.o.-year-old patient lying in the bed with no acute distress. HOH+ LUNGS: Normal breath sounds bilaterally CARDIOVASCULAR: S1, S2 normal. No murmurs  ABDOMEN: Soft, nontender, nondistended. Bowel sounds present.  EXTREMITIES: No  edema b/l.    NEUROLOGIC: nonfocal  patient is alert and awake   LABORATORY PANEL:  CBC Recent Labs  Lab 05/14/22 0401  WBC 5.4  HGB 11.2*  HCT 34.3*  PLT 110*    Chemistries  Recent Labs  Lab 05/14/22 0401 05/15/22 0450  NA 137 136  K 3.7 3.8  CL 108 108  CO2 23 23  GLUCOSE 95 105*  BUN 24* 25*  CREATININE 1.23 1.35*  CALCIUM 10.0 9.7  MG 1.7  --   AST 17  --   ALT 25  --   ALKPHOS 74  --   BILITOT 0.9  --     Assessment and Plan  88 year old male with past medical history of hearing impairment, hypertension, CKD stage IIIa, osteoarthritis, BPH s/p TURP overflow incontinence, gout, bilateral hydronephrosis presented to the ED on 05/11/2022 for evaluation of progressive generalized weakness cough, congestion, dry cough, fever/chills and shortness of breath for several days.  Also reports having loose stools yesterday.  He tested positive for COVID at home, as did his daughter.  Pneumonia due to COVID-19 virus Chest x-ray showed mild left lower lobe infiltration.  Has not been hypoxic.  Afebrile and without leukocytosis.  On room air -- Completed a course of Paxlovid -- Bronchodilators --Mucinex, Tylenol and other supportive care as needed --on Flonase and Claritin for congestion   UTI (urinary tract  infection) Treated with empiric Rocephin. Urine culture grew E. coli sensitive to Rocephin and Ancef, resistant to ampicillin, Cipro, gentamicin, Unasyn. --completed 5-day course with keflex   Hypertension Continue home labetalol As needed hydralazine   BPH (benign prostatic hyperplasia) Status post TURP in the past. --Continue Flomax   Thrombocytopenia ([place]) Platelets on admission 119k, appears acute.  Suspect due to infection.  LDH normal and peripheral blood smear unremarkable.  Mental status at baseline   Chronic kidney disease, stage 3a ([place]) Renal function stable on admission with creatinine 1.29.  Last baseline creatinine 1.45 on 02/17/2020.    Generalized weakness At baseline difficulty with ambulating secondary to arthritis in that hip and both knees.  Progressively weak and unable to ambulate.  Anticipate need for SNF placement. --PT OT evaluations, SNF recommended -- Fall precautions   Insomnia Given Seroquel overnight 7/2--3 due to inability to sleep and anxiety, responded very well.  Family feel it would benefit him longer term as this is also an issue at home. --Seroquel 25 mg PO at bedtime    Procedures:none Family communication :none today Consults :none CODE STATUS: DNR DVT Prophylaxis :enoxaparin Level of care: Telemetry Medical Status is: Inpatient Remains inpatient appropriate because: per [facility] pt will discharge on next Tuesday July 11t hs after his 10 day isolation    TOTAL TIME TAKING CARE OF THIS PATIENT: 25 minutes.  >50% time spent on counselling and coordination  of care  Note: This dictation was prepared with Dragon dictation along with smaller phrase technology. Any transcriptional errors that result from this process are unintentional.  Fritzi Mandes M.D    [facility]   CC: Primary care physician; [name], MD

## 2022-05-18 DIAGNOSIS — J1282 Pneumonia due to coronavirus disease 2019: Secondary | ICD-10-CM | POA: Diagnosis not present

## 2022-05-18 DIAGNOSIS — U071 COVID-19: Secondary | ICD-10-CM | POA: Diagnosis not present

## 2022-05-18 MED ORDER — HYDROCODONE-ACETAMINOPHEN 5-325 MG PO TABS
1.0000 | ORAL_TABLET | Freq: Four times a day (QID) | ORAL | Status: DC | PRN
  Administered 2022-05-18 – 2022-05-19 (×2): 1 via ORAL
  Filled 2022-05-18 (×3): qty 1

## 2022-05-18 MED ORDER — TRAMADOL HCL 50 MG PO TABS
50.0000 mg | ORAL_TABLET | Freq: Four times a day (QID) | ORAL | Status: DC | PRN
  Administered 2022-05-18 – 2022-05-21 (×6): 50 mg via ORAL
  Filled 2022-05-18 (×6): qty 1

## 2022-05-18 NOTE — Progress Notes (Signed)
Physical Therapy Treatment Patient Details Name: Jeffrey Dennis MRN: [id number] DOB: 10-02-1930 Today's Date: 05/18/2022   History of Present Illness Pt is a 88 year old male admitted with pneumonia due to COVID-19 virus. PMH significant for HOH, HTN, HAV, bilateral hydronephrosis, urinary incontinence, BPH, CKD stage IIIa, GERD, gout    PT Comments    Pt was long sitting in bed finishing his breakfast upon arriving. He agrees to session with max encouragement. Pt seemed slightly anxious about OOB activity. Repeated several times during session that, "I would be able to do much better is I had my personal rollator from home." Pt was able to achieve EOB short sit with some assistance and stood 3 x EOB (~ 2 minutes).Unwilling to attempt ambulation or any activity away from EOB. Fear of falling? Pt will benefit from SNF at DC to address deficits while maximizing independence with all ADLs.     Recommendations for follow up therapy are one component of a multi-disciplinary discharge planning process, led by the attending physician.  Recommendations may be updated based on patient status, additional functional criteria and insurance authorization.  Follow Up Recommendations  Skilled nursing-short term rehab (<3 hours/day)     Assistance Recommended at Discharge Frequent or constant Supervision/Assistance  Patient can return home with the following A lot of help with walking and/or transfers;A lot of help with bathing/dressing/bathroom;Assist for transportation;Assistance with cooking/housework;Help with stairs or ramp for entrance   Equipment Recommendations  None recommended by PT       Precautions / Restrictions Precautions Precautions: Fall Precaution Comments: covid + Restrictions Weight Bearing Restrictions: No     Mobility  Bed Mobility Overal bed mobility: Needs Assistance Bed Mobility: Supine to Sit, Sit to Supine  Supine to sit: Min assist Sit to supine: Min assist  General  bed mobility comments: min assist to exit and re-enter bed. HOB was elevated throughout. pt unwilling to perform task as Chief Strategy Officer recommends    Transfers Overall transfer level: Needs assistance Equipment used: Rolling walker (2 wheels) Transfers: Sit to/from Stand Sit to Stand: Min assist, Mod assist    General transfer comment: pt was able to stand from EOB 3 x with encouragement throughout. Needs vcs for handplacement and increased fwd wt shift. pt multiple times says," I could do better with my personal rollator." Was able to stand for ~ 2 minutes each trial but unwilling to progress away from EOB.    Ambulation/Gait  General Gait Details: pt was unwilling to ambulate even with max encouargement to do so.    Balance Overall balance assessment: Needs assistance Sitting-balance support: Feet supported, Bilateral upper extremity supported Sitting balance-Leahy Scale: Fair Sitting balance - Comments: supervision static sitting   Standing balance support: During functional activity, Bilateral upper extremity supported Standing balance-Leahy Scale: Fair Standing balance comment: need UE support to maintain      Cognition Arousal/Alertness: Awake/alert Behavior During Therapy: WFL for tasks assessed/performed Overall Cognitive Status: Within Functional Limits for tasks assessed      General Comments: Pt is very hard of hearing. stubborn. Likes to dictate how session will progress.               Pertinent Vitals/Pain Pain Assessment Pain Assessment: No/denies pain     PT Goals (current goals can now be found in the care plan section) Acute Rehab PT Goals Patient Stated Goal: to go home Progress towards PT goals: Progressing toward goals    Frequency    Min 2X/week  PT Plan Current plan remains appropriate       AM-PAC PT "6 Clicks" Mobility   Outcome Measure  Help needed turning from your back to your side while in a flat bed without using bedrails?: A  Little Help needed moving from lying on your back to sitting on the side of a flat bed without using bedrails?: A Little Help needed moving to and from a bed to a chair (including a wheelchair)?: A Lot Help needed standing up from a chair using your arms (e.g., wheelchair or bedside chair)?: A Lot Help needed to walk in hospital room?: A Lot Help needed climbing 3-5 steps with a railing? : Total 6 Click Score: 13    End of Session   Activity Tolerance: Patient tolerated treatment well Patient left: in bed;with call bell/phone within reach;with bed alarm set Nurse Communication: Mobility status PT Visit Diagnosis: Muscle weakness (generalized) (M62.81);Difficulty in walking, not elsewhere classified (R26.2);Other abnormalities of gait and mobility (R26.89);Unsteadiness on feet (R26.81)     Time: 1006-1030 PT Time Calculation (min) (ACUTE ONLY): 24 min  Charges:  $Therapeutic Activity: 23-37 mins                     Julaine Fusi PTA 05/18/22, 4:43 PM

## 2022-05-18 NOTE — Progress Notes (Signed)
Occupational Therapy Treatment Patient Details Name: Jeffrey Dennis MRN: [id number] DOB: 04-21-1930 Today's Date: 05/18/2022   History of present illness Pt is a 88 year old male admitted with pneumonia due to COVID-19 virus. PMH significant for HOH, HTN, HAV, bilateral hydronephrosis, urinary incontinence, BPH, CKD stage IIIa, GERD, gout   OT comments  Upon entering the room, pt seated in recliner chair and reporting increased pain in B knees. RN is aware of pt asking for pain medication. Pt reports, " I think I have an infection in my knee caps. I just know that I do". Difficult to redirect pt during this session. OT offered to assist pt with repositioning and getting back into bed but pt declined anticipating increase in pain with standing. Pt is able to utilize B arm rest of chair and scoot self back and reposition in chair to elevate legs and asking for ice. Ice bags applied to B knees per pt request and discussed it staying on for ~ 20 minutes. Visitor arriving to pt's room and pt declined further therapy at this time. Theraband is on tray table and pt reports continued use in room. Pt is making slow progress towards therapy goals. Continued recommendation for SNF at discharge before returning home.    Recommendations for follow up therapy are one component of a multi-disciplinary discharge planning process, led by the attending physician.  Recommendations may be updated based on patient status, additional functional criteria and insurance authorization.    Follow Up Recommendations  Skilled nursing-short term rehab (<3 hours/day)    Assistance Recommended at Discharge Frequent or constant Supervision/Assistance  Patient can return home with the following  A lot of help with bathing/dressing/bathroom;A lot of help with walking and/or transfers;Assistance with cooking/housework;Help with stairs or ramp for entrance   Equipment Recommendations  Other (comment) (defer to next venue of care)        Precautions / Restrictions Precautions Precautions: Fall Precaution Comments: covid + Restrictions Weight Bearing Restrictions: No              ADL either performed or assessed with clinical judgement    Extremity/Trunk Assessment Upper Extremity Assessment Upper Extremity Assessment: Generalized weakness   Lower Extremity Assessment Lower Extremity Assessment: Generalized weakness        Vision Patient Visual Report: No change from baseline            Cognition Arousal/Alertness: Awake/alert Behavior During Therapy: WFL for tasks assessed/performed Overall Cognitive Status: Within Functional Limits for tasks assessed                                 General Comments: Pt is very hard of hearing. stubborn. Likes to dictate how session will progress. Difficult to redirect                   Pertinent Vitals/ Pain       Pain Assessment Pain Assessment: Faces Faces Pain Scale: Hurts even more Pain Location: B knees Pain Descriptors / Indicators: Aching, Discomfort         Frequency  Min 2X/week        Progress Toward Goals  OT Goals(current goals can now be found in the care plan section)  Progress towards OT goals: Progressing toward goals  Acute Rehab OT Goals Patient Stated Goal: to get stronger OT Goal Formulation: With patient Time For Goal Achievement: 05/26/22 Potential to Achieve Goals: [place] Discharge  plan remains appropriate;Frequency remains appropriate       AM-PAC OT "6 Clicks" Daily Activity     Outcome Measure   Help from another person eating meals?: None Help from another person taking care of personal grooming?: A Little Help from another person toileting, which includes using toliet, bedpan, or urinal?: A Lot Help from another person bathing (including washing, rinsing, drying)?: A Lot Help from another person to put on and taking off regular upper body clothing?: A Little Help from another person  to put on and taking off regular lower body clothing?: A Lot 6 Click Score: 16    End of Session    OT Visit Diagnosis: Unsteadiness on feet (R26.81);Muscle weakness (generalized) (M62.81)   Activity Tolerance Patient limited by pain   Patient Left in chair;with call bell/phone within reach;with chair alarm set   Nurse Communication Mobility status        Time: 1535-1550 OT Time Calculation (min): 15 min  Charges: OT General Charges $OT Visit: 1 Visit OT Treatments $Therapeutic Activity: 8-22 mins  Darleen Crocker, MS, OTR/L , CBIS ascom [phone]  05/18/22, 4:40 PM

## 2022-05-18 NOTE — Progress Notes (Signed)
[facility] [place] NAME: Jeffrey Dennis    MR#:  [id number]  DATE OF BIRTH:  [birth year]  SUBJECTIVE:   patient hard on hearing. C/o knee pain  No issues per RN   VITALS:  Blood pressure 121/63, pulse 81, temperature 98 F (36.7 C), temperature source Axillary, resp. rate 18, height '5\' 11"'$  (1.803 m), weight 99 kg, SpO2 96 %.  PHYSICAL EXAMINATION:   GENERAL:  88 y.o.-year-old patient lying in the bed with no acute distress. HOH+ LUNGS: Normal breath sounds bilaterally CARDIOVASCULAR: S1, S2 normal. No murmurs  ABDOMEN: Soft, nontender, nondistended. Bowel sounds present.  EXTREMITIES: No  edema b/l.    NEUROLOGIC: nonfocal  patient is alert and awake   LABORATORY PANEL:  CBC Recent Labs  Lab 05/14/22 0401  WBC 5.4  HGB 11.2*  HCT 34.3*  PLT 110*     Chemistries  Recent Labs  Lab 05/14/22 0401 05/15/22 0450  NA 137 136  K 3.7 3.8  CL 108 108  CO2 23 23  GLUCOSE 95 105*  BUN 24* 25*  CREATININE 1.23 1.35*  CALCIUM 10.0 9.7  MG 1.7  --   AST 17  --   ALT 25  --   ALKPHOS 74  --   BILITOT 0.9  --      Assessment and Plan  88 year old male with past medical history of hearing impairment, hypertension, CKD stage IIIa, osteoarthritis, BPH s/p TURP overflow incontinence, gout, bilateral hydronephrosis presented to the ED on 05/11/2022 for evaluation of progressive generalized weakness cough, congestion, dry cough, fever/chills and shortness of breath for several days.  Also reports having loose stools yesterday.  He tested positive for COVID at home, as did his daughter.  Pneumonia due to COVID-19 virus Chest x-ray showed mild left lower lobe infiltration.  Has not been hypoxic.  Afebrile and without leukocytosis.  On room air -- Completed a course of Paxlovid -- Bronchodilators --Mucinex, Tylenol and other supportive care as needed --on Flonase and Claritin for congestion   UTI (urinary tract infection) Treated  with empiric Rocephin. Urine culture grew E. coli sensitive to Rocephin and Ancef, resistant to ampicillin, Cipro, gentamicin, Unasyn. --completed 5-day course with keflex   Hypertension Continue home labetalol As needed hydralazine   BPH (benign prostatic hyperplasia) Status post TURP in the past. --Continue Flomax   Thrombocytopenia ([place]) Platelets on admission 119k, appears acute.  Suspect due to infection.  LDH normal and peripheral blood smear unremarkable.  Mental status at baseline   Chronic kidney disease, stage 3a ([place]) Renal function stable on admission with creatinine 1.29.  Last baseline creatinine 1.45 on 02/17/2020.    Generalized weakness At baseline difficulty with ambulating secondary to arthritis in that hip and both knees.  Progressively weak and unable to ambulate.  Anticipate need for SNF placement. --PT OT evaluations, SNF recommended -- Fall precautions --prn tramadol for pain   Insomnia Given Seroquel overnight 7/2--3 due to inability to sleep and anxiety, responded very well.  Family feel it would benefit him longer term as this is also an issue at home. --Seroquel 25 mg PO at bedtime    Procedures:none Family communication :none today Consults :none CODE STATUS: DNR DVT Prophylaxis :enoxaparin Level of care: Telemetry Medical Status is: Inpatient Remains inpatient appropriate because: per [facility] pt will discharge on next Tuesday July 11t hs after his 10 day isolation    TOTAL TIME TAKING CARE OF THIS PATIENT: 25 minutes.  >50%  time spent on counselling and coordination of care  Note: This dictation was prepared with Dragon dictation along with smaller phrase technology. Any transcriptional errors that result from this process are unintentional.  Fritzi Mandes M.D    [facility]   CC: Primary care physician; [name], MD

## 2022-05-19 DIAGNOSIS — U071 COVID-19: Secondary | ICD-10-CM | POA: Diagnosis not present

## 2022-05-19 DIAGNOSIS — I10 Essential (primary) hypertension: Secondary | ICD-10-CM | POA: Diagnosis not present

## 2022-05-19 DIAGNOSIS — G4700 Insomnia, unspecified: Secondary | ICD-10-CM

## 2022-05-19 DIAGNOSIS — N3000 Acute cystitis without hematuria: Secondary | ICD-10-CM | POA: Diagnosis not present

## 2022-05-19 DIAGNOSIS — D696 Thrombocytopenia, unspecified: Secondary | ICD-10-CM | POA: Diagnosis not present

## 2022-05-19 DIAGNOSIS — K5900 Constipation, unspecified: Secondary | ICD-10-CM

## 2022-05-19 MED ORDER — POLYETHYLENE GLYCOL 3350 17 G PO PACK
17.0000 g | PACK | Freq: Every day | ORAL | Status: DC
  Administered 2022-05-19 – 2022-05-22 (×4): 17 g via ORAL
  Filled 2022-05-19 (×4): qty 1

## 2022-05-19 NOTE — Progress Notes (Signed)
  Progress Note   Patient: Jeffrey Dennis [id number] DOB: 1930/11/04 DOA: 05/11/2022     8 DOS: the patient was seen and examined on 05/19/2022     Assessment and Plan: * Pneumonia due to COVID-19 virus Awaiting 10-day isolation ends prior to going out to rehab.   UTI (urinary tract infection) Completed 5 days of antibiotics.  Hypertension Continue labetalol  BPH (benign prostatic hyperplasia) Continue Flomax  Thrombocytopenia (HCC) Last platelet count 110  Chronic kidney disease, stage 3a (HCC) Last creatinine 1.35 with a GFR 50  Generalized weakness Physical therapy recommending rehab  Constipation Start MiraLAX  Insomnia On Seroquel and melatonin at night.  Explained that Seroquel does have a black box warning but family thinks it is helpful for him to sleep.  We will continue at this point.        Subjective: Patient today seen sitting in the chair.  Feels okay.  Does complain of right hip pain and left knee pain.  Initially came in with cough and shortness of breath and found to have pneumonia secondary to COVID-19 virus infection.  Physical Exam: Vitals:   05/18/22 1943 05/19/22 0459 05/19/22 0758 05/19/22 1518  BP: 127/66 111/71 124/68 132/65  Pulse: 80 84 80 69  Resp: '18 18 18 18  '$ Temp: 97.6 F (36.4 C) 98.1 F (36.7 C) 98 F (36.7 C)   TempSrc:      SpO2: 99% 95% 95% 97%  Weight:      Height:       Physical Exam HENT:     Head: Normocephalic.     Mouth/Throat:     Pharynx: No oropharyngeal exudate.  Eyes:     General: Lids are normal.     Conjunctiva/sclera: Conjunctivae normal.  Cardiovascular:     Rate and Rhythm: Normal rate and regular rhythm.     Heart sounds: Normal heart sounds, S1 normal and S2 normal.  Pulmonary:     Breath sounds: No decreased breath sounds, wheezing, rhonchi or rales.  Abdominal:     Palpations: Abdomen is soft.     Tenderness: There is no abdominal tenderness.  Musculoskeletal:     Right lower leg:  Swelling present.     Left lower leg: Swelling present.     Comments: Good range of motion left knee.  Skin:    General: Skin is warm.     Findings: No rash.  Neurological:     Mental Status: He is alert and oriented to person, place, and time.     Data Reviewed: Creatinine 1.35 with a GFR 50, last hemoglobin 11.2, platelet count 110  Family Communication: Updated daughter on the phone  Disposition: Status is: Inpatient Remains inpatient appropriate because: Awaiting 10-day isolation ends prior to going out to rehab  Planned Discharge Destination: Rehab   Author: Loletha Grayer, MD 05/19/2022 5:00 PM  For on call review [email or web address].

## 2022-05-19 NOTE — Assessment & Plan Note (Signed)
Start MiraLAX 

## 2022-05-19 NOTE — Assessment & Plan Note (Signed)
On Seroquel and melatonin at night.  Explained that Seroquel does have a black box warning but family thinks it is helpful for him to sleep.  We will continue at this point.

## 2022-05-19 NOTE — Assessment & Plan Note (Signed)
Last creatinine 1.35 with a GFR 50

## 2022-05-19 NOTE — Assessment & Plan Note (Signed)
-

## 2022-05-19 NOTE — Assessment & Plan Note (Addendum)
Platelet count back in the normal range at 155

## 2022-05-19 NOTE — Assessment & Plan Note (Signed)
-   Continue Flomax 

## 2022-05-19 NOTE — Assessment & Plan Note (Signed)
Completed 5 days of antibiotics.

## 2022-05-19 NOTE — Assessment & Plan Note (Signed)
Awaiting 10-day isolation ends prior to going out to rehab.

## 2022-05-19 NOTE — Assessment & Plan Note (Signed)
Physical therapy recommending rehab 

## 2022-05-20 DIAGNOSIS — I10 Essential (primary) hypertension: Secondary | ICD-10-CM | POA: Diagnosis not present

## 2022-05-20 DIAGNOSIS — N400 Enlarged prostate without lower urinary tract symptoms: Secondary | ICD-10-CM

## 2022-05-20 DIAGNOSIS — U071 COVID-19: Secondary | ICD-10-CM | POA: Diagnosis not present

## 2022-05-20 DIAGNOSIS — N3000 Acute cystitis without hematuria: Secondary | ICD-10-CM | POA: Diagnosis not present

## 2022-05-20 LAB — BASIC METABOLIC PANEL
Anion gap: 3 — ABNORMAL LOW (ref 5–15)
BUN: 24 mg/dL — ABNORMAL HIGH (ref 8–23)
CO2: 24 mmol/L (ref 22–32)
Calcium: 11 mg/dL — ABNORMAL HIGH (ref 8.9–10.3)
Chloride: 103 mmol/L (ref 98–111)
Creatinine, Ser: 1.37 mg/dL — ABNORMAL HIGH (ref 0.61–1.24)
GFR, Estimated: 49 mL/min — ABNORMAL LOW (ref >60)
Glucose, Bld: 99 mg/dL (ref 70–99)
Potassium: 4.3 mmol/L (ref 3.5–5.1)
Sodium: 130 mmol/L — ABNORMAL LOW (ref 135–145)

## 2022-05-20 LAB — CBC
HCT: 32.9 % — ABNORMAL LOW (ref 39.0–52.0)
Hemoglobin: 10.5 g/dL — ABNORMAL LOW (ref 13.0–17.0)
MCH: 28.8 pg (ref 26.0–34.0)
MCHC: 31.9 g/dL (ref 30.0–36.0)
MCV: 90.4 fL (ref 80.0–100.0)
Platelets: 155 10*3/uL (ref 150–400)
RBC: 3.64 MIL/uL — ABNORMAL LOW (ref 4.22–5.81)
RDW: 12.9 % (ref 11.5–15.5)
WBC: 5.8 10*3/uL (ref 4.0–10.5)
nRBC: 0 % (ref 0.0–0.2)

## 2022-05-20 LAB — TSH: TSH: 1.661 u[IU]/mL (ref 0.350–4.500)

## 2022-05-20 LAB — VITAMIN D 25 HYDROXY (VIT D DEFICIENCY, FRACTURES): Vit D, 25-Hydroxy: 15.37 ng/mL — ABNORMAL LOW (ref 30–100)

## 2022-05-20 MED ORDER — SODIUM CHLORIDE 0.9 % IV SOLN: INTRAVENOUS | Status: DC

## 2022-05-20 NOTE — Assessment & Plan Note (Signed)
Today's calcium up at 11.3 and corrected calcium up at 11.9.  Increase IV fluid hydration and start p.o. Lasix.  Case discussed with pharmacist and will start Miacalcin injections.  TSH normal range.  Vitamin D level is low and I will replace vitamin D..  PTH still pending.  We will send off serum protein electrophoresis and PTH RP history of prostate cancer.  Looking back at outpatient labs and care everywhere and last year also had hypercalcemia.

## 2022-05-20 NOTE — Plan of Care (Signed)
Pt AAOx4, HOH. Reports no pain. VS are WNL. Plan for d/c to [facility] on Tuesday. Bed is in lowest position, call light within reach. Will continue to monitor.

## 2022-05-20 NOTE — Progress Notes (Signed)
Left knee Progress Note   Patient: Jeffrey Dennis GDJ:[id number] DOB: Jan 14, 1930 DOA: 05/11/2022     9 DOS: the patient was seen and examined on 05/20/2022    Assessment and Plan: * Hypercalcemia Today's calcium up at 11.  Start gentle IV fluid hydration.  TSH normal range.  Vitamin D level added onto labs and still pending.  PTH added onto morning labs and still pending.  We will send off serum protein electrophoresis tomorrow morning.  History of prostate cancer.  Looking back at outpatient labs and care everywhere and last year also had hypercalcemia.  Pneumonia due to COVID-19 virus Awaiting 10-day isolation ends prior to going out to rehab.   UTI (urinary tract infection) Completed 5 days of antibiotics.  Hypertension Continue labetalol  BPH (benign prostatic hyperplasia) Continue Flomax  Thrombocytopenia (HCC) Platelet count back in the normal range at 155  Chronic kidney disease, stage 3a (HCC) Creatinine today 1.37 with a GFR of 49  Generalized weakness Physical therapy recommending rehab  Constipation Had bowel movement with MiraLAX  Insomnia On Seroquel and melatonin at night.  Explained that Seroquel does have a black box warning but family thinks it is helpful for him to sleep.  We will continue at this point.        Subjective: Patient feels okay.  Has some arthritis in his knee and right hip.  Awaiting COVID isolation done prior to going out to rehab.  Physical Exam: Vitals:   05/19/22 1518 05/19/22 2138 05/20/22 0443 05/20/22 0822  BP: 132/65 (!) 129/57 (!) 123/58 135/67  Pulse: 69 62 65 78  Resp: '18 18 19 18  '$ Temp:  98 F (36.7 C) 98.2 F (36.8 C) 97.9 F (36.6 C)  TempSrc:  Oral    SpO2: 97% 99% 94% 96%  Weight:      Height:       Physical Exam HENT:     Head: Normocephalic.     Mouth/Throat:     Pharynx: No oropharyngeal exudate.  Eyes:     General: Lids are normal.     Conjunctiva/sclera: Conjunctivae normal.  Cardiovascular:      Rate and Rhythm: Normal rate and regular rhythm.     Heart sounds: Normal heart sounds, S1 normal and S2 normal.  Pulmonary:     Breath sounds: No decreased breath sounds, wheezing, rhonchi or rales.  Abdominal:     Palpations: Abdomen is soft.     Tenderness: There is no abdominal tenderness.  Musculoskeletal:     Right lower leg: Swelling present.     Left lower leg: Swelling present.     Comments: Good range of motion left knee.  Skin:    General: Skin is warm.     Findings: No rash.  Neurological:     Mental Status: He is alert and oriented to person, place, and time.     Data Reviewed: Calcium 11.0 today, creatinine 1.35, sodium 130  Family Communication: Updated patient's daughter on the phone  Disposition: Status is: Inpatient Remains inpatient appropriate because: Awaiting COVID isolation to end.  Working up hypercalcemia.  Starting IV fluids  Planned Discharge Destination: Rehab   Author: Loletha Grayer, MD 05/20/2022 2:43 PM  For on call review [email or web address].

## 2022-05-21 DIAGNOSIS — N3000 Acute cystitis without hematuria: Secondary | ICD-10-CM | POA: Diagnosis not present

## 2022-05-21 DIAGNOSIS — U071 COVID-19: Secondary | ICD-10-CM | POA: Diagnosis not present

## 2022-05-21 DIAGNOSIS — I10 Essential (primary) hypertension: Secondary | ICD-10-CM | POA: Diagnosis not present

## 2022-05-21 DIAGNOSIS — E559 Vitamin D deficiency, unspecified: Secondary | ICD-10-CM

## 2022-05-21 LAB — BASIC METABOLIC PANEL
Anion gap: 3 — ABNORMAL LOW (ref 5–15)
BUN: 28 mg/dL — ABNORMAL HIGH (ref 8–23)
CO2: 25 mmol/L (ref 22–32)
Calcium: 11.3 mg/dL — ABNORMAL HIGH (ref 8.9–10.3)
Chloride: 106 mmol/L (ref 98–111)
Creatinine, Ser: 1.48 mg/dL — ABNORMAL HIGH (ref 0.61–1.24)
GFR, Estimated: 45 mL/min — ABNORMAL LOW (ref >60)
Glucose, Bld: 99 mg/dL (ref 70–99)
Potassium: 4.6 mmol/L (ref 3.5–5.1)
Sodium: 134 mmol/L — ABNORMAL LOW (ref 135–145)

## 2022-05-21 LAB — PARATHYROID HORMONE, INTACT (NO CA): PTH: 71 pg/mL — ABNORMAL HIGH (ref 15–65)

## 2022-05-21 MED ORDER — FUROSEMIDE 20 MG PO TABS
20.0000 mg | ORAL_TABLET | Freq: Every day | ORAL | Status: DC
  Administered 2022-05-21: 20 mg via ORAL
  Filled 2022-05-21: qty 1

## 2022-05-21 MED ORDER — CALCITONIN (SALMON) 200 UNIT/ML IJ SOLN
4.0000 [IU]/kg | Freq: Two times a day (BID) | INTRAMUSCULAR | Status: DC
  Administered 2022-05-21: 396 [IU] via SUBCUTANEOUS
  Filled 2022-05-21 (×3): qty 1.98

## 2022-05-21 MED ORDER — VITAMIN D (ERGOCALCIFEROL) 1.25 MG (50000 UNIT) PO CAPS
50000.0000 [IU] | ORAL_CAPSULE | ORAL | Status: DC
  Administered 2022-05-21: 50000 [IU] via ORAL
  Filled 2022-05-21 (×2): qty 1

## 2022-05-21 MED ORDER — FUROSEMIDE 20 MG PO TABS
20.0000 mg | ORAL_TABLET | Freq: Two times a day (BID) | ORAL | Status: DC
  Administered 2022-05-21: 20 mg via ORAL
  Filled 2022-05-21: qty 1

## 2022-05-21 NOTE — TOC Progression Note (Signed)
Transition of Care [facility]) - Progression Note    Patient Details  Name: Jeffrey Dennis MRN: [id number] Date of Birth: 03-Mar-1930  Transition of Care [facility]) CM/SW [place], LCSW Phone Number: 05/21/2022, 2:53 PM  Clinical Narrative: Confirmed [facility] can still accept patient tomorrow. Started [employer].    Expected Discharge Plan: [place] Barriers to Discharge: Continued Medical Work up  Expected Discharge Plan and Services Expected Discharge Plan: [place] arrangements for the past 2 months: Single Family Home                                       Social Determinants of Health (SDOH) Interventions    Readmission Risk Interventions     No data to display

## 2022-05-21 NOTE — Progress Notes (Signed)
Physical Therapy Treatment Patient Details Name: Jeffrey Dennis MRN: [id number] DOB: [birth year] Today's Date: 05/21/2022   History of Present Illness Pt is a 88 year old male admitted with pneumonia due to COVID-19 virus. PMH significant for HOH, HTN, HAV, bilateral hydronephrosis, urinary incontinence, BPH, CKD stage IIIa, GERD, gout.    PT Comments    Pt resting in bed upon PT arrival; pt's daughter present.  Per pt and pt's daughter, pt was ambulatory with walker until about 8 weeks ago (when pt fell) and pt has been unable to ambulate since and has required increased assist at home.  PCA comes in at 10 AM--to help pt get dressed and ready for day--and at 10 PM--to assist pt with getting ready for bed; 7 days a week; PCA's stay for about 1 hour each time.  Pt also has another home health service (different agency) that comes 2-4 PM M-F to assist with household needs.  During session pt SBA semi-supine to sitting edge of bed; mod assist to stand up to RW from bed; min assist initially to take a couple steps towards recliner with RW but requiring max assist to safely sit down in recliner (pt sitting too early--pt reporting he did not hear therapists cues d/t difficulty hearing and couldn't take anymore steps d/t knee pain).  Pt reporting wanting to be able to improve functional mobility and strength; pt agreeable to SNF for continued therapy upon hospital discharge.  Will continue to focus on strengthening, balance, and progressive functional mobility during hospitalization.    Recommendations for follow up therapy are one component of a multi-disciplinary discharge planning process, led by the attending physician.  Recommendations may be updated based on patient status, additional functional criteria and insurance authorization.  Follow Up Recommendations  Skilled nursing-short term rehab (<3 hours/day)     Assistance Recommended at Discharge Frequent or constant Supervision/Assistance  Patient  can return home with the following A lot of help with walking and/or transfers;A lot of help with bathing/dressing/bathroom;Assist for transportation;Assistance with cooking/housework;Help with stairs or ramp for entrance   Equipment Recommendations  Rolling walker (2 wheels);BSC/3in1;Wheelchair (measurements PT);Wheelchair cushion (measurements PT);Hospital bed    Recommendations for Other Services       Precautions / Restrictions Precautions Precautions: Fall Precaution Comments: covid + Restrictions Weight Bearing Restrictions: No     Mobility  Bed Mobility Overal bed mobility: Needs Assistance Bed Mobility: Supine to Sit     Supine to sit: Supervision, HOB elevated     General bed mobility comments: increased effort/time for pt to perform on own; heavy use of bed rails    Transfers Overall transfer level: Needs assistance Equipment used: Rolling walker (2 wheels) Transfers: Sit to/from Stand Sit to Stand: Mod assist           General transfer comment: mod assist to stand from bed up to RW (pt requesting to hold onto therapists hand--with pt's L hand--to stand); assist to control descent d/t pt sitting too early requiring max assist to safely sit down in recliner    Ambulation/Gait Ambulation/Gait assistance: Min assist, Max assist Gait Distance (Feet):  (2 steps B LE's) Assistive device: Rolling walker (2 wheels)   Gait velocity: decreased     General Gait Details: antalgic; decreased stance time L LE; limited distance d/t knee pain   Stairs             Wheelchair Mobility    Modified Rankin (Stroke Patients Only)       Balance  Overall balance assessment: Needs assistance Sitting-balance support: No upper extremity supported, Feet supported Sitting balance-Leahy Scale: Good Sitting balance - Comments: steady sitting reaching within BOS   Standing balance support: During functional activity, Bilateral upper extremity supported, Reliant on  assistive device for balance Standing balance-Leahy Scale: Fair Standing balance comment: steady static standing with B UE support                            Cognition Arousal/Alertness: Awake/alert Behavior During Therapy: WFL for tasks assessed/performed Overall Cognitive Status: Within Functional Limits for tasks assessed                                 General Comments: Pt very HOH.        Exercises      General Comments  Nursing cleared pt for participation in physical therapy.  Pt agreeable to PT session.      Pertinent Vitals/Pain Pain Assessment Pain Assessment: Faces Faces Pain Scale: Hurts little more Pain Location: L>R knee Pain Descriptors / Indicators: Aching, Discomfort Pain Intervention(s): Limited activity within patient's tolerance, Monitored during session, Repositioned Vitals (HR and O2 on room air) stable and WFL throughout treatment session.    Home Living                          Prior Function            PT Goals (current goals can now be found in the care plan section) Acute Rehab PT Goals Patient Stated Goal: to go home PT Goal Formulation: With patient Time For Goal Achievement: 05/26/22 Potential to Achieve Goals: Fair Progress towards PT goals: Progressing toward goals    Frequency    Min 2X/week      PT Plan Current plan remains appropriate    Co-evaluation              AM-PAC PT "6 Clicks" Mobility   Outcome Measure  Help needed turning from your back to your side while in a flat bed without using bedrails?: A Little Help needed moving from lying on your back to sitting on the side of a flat bed without using bedrails?: A Little Help needed moving to and from a bed to a chair (including a wheelchair)?: A Lot Help needed standing up from a chair using your arms (e.g., wheelchair or bedside chair)?: A Lot Help needed to walk in hospital room?: Total Help needed climbing 3-5 steps  with a railing? : Total 6 Click Score: 12    End of Session Equipment Utilized During Treatment: Gait belt Activity Tolerance: Patient tolerated treatment well Patient left: in chair;with call bell/phone within reach;with chair alarm set;with family/visitor present Nurse Communication: Mobility status;Precautions PT Visit Diagnosis: Muscle weakness (generalized) (M62.81);Difficulty in walking, not elsewhere classified (R26.2);Other abnormalities of gait and mobility (R26.89);Unsteadiness on feet (R26.81)     Time: 1227-1300 PT Time Calculation (min) (ACUTE ONLY): 33 min  Charges:  $Therapeutic Activity: 23-37 mins                    Leitha Bleak, PT 05/21/22, 2:12 PM

## 2022-05-21 NOTE — Progress Notes (Signed)
PT Cancellation Note  Patient Details Name: Jeffrey Dennis MRN: [id number] DOB: 16-Nov-1929   Cancelled Treatment:    Reason Eval/Treat Not Completed: Other (comment).  Chart reviewed.  Pt eating breakfast upon PT arrival (at 1035 AM).  Pt refusing therapy d/t currently eating breakfast and pt reporting it takes him a long time to eat.  Will re-attempt PT session at a later time.  Leitha Bleak, PT 05/21/22, 10:49 AM

## 2022-05-21 NOTE — Assessment & Plan Note (Signed)
Replace oral vitamin D weekly 50,000 units.

## 2022-05-21 NOTE — Plan of Care (Signed)
Pt AAOx4, no pain. VS are stable. Plan for discharge tomorrow to [facility]. Bed is in lowest position, call light within reach. Will continue to monitor.

## 2022-05-21 NOTE — Progress Notes (Signed)
  Progress Note   Patient: Jeffrey Dennis IDP:[id number] DOB: 04-10-1930 DOA: 05/11/2022     10 DOS: the patient was seen and examined on 05/21/2022    Assessment and Plan: * Hypercalcemia Today's calcium up at 11.3 and corrected calcium up at 11.9.  Increase IV fluid hydration and start p.o. Lasix.  Case discussed with pharmacist and will start Miacalcin injections.  TSH normal range.  Vitamin D level is low and I will replace vitamin D..  PTH still pending.  We will send off serum protein electrophoresis and PTH RP history of prostate cancer.  Looking back at outpatient labs and care everywhere and last year also had hypercalcemia.  Pneumonia due to COVID-19 virus Awaiting 10-day isolation ends prior to going out to rehab.   UTI (urinary tract infection) Completed 5 days of antibiotics.  Hypertension Continue labetalol  BPH (benign prostatic hyperplasia) Continue Flomax  Thrombocytopenia (HCC) Platelet count back in the normal range at 155  Chronic kidney disease, stage 3a (HCC) Creatinine today 1.48 with a GFR of 45  Generalized weakness Physical therapy recommending rehab  Vitamin D deficiency Replace oral vitamin D weekly 50,000 units.  Constipation Had bowel movement with MiraLAX  Insomnia On Seroquel and melatonin at night.  Explained that Seroquel does have a black box warning but family thinks it is helpful for him to sleep.  We will continue at this point.        Subjective: Patient feels weak.  Calcium still elevated today.  Physical Exam: Vitals:   05/20/22 0443 05/20/22 0822 05/20/22 1947 05/21/22 0447  BP: (!) 123/58 135/67 127/63 (!) 141/62  Pulse: 65 78 78 68  Resp: '19 18 18 17  '$ Temp: 98.2 F (36.8 C) 97.9 F (36.6 C) 97.9 F (36.6 C) 98.4 F (36.9 C)  TempSrc:      SpO2: 94% 96% 96% 97%  Weight:      Height:       Physical Exam HENT:     Head: Normocephalic.     Mouth/Throat:     Pharynx: No oropharyngeal exudate.  Eyes:     General:  Lids are normal.     Conjunctiva/sclera: Conjunctivae normal.  Cardiovascular:     Rate and Rhythm: Normal rate and regular rhythm.     Heart sounds: Normal heart sounds, S1 normal and S2 normal.  Pulmonary:     Breath sounds: No decreased breath sounds, wheezing, rhonchi or rales.  Abdominal:     Palpations: Abdomen is soft.     Tenderness: There is no abdominal tenderness.  Musculoskeletal:     Right lower leg: Swelling present.     Left lower leg: Swelling present.     Comments: Good range of motion left knee.  Skin:    General: Skin is warm.     Findings: No rash.  Neurological:     Mental Status: He is alert and oriented to person, place, and time.     Data Reviewed: Calcium 11.3, corrected calcium 11.9, sodium 134, vitamin D 15.37  Family Communication: Spoke with daughter at the bedside  Disposition: Status is: Inpatient Remains inpatient appropriate because: Undergoing treatment for hypercalcemia currently.  Awaiting PTH.  Planned Discharge Destination: Rehab    Time spent: 28 minutes  Author: Loletha Grayer, MD 05/21/2022 2:18 PM  For on call review [email or web address].

## 2022-05-22 DIAGNOSIS — E210 Primary hyperparathyroidism: Secondary | ICD-10-CM

## 2022-05-22 LAB — BASIC METABOLIC PANEL
Anion gap: 4 — ABNORMAL LOW (ref 5–15)
BUN: 21 mg/dL (ref 8–23)
CO2: 23 mmol/L (ref 22–32)
Calcium: 10.4 mg/dL — ABNORMAL HIGH (ref 8.9–10.3)
Chloride: 108 mmol/L (ref 98–111)
Creatinine, Ser: 1.18 mg/dL (ref 0.61–1.24)
GFR, Estimated: 59 mL/min — ABNORMAL LOW (ref >60)
Glucose, Bld: 104 mg/dL — ABNORMAL HIGH (ref 70–99)
Potassium: 4.1 mmol/L (ref 3.5–5.1)
Sodium: 135 mmol/L (ref 135–145)

## 2022-05-22 MED ORDER — ARTIFICIAL TEARS OPHTHALMIC OINT: TOPICAL_OINTMENT | OPHTHALMIC | 0 refills | Status: DC | PRN

## 2022-05-22 MED ORDER — FLUTICASONE PROPIONATE 50 MCG/ACT NA SUSP: 2.0000 | Freq: Every day | NASAL | 2 refills | Status: DC

## 2022-05-22 MED ORDER — POLYETHYLENE GLYCOL 3350 17 G PO PACK: 17.0000 g | PACK | Freq: Every day | ORAL | 0 refills | Status: DC

## 2022-05-22 MED ORDER — VITAMIN D (ERGOCALCIFEROL) 1.25 MG (50000 UNIT) PO CAPS: 50000.0000 [IU] | ORAL_CAPSULE | ORAL | 0 refills | Status: DC

## 2022-05-22 MED ORDER — LABETALOL HCL 100 MG PO TABS: 100.0000 mg | ORAL_TABLET | Freq: Every day | ORAL | Status: DC

## 2022-05-22 MED ORDER — QUETIAPINE FUMARATE 25 MG PO TABS: 25.0000 mg | ORAL_TABLET | Freq: Every day | ORAL | 0 refills | Status: DC

## 2022-05-22 MED ORDER — LORATADINE 10 MG PO TABS: 10.0000 mg | ORAL_TABLET | Freq: Every day | ORAL | 0 refills | Status: DC

## 2022-05-22 MED ORDER — HYDROCODONE-ACETAMINOPHEN 5-325 MG PO TABS: 1.0000 | ORAL_TABLET | Freq: Three times a day (TID) | ORAL | 0 refills | Status: DC | PRN

## 2022-05-22 MED ORDER — CINACALCET HCL 30 MG PO TABS
30.0000 mg | ORAL_TABLET | Freq: Two times a day (BID) | ORAL | Status: DC
  Administered 2022-05-22: 30 mg via ORAL
  Filled 2022-05-22 (×2): qty 1

## 2022-05-22 MED ORDER — ACETAMINOPHEN 325 MG PO TABS: 650.0000 mg | ORAL_TABLET | Freq: Four times a day (QID) | ORAL | Status: DC | PRN

## 2022-05-22 MED ORDER — MELATONIN 5 MG PO TABS: 5.0000 mg | ORAL_TABLET | Freq: Every day | ORAL | 0 refills | Status: DC

## 2022-05-22 MED ORDER — CINACALCET HCL 30 MG PO TABS: 30.0000 mg | ORAL_TABLET | Freq: Two times a day (BID) | ORAL | 0 refills | Status: DC

## 2022-05-22 NOTE — Progress Notes (Signed)
Patient Discharge    Patient discharged to [facility], room 503. Report called to nurse there. IV removed without difficulty, with no redness or swelling noted at the site. Discharge instructions reviewed with daughter. Daughter verbalized understanding of the instructions.

## 2022-05-22 NOTE — TOC Transition Note (Signed)
Transition of Care [facility]) - CM/SW Discharge Note   Patient Details  Name: BROGHAN PANNONE MRN: [id number] Date of Birth: [birth year]  [facility] CM/SW Contact:  Candie Chroman, LCSW Phone Number: 05/22/2022, 1:08 PM   Clinical Narrative:   Patient has orders to discharge to [facility] today. RN will call report to [phone] (Room 503). EMS transport has been arranged and he is 4th on the list. No further concerns. CSW signing off.  Final next level of care: Skilled Nursing Facility Barriers to Discharge: Barriers Resolved   Patient Goals and CMS Choice        Discharge Placement PASRR number recieved: 05/14/22            Patient chooses bed at: [facility] Patient to be transferred to facility by: EMS Name of family member notified: Jacqlyn Larsen Purser Patient and family notified of of transfer: 05/22/22  Discharge Plan and Services                                     Social Determinants of Health (SDOH) Interventions     Readmission Risk Interventions     No data to display

## 2022-05-22 NOTE — Care Management Important Message (Signed)
Important Message  Patient Details  Name: Jeffrey Dennis MRN: [id number] Date of Birth: 09-Jul-1930   Medicare Important Message Given:  Yes     Dannette Barbara 05/22/2022, 12:25 PM

## 2022-05-22 NOTE — Discharge Summary (Signed)
Physician Discharge Summary   Patient: Jeffrey Dennis MRN: [id number] DOB: 1930-01-12  Admit date:     05/11/2022  Discharge date: 05/22/22  Discharge Physician: Loletha Grayer   PCP: [name], MD   Recommendations at discharge:   Follow-up team at rehab 1 day Recommend checking a BMP with calcium in 1 to 2 weeks  Discharge Diagnoses: Principal Problem:   Hypercalcemia Active Problems:   Pneumonia due to COVID-19 virus   UTI (urinary tract infection)   Hypertension   BPH (benign prostatic hyperplasia)   Thrombocytopenia (HCC)   Chronic kidney disease, stage 3a (HCC)   Generalized weakness   Insomnia   Constipation   Vitamin D deficiency  Primary hyperparathyroidism Hyponatremia  Hospital Course: 88 year old male with past medical history of hearing impairment, hypertension, CKD stage IIIa, osteoarthritis, BPH s/p TURP overflow incontinence, gout, bilateral hydronephrosis presented to the ED on 05/11/2022 for evaluation of progressive generalized weakness cough, congestion, dry cough, fever/chills and shortness of breath for several days.  Also reports having loose stools yesterday.  He tested positive for COVID at home, as did his daughter.  The patient was started on prednisone, molnupiravir and Augmentin by PCP 2 days prior to presenting.  His weakness progressed to the point patient unable to ambulate.  In the ED, confirmed positive for COVID-19 by PCR.  UA was also consistent with infection.  Chest x-ray showed mild left lower lobe infiltration consistent with pneumonia.  Patient was admitted to the hospital, started on empiric Rocephin for UTI pending cultures and Paxlovid for COVID 19 infection.  He has had stable O2 sats on room air.  7/5: Remains stable.  Has to wait 10 days before going to SNF.  Patient developed hypercalcemia during the hospital course.  The patient was given IV fluid hydration.  Vitamin D level was low and vitamin D supplementation was given.   Patient was given IV fluids and Lasix.  The patient was given Miacalcin injections which then caused the patient to have some chills.  This medication was discontinued.  PTH intact was 71 which goes along with primary hyperparathyroidism.  Sensipar prescribed.  Recommend checking a BMP with a calcium in 1 week.  Assessment and Plan: * Hypercalcemia With PTH elevated at 71 this is likely primary hyperparathyroidism.  Today's calcium up at 10.4.  Calcium peaked at 11.3. TSH normal range.  Vitamin D level is low a at 15.37, and I will replace vitamin D.  Miacalcin injections caused the patient to have chills and this was discontinued.  We will start Sensipar 30 mg twice a day to lower calcium.  Follow-up with PMD as outpatient once out of rehab.  Pneumonia due to COVID-19 virus Stable to go out to rehab.   UTI (urinary tract infection) Completed 5 days of antibiotics.  Hypertension Continue labetalol  BPH (benign prostatic hyperplasia) Continue Flomax  Thrombocytopenia (HCC) Platelet count back in the normal range at 155  Chronic kidney disease, stage 3a (HCC) Creatinine today 1.18 with IV fluid hydration with a GFR 59  Generalized weakness Physical therapy recommending rehab  Vitamin D deficiency Replace oral vitamin D weekly 50,000 units.  Constipation Had bowel movement with MiraLAX  Insomnia On Seroquel and melatonin at night.  Explained that Seroquel does have a black box warning but family thinks it is helpful for him to sleep.  We will continue at this point.  Hyponatremia during the hospital course.  Sodium 135 normal range upon discharge  Consultants: None Procedures performed: None Disposition: Rehabilitation facility Diet recommendation:  Cardiac diet DISCHARGE MEDICATION: Allergies as of 05/22/2022       Reactions   Doxazosin Other (See Comments)   Telmisartan Other (See Comments)        Medication List     TAKE these medications     acetaminophen 325 MG tablet Commonly known as: TYLENOL Take 2 tablets (650 mg total) by mouth every 6 (six) hours as needed for mild pain or fever.   artificial tears Oint ophthalmic ointment Commonly known as: LACRILUBE Place into both eyes every 4 (four) hours as needed for dry eyes.   cinacalcet 30 MG tablet Commonly known as: SENSIPAR Take 1 tablet (30 mg total) by mouth 2 (two) times daily with a meal.   fluticasone 50 MCG/ACT nasal spray Commonly known as: FLONASE Place 2 sprays into both nostrils daily. Start taking on: May 23, 2022   HYDROcodone-acetaminophen 5-325 MG tablet Commonly known as: NORCO/VICODIN Take 1 tablet by mouth every 8 (eight) hours as needed for severe pain.   labetalol 100 MG tablet Commonly known as: NORMODYNE Take 1 tablet (100 mg total) by mouth daily in the afternoon.   loratadine 10 MG tablet Commonly known as: CLARITIN Take 1 tablet (10 mg total) by mouth daily. Start taking on: May 23, 2022   melatonin 5 MG Tabs Take 1 tablet (5 mg total) by mouth at bedtime.   polyethylene glycol 17 g packet Commonly known as: MIRALAX / GLYCOLAX Take 17 g by mouth daily. Start taking on: May 23, 2022   QUEtiapine 25 MG tablet Commonly known as: SEROQUEL Take 1 tablet (25 mg total) by mouth at bedtime.   tamsulosin 0.4 MG Caps capsule Commonly known as: FLOMAX Take 0.4 mg by mouth 2 (two) times daily.   Vitamin D (Ergocalciferol) 1.25 MG (50000 UNIT) Caps capsule Commonly known as: DRISDOL Take 1 capsule (50,000 Units total) by mouth every 7 (seven) days. Every monday Start taking on: May 28, 2022        Contact information for after-discharge care     [facility] Preferred SNF .   Service: Skilled Nursing Contact information: [place] [phone]                    Discharge  Exam: Danley Danker Weights   05/10/22 2132  Weight: 99 kg   Physical Exam HENT:     Head: Normocephalic.     Mouth/Throat:     Pharynx: No oropharyngeal exudate.  Eyes:     General: Lids are normal.     Conjunctiva/sclera: Conjunctivae normal.  Cardiovascular:     Rate and Rhythm: Normal rate and regular rhythm.     Heart sounds: Normal heart sounds, S1 normal and S2 normal.  Pulmonary:     Breath sounds: No decreased breath sounds, wheezing, rhonchi or rales.  Abdominal:     Palpations: Abdomen is soft.     Tenderness: There is no abdominal tenderness.  Musculoskeletal:     Right lower leg: Swelling present.     Left lower leg: Swelling present.     Comments: Good range of motion left knee.  Skin:    General: Skin is warm.     Findings: No rash.  Neurological:     Mental Status: He is alert and oriented to person, place, and time.  Condition at discharge: stable  The results of significant diagnostics from this hospitalization (including imaging, microbiology, ancillary and laboratory) are listed below for reference.   Imaging Studies: DG Chest 1 View  Result Date: 05/10/2022 CLINICAL DATA:  Positive COVID.  Cough EXAM: CHEST  1 VIEW COMPARISON:  None Available. FINDINGS: Linear densities in the lingula at the left base, likely atelectasis. Right lung clear. Heart is normal size. No effusions or acute bony abnormality. IMPRESSION: Lingular/left basilar atelectasis. Electronically Signed   By: Rolm Baptise M.D.   On: 05/10/2022 22:12    Microbiology: Results for orders placed or performed during the hospital encounter of 05/11/22  SARS Coronavirus 2 by RT PCR (hospital order, performed in [facility] hospital lab) *cepheid single result test* Anterior Nasal Swab     Status: Abnormal   Collection Time: 05/10/22  9:35 PM   Specimen: Anterior Nasal Swab  Result Value Ref Range Status   SARS Coronavirus 2 by RT PCR POSITIVE (A) NEGATIVE Final    Comment: (NOTE) SARS-CoV-2  target nucleic acids are DETECTED  SARS-CoV-2 RNA is generally detectable in upper respiratory specimens  during the acute phase of infection.  Positive results are indicative  of the presence of the identified virus, but do not rule out bacterial infection or co-infection with other pathogens not detected by the test.  Clinical correlation with patient history and  other diagnostic information is necessary to determine patient infection status.  The expected result is negative.  Fact Sheet for Patients:   https://www.patel.info/   Fact Sheet for Healthcare Providers:   https://hall.com/    This test is not yet approved or cleared by the [place] FDA and  has been authorized for detection and/or diagnosis of SARS-CoV-2 by FDA under an Emergency Use Authorization (EUA).  This EUA will remain in effect (meaning this test can be used) for the duration of  the COVID-19 declaration under Section 564(b)(1)  of the Act, 21 U.S.C. section 360-bbb-3(b)(1), unless the authorization is terminated or revoked sooner.   Performed at [facility], [address] [place], [place] [address]   Urine Culture     Status: Abnormal   Collection Time: 05/11/22  8:02 AM   Specimen: Urine, Clean Catch  Result Value Ref Range Status   Specimen Description   Final    URINE, CLEAN CATCH Performed at [facility], [address] [place], [place] [address]    Special Requests   Final    NONE Performed at [facility], [place], [place] [address]    Culture >=100,000 COLONIES/mL ESCHERICHIA COLI (A)  Final   Report Status 05/13/2022 FINAL  Final   Organism ID, Bacteria ESCHERICHIA COLI (A)  Final      Susceptibility   Escherichia coli - MIC*    AMPICILLIN >=32 RESISTANT Resistant     CEFAZOLIN 16 SENSITIVE Sensitive     CEFEPIME <=0.12 SENSITIVE Sensitive     CEFTRIAXONE <=0.25 SENSITIVE Sensitive      CIPROFLOXACIN >=4 RESISTANT Resistant     GENTAMICIN >=16 RESISTANT Resistant     IMIPENEM <=0.25 SENSITIVE Sensitive     NITROFURANTOIN <=16 SENSITIVE Sensitive     TRIMETH/SULFA <=20 SENSITIVE Sensitive     AMPICILLIN/SULBACTAM >=32 RESISTANT Resistant     PIP/TAZO 64 INTERMEDIATE Intermediate     * >=100,000 COLONIES/mL ESCHERICHIA COLI    Labs: CBC: Recent Labs  Lab 05/20/22 0415  WBC 5.8  HGB 10.5*  HCT 32.9*  MCV  90.4  PLT 466   Basic Metabolic Panel: Recent Labs  Lab 05/20/22 0415 05/21/22 0352 05/22/22 0725  NA 130* 134* 135  K 4.3 4.6 4.1  CL 103 106 108  CO2 '24 25 23  '$ GLUCOSE 99 99 104*  BUN 24* 28* 21  CREATININE 1.37* 1.48* 1.18  CALCIUM 11.0* 11.3* 10.4*     Discharge time spent: greater than 30 minutes.  Signed: Loletha Grayer, MD [facility] 05/22/2022

## 2022-05-22 NOTE — TOC Progression Note (Addendum)
Transition of Care [facility]) - Progression Note    Patient Details  Name: Jeffrey Dennis MRN: [id number] Date of Birth: [birth year]  Transition of Care [facility]) CM/SW [place], LCSW Phone Number: 05/22/2022, 8:19 AM  Clinical Narrative:   Insurance authorization still pending.  11:21 am: Auth approved: [id number]. Valid 7/11-7/13. Sent secure chat to MD and RN to notify.  Expected Discharge Plan: [place] Barriers to Discharge: Continued Medical Work up  Expected Discharge Plan and Services Expected Discharge Plan: [place] arrangements for the past 2 months: Single Family Home                                       Social Determinants of Health (SDOH) Interventions    Readmission Risk Interventions     No data to display

## 2022-05-24 LAB — PROTEIN ELECTROPHORESIS, SERUM
A/G Ratio: 1.2 (ref 0.7–1.7)
Albumin ELP: 2.7 g/dL — ABNORMAL LOW (ref 2.9–4.4)
Alpha-1-Globulin: 0.2 g/dL (ref 0.0–0.4)
Alpha-2-Globulin: 0.6 g/dL (ref 0.4–1.0)
Beta Globulin: 0.7 g/dL (ref 0.7–1.3)
Gamma Globulin: 0.8 g/dL (ref 0.4–1.8)
Globulin, Total: 2.3 g/dL (ref 2.2–3.9)
Total Protein ELP: 5 g/dL — ABNORMAL LOW (ref 6.0–8.5)

## 2022-05-25 LAB — PTH-RELATED PEPTIDE: PTH-related peptide: <2 pmol/L

## 2022-12-10 ENCOUNTER — Other Ambulatory Visit: Payer: Self-pay

## 2022-12-10 ENCOUNTER — Emergency Department: Payer: Medicare Other

## 2022-12-10 ENCOUNTER — Emergency Department
Admission: EM | Admit: 2022-12-10 | Discharge: 2022-12-10 | Disposition: A | Discharge status: Home / Self Care | Payer: Medicare Other | Attending: Emergency Medicine | Admitting: Emergency Medicine

## 2022-12-10 ENCOUNTER — Encounter: Payer: Self-pay | Admitting: Emergency Medicine

## 2022-12-10 DIAGNOSIS — M25511 Pain in right shoulder: Secondary | ICD-10-CM | POA: Diagnosis present

## 2022-12-10 DIAGNOSIS — M62838 Other muscle spasm: Secondary | ICD-10-CM

## 2022-12-10 DIAGNOSIS — M546 Pain in thoracic spine: Secondary | ICD-10-CM | POA: Diagnosis not present

## 2022-12-10 DIAGNOSIS — W19XXXA Unspecified fall, initial encounter: Secondary | ICD-10-CM

## 2022-12-10 MED ORDER — ACETAMINOPHEN 500 MG PO TABS
1000.0000 mg | ORAL_TABLET | Freq: Once | ORAL | Status: AC
  Administered 2022-12-10: 1000 mg via ORAL
  Filled 2022-12-10: qty 2

## 2022-12-10 NOTE — ED Triage Notes (Signed)
Presents from [facility] s/p fall last Thursday  Hitting head

## 2022-12-10 NOTE — Discharge Instructions (Addendum)
You were seen in the emergency department for right hip pain and right upper back pain.  Your x-rays did not show any broken bones.  Concern that you have arthritis and a muscle spasm.  You can alternate Motrin and Tylenol for pain control.  You can apply ice and heat to the area and alternate.  You can try over-the-counter Lidoderm patches 4% and you can get them at [facility], remove after 12 hours.  You can try an electric neck and back massager which you can get at [place] or on [employer].  This might help with your muscle spasms.  Pain control:  Ibuprofen (motrin/aleve/advil) - You can take 3-4 tablets (600-800 mg) every 6 hours as needed for pain/fever.  Acetaminophen (tylenol) - You can take 2 extra strength tablets (1000 mg) every 6 hours as needed for pain/fever.  You can alternate these medications or take them together.  Make sure you eat food/drink water when taking these medications.

## 2022-12-10 NOTE — ED Provider Notes (Signed)
[facility] Provider Note    Event Date/Time   First MD Initiated Contact with Patient 12/10/22 1330     (approximate)   History   Fall   HPI  Jeffrey Dennis is a 89 y.o. male with past medical history significant for arthritis who presents to the emergency department with right shoulder pain and hip pain.  States that he has had a fall over the past couple of days.  Wanted to get checked out today to make sure that he did not have any broken bones.  States that he has been taking Tylenol for pain control.  History of taking a muscle relaxer.  Denies any numbness or weakness.  No chest pain or shortness of breath.     Physical Exam   Triage Vital Signs: ED Triage Vitals  Enc Vitals Group     BP 12/10/22 1238 121/60     Pulse Rate 12/10/22 1238 68     Resp 12/10/22 1238 18     Temp 12/10/22 1238 98.6 F (37 C)     Temp Source 12/10/22 1238 Oral     SpO2 12/10/22 1238 96 %     Weight 12/10/22 1223 218 lb 4.1 oz (99 kg)     Height 12/10/22 1223 '5\' 11"'$  (1.803 m)     Head Circumference --      Peak Flow --      Pain Score --      Pain Loc --      Pain Edu? --      Excl. in [place]? --     Most recent vital signs: Vitals:   12/10/22 1238  BP: 121/60  Pulse: 68  Resp: 18  Temp: 98.6 F (37 C)  SpO2: 96%    Physical Exam Constitutional:      Appearance: He is well-developed.  HENT:     Head: Atraumatic.  Eyes:     Conjunctiva/sclera: Conjunctivae normal.  Cardiovascular:     Rate and Rhythm: Regular rhythm.  Pulmonary:     Effort: No respiratory distress.  Musculoskeletal:       Arms:     Cervical back: Normal range of motion.  Skin:    General: Skin is warm.  Neurological:     Mental Status: He is alert. Mental status is at baseline.      IMPRESSION / MDM / ASSESSMENT AND PLAN / ED COURSE  I reviewed the triage vital signs and the nursing notes.  Differential diagnosis including arthritis, muscle strain, fracture,  dislocation.  Clinical picture is not consistent with ACS.  No chest pain and pain is only present with moving his right upper extremity following the fall.  Clinical picture is not consistent with dissection or pulmonary embolism.  Do not feel that further lab work is necessary at this time.  Fall occurred last week, normal mentation.   RADIOLOGY I independently reviewed imaging, my interpretation of imaging: X-ray imaging with no acute fracture or dislocation   Labs (all labs ordered are listed, but only abnormal results are displayed) Labs interpreted as -    Labs Reviewed - No data to display    Patient was given Tylenol for pain control.  Discussed symptomatic treatment with muscle relaxers, NSAIDs, Tylenol, heat and cold.  Discussed Lidoderm patches.  Discussed close follow-up with primary care physician and given return precautions for any worsening symptoms.  Discussed the results with family member over the telephone.   PROCEDURES:  Critical Care performed:  No  Procedures  Patient's presentation is most consistent with acute presentation with potential threat to life or bodily function.   MEDICATIONS ORDERED IN ED: Medications  acetaminophen (TYLENOL) tablet 1,000 mg (1,000 mg Oral Given 12/10/22 1416)    FINAL CLINICAL IMPRESSION(S) / ED DIAGNOSES   Final diagnoses:  Fall, initial encounter  Acute right-sided thoracic back pain  Muscle spasm     Rx / DC Orders   ED Discharge Orders     None        Note:  This document was prepared using Dragon voice recognition software and may include unintentional dictation errors.   Nathaniel Man, MD 12/10/22 [phone]

## 2023-02-25 ENCOUNTER — Ambulatory Visit: Payer: Medicare Other | Admitting: Urology

## 2023-02-26 ENCOUNTER — Ambulatory Visit: Payer: Medicare Other | Admitting: Urology

## 2023-02-26 ENCOUNTER — Other Ambulatory Visit: Payer: Self-pay

## 2023-02-26 ENCOUNTER — Emergency Department: Payer: Medicare Other

## 2023-02-26 ENCOUNTER — Inpatient Hospital Stay
Admission: EM | Admit: 2023-02-26 | Discharge: 2023-03-07 | DRG: 690 | Disposition: A | Discharge status: Skilled Nursing Facility | Payer: Medicare Other | Attending: Internal Medicine | Admitting: Internal Medicine

## 2023-02-26 ENCOUNTER — Encounter: Payer: Self-pay | Admitting: Family Medicine

## 2023-02-26 DIAGNOSIS — I129 Hypertensive chronic kidney disease with stage 1 through stage 4 chronic kidney disease, or unspecified chronic kidney disease: Secondary | ICD-10-CM | POA: Diagnosis present

## 2023-02-26 DIAGNOSIS — E872 Acidosis, unspecified: Secondary | ICD-10-CM | POA: Diagnosis present

## 2023-02-26 DIAGNOSIS — N183 Chronic kidney disease, stage 3 unspecified: Secondary | ICD-10-CM

## 2023-02-26 DIAGNOSIS — N3000 Acute cystitis without hematuria: Secondary | ICD-10-CM

## 2023-02-26 DIAGNOSIS — F0394 Unspecified dementia, unspecified severity, with anxiety: Secondary | ICD-10-CM | POA: Diagnosis present

## 2023-02-26 DIAGNOSIS — E210 Primary hyperparathyroidism: Secondary | ICD-10-CM | POA: Diagnosis present

## 2023-02-26 DIAGNOSIS — N1832 Chronic kidney disease, stage 3b: Secondary | ICD-10-CM | POA: Diagnosis present

## 2023-02-26 DIAGNOSIS — N390 Urinary tract infection, site not specified: Secondary | ICD-10-CM | POA: Diagnosis present

## 2023-02-26 DIAGNOSIS — R338 Other retention of urine: Secondary | ICD-10-CM | POA: Diagnosis present

## 2023-02-26 DIAGNOSIS — H9190 Unspecified hearing loss, unspecified ear: Secondary | ICD-10-CM | POA: Diagnosis present

## 2023-02-26 DIAGNOSIS — N1831 Chronic kidney disease, stage 3a: Secondary | ICD-10-CM | POA: Diagnosis not present

## 2023-02-26 DIAGNOSIS — Z66 Do not resuscitate: Secondary | ICD-10-CM | POA: Diagnosis present

## 2023-02-26 DIAGNOSIS — Y828 Other medical devices associated with adverse incidents: Secondary | ICD-10-CM | POA: Diagnosis present

## 2023-02-26 DIAGNOSIS — Z515 Encounter for palliative care: Secondary | ICD-10-CM

## 2023-02-26 DIAGNOSIS — G8929 Other chronic pain: Secondary | ICD-10-CM | POA: Diagnosis present

## 2023-02-26 DIAGNOSIS — Z9079 Acquired absence of other genital organ(s): Secondary | ICD-10-CM

## 2023-02-26 DIAGNOSIS — Z823 Family history of stroke: Secondary | ICD-10-CM | POA: Diagnosis not present

## 2023-02-26 DIAGNOSIS — M1990 Unspecified osteoarthritis, unspecified site: Secondary | ICD-10-CM | POA: Diagnosis present

## 2023-02-26 DIAGNOSIS — R3129 Other microscopic hematuria: Secondary | ICD-10-CM | POA: Diagnosis present

## 2023-02-26 DIAGNOSIS — I10 Essential (primary) hypertension: Secondary | ICD-10-CM | POA: Diagnosis present

## 2023-02-26 DIAGNOSIS — K5909 Other constipation: Secondary | ICD-10-CM | POA: Diagnosis present

## 2023-02-26 DIAGNOSIS — R54 Age-related physical debility: Secondary | ICD-10-CM | POA: Diagnosis present

## 2023-02-26 DIAGNOSIS — Z79899 Other long term (current) drug therapy: Secondary | ICD-10-CM

## 2023-02-26 DIAGNOSIS — N39490 Overflow incontinence: Secondary | ICD-10-CM | POA: Diagnosis present

## 2023-02-26 DIAGNOSIS — N401 Enlarged prostate with lower urinary tract symptoms: Secondary | ICD-10-CM | POA: Diagnosis present

## 2023-02-26 DIAGNOSIS — F4381 Prolonged grief disorder: Secondary | ICD-10-CM | POA: Diagnosis present

## 2023-02-26 DIAGNOSIS — E876 Hypokalemia: Secondary | ICD-10-CM | POA: Diagnosis present

## 2023-02-26 DIAGNOSIS — Z7189 Other specified counseling: Secondary | ICD-10-CM | POA: Diagnosis not present

## 2023-02-26 DIAGNOSIS — R339 Retention of urine, unspecified: Secondary | ICD-10-CM | POA: Diagnosis not present

## 2023-02-26 DIAGNOSIS — N400 Enlarged prostate without lower urinary tract symptoms: Secondary | ICD-10-CM | POA: Diagnosis not present

## 2023-02-26 DIAGNOSIS — Z8249 Family history of ischemic heart disease and other diseases of the circulatory system: Secondary | ICD-10-CM | POA: Diagnosis not present

## 2023-02-26 DIAGNOSIS — T83511A Infection and inflammatory reaction due to indwelling urethral catheter, initial encounter: Principal | ICD-10-CM | POA: Diagnosis present

## 2023-02-26 DIAGNOSIS — B9620 Unspecified Escherichia coli [E. coli] as the cause of diseases classified elsewhere: Secondary | ICD-10-CM | POA: Diagnosis present

## 2023-02-26 DIAGNOSIS — Z888 Allergy status to other drugs, medicaments and biological substances status: Secondary | ICD-10-CM

## 2023-02-26 DIAGNOSIS — R531 Weakness: Secondary | ICD-10-CM | POA: Diagnosis not present

## 2023-02-26 DIAGNOSIS — K219 Gastro-esophageal reflux disease without esophagitis: Secondary | ICD-10-CM | POA: Diagnosis present

## 2023-02-26 LAB — CBC
HCT: 42.1 % (ref 39.0–52.0)
Hemoglobin: 14.1 g/dL (ref 13.0–17.0)
MCH: 29.7 pg (ref 26.0–34.0)
MCHC: 33.5 g/dL (ref 30.0–36.0)
MCV: 88.8 fL (ref 80.0–100.0)
Platelets: 207 10*3/uL (ref 150–400)
RBC: 4.74 MIL/uL (ref 4.22–5.81)
RDW: 13 % (ref 11.5–15.5)
WBC: 12.5 10*3/uL — ABNORMAL HIGH (ref 4.0–10.5)
nRBC: 0 % (ref 0.0–0.2)

## 2023-02-26 LAB — BASIC METABOLIC PANEL
Anion gap: 10 (ref 5–15)
Anion gap: 12 (ref 5–15)
BUN: 29 mg/dL — ABNORMAL HIGH (ref 8–23)
BUN: 26 mg/dL — ABNORMAL HIGH (ref 8–23)
CO2: 25 mmol/L (ref 22–32)
CO2: 20 mmol/L — ABNORMAL LOW (ref 22–32)
Calcium: 11.4 mg/dL — ABNORMAL HIGH (ref 8.9–10.3)
Calcium: 10.9 mg/dL — ABNORMAL HIGH (ref 8.9–10.3)
Chloride: 96 mmol/L — ABNORMAL LOW (ref 98–111)
Chloride: 99 mmol/L (ref 98–111)
Creatinine, Ser: 1.51 mg/dL — ABNORMAL HIGH (ref 0.61–1.24)
Creatinine, Ser: 1.41 mg/dL — ABNORMAL HIGH (ref 0.61–1.24)
GFR, Estimated: 44 mL/min — ABNORMAL LOW (ref >60)
GFR, Estimated: 47 mL/min — ABNORMAL LOW (ref >60)
Glucose, Bld: 134 mg/dL — ABNORMAL HIGH (ref 70–99)
Glucose, Bld: 102 mg/dL — ABNORMAL HIGH (ref 70–99)
Potassium: 2.7 mmol/L — CL (ref 3.5–5.1)
Potassium: 3.7 mmol/L (ref 3.5–5.1)
Sodium: 131 mmol/L — ABNORMAL LOW (ref 135–145)
Sodium: 131 mmol/L — ABNORMAL LOW (ref 135–145)

## 2023-02-26 LAB — URINALYSIS, ROUTINE W REFLEX MICROSCOPIC
Bilirubin Urine: NEGATIVE
Glucose, UA: NEGATIVE mg/dL
Ketones, ur: NEGATIVE mg/dL
Nitrite: NEGATIVE
Protein, ur: 30 mg/dL — AB
Specific Gravity, Urine: 1.009 (ref 1.005–1.030)
WBC, UA: >50 WBC/hpf (ref 0–5)
pH: 5 (ref 5.0–8.0)

## 2023-02-26 LAB — MAGNESIUM: Magnesium: 1.5 mg/dL — ABNORMAL LOW (ref 1.7–2.4)

## 2023-02-26 LAB — LACTIC ACID, PLASMA
Lactic Acid, Venous: 2.2 mmol/L (ref 0.5–1.9)
Lactic Acid, Venous: 1.8 mmol/L (ref 0.5–1.9)

## 2023-02-26 MED ORDER — SODIUM CHLORIDE 0.9 % IV SOLN
1.0000 g | Freq: Once | INTRAVENOUS | Status: AC
  Administered 2023-02-26: 1 g via INTRAVENOUS
  Filled 2023-02-26: qty 10

## 2023-02-26 MED ORDER — MELATONIN 5 MG PO TABS
5.0000 mg | ORAL_TABLET | Freq: Once | ORAL | Status: AC
  Administered 2023-02-26: 5 mg via ORAL
  Filled 2023-02-26: qty 1

## 2023-02-26 MED ORDER — LACTATED RINGERS IV BOLUS
1000.0000 mL | Freq: Once | INTRAVENOUS | Status: AC
  Administered 2023-02-26: 1000 mL via INTRAVENOUS

## 2023-02-26 MED ORDER — POLYETHYLENE GLYCOL 3350 17 G PO PACK
17.0000 g | PACK | Freq: Once | ORAL | Status: AC
  Administered 2023-02-26: 17 g via ORAL
  Filled 2023-02-26: qty 1

## 2023-02-26 MED ORDER — POTASSIUM CHLORIDE 2 MEQ/ML IV SOLN
INTRAVENOUS | Status: DC
  Filled 2023-02-26 (×8): qty 1000

## 2023-02-26 MED ORDER — CHLORHEXIDINE GLUCONATE CLOTH 2 % EX PADS
6.0000 | MEDICATED_PAD | Freq: Every day | CUTANEOUS | Status: DC
  Administered 2023-02-26 – 2023-03-07 (×7): 6 via TOPICAL

## 2023-02-26 MED ORDER — ONDANSETRON HCL 4 MG PO TABS
4.0000 mg | ORAL_TABLET | Freq: Four times a day (QID) | ORAL | Status: DC | PRN
  Administered 2023-02-28 – 2023-03-06 (×2): 4 mg via ORAL
  Filled 2023-02-26 (×2): qty 1

## 2023-02-26 MED ORDER — ONDANSETRON HCL 4 MG/2ML IJ SOLN
4.0000 mg | Freq: Four times a day (QID) | INTRAMUSCULAR | Status: DC | PRN
  Administered 2023-02-27: 4 mg via INTRAVENOUS
  Filled 2023-02-26 (×2): qty 2

## 2023-02-26 MED ORDER — ACETAMINOPHEN 325 MG PO TABS
650.0000 mg | ORAL_TABLET | Freq: Four times a day (QID) | ORAL | Status: DC | PRN
  Administered 2023-02-26 – 2023-03-06 (×9): 650 mg via ORAL
  Filled 2023-02-26 (×10): qty 2

## 2023-02-26 MED ORDER — POTASSIUM CHLORIDE CRYS ER 20 MEQ PO TBCR
40.0000 meq | EXTENDED_RELEASE_TABLET | Freq: Once | ORAL | Status: AC
  Administered 2023-02-26: 40 meq via ORAL
  Filled 2023-02-26: qty 2

## 2023-02-26 MED ORDER — CINACALCET HCL 30 MG PO TABS
30.0000 mg | ORAL_TABLET | Freq: Two times a day (BID) | ORAL | Status: DC
  Administered 2023-02-26 – 2023-03-07 (×18): 30 mg via ORAL
  Filled 2023-02-26 (×20): qty 1

## 2023-02-26 MED ORDER — ENOXAPARIN SODIUM 40 MG/0.4ML IJ SOSY
40.0000 mg | PREFILLED_SYRINGE | INTRAMUSCULAR | Status: DC
  Administered 2023-02-26 – 2023-03-06 (×9): 40 mg via SUBCUTANEOUS
  Filled 2023-02-26 (×9): qty 0.4

## 2023-02-26 NOTE — ED Triage Notes (Signed)
Pt to ED with family for generalized weakness for past few days. Family reports pt has been staying in recliner and unable to get up on own. Has foley catheter and infection. Has been on antibiotic. Reports pain in both legs.

## 2023-02-26 NOTE — Assessment & Plan Note (Signed)
Baseline Cr 1.2-1.5  Cr 1.5 today  Appears dry  IVF hydration  Follow

## 2023-02-26 NOTE — H&P (Signed)
History and Physical    Patient: Jeffrey Dennis HYQ:[id number] DOB: 06-09-1930 DOA: 02/26/2023 DOS: the patient was seen and examined on 02/26/2023 PCP: Danella Penton, MD  Patient coming from: Home  Chief Complaint:  Chief Complaint  Patient presents with   Weakness   HPI: Kaileb D Drudge is a 89 y.o. male with medical history significant of hearing impairment, hypertension, CKD stage IIIa, osteoarthritis, BPH s/p TURP overflow incontinence, gout presenting w/ UTI, urinary retention.  Patient has been evaluated roughly 1 week ago at outside emergency room for acute urinary retention.  Catheter placed.  Was due for urology follow-up within this week.  Since catheter placement, patient with worsening weakness.  Has had significant cloudy urine output.  Was placed on course of cefdinir and then transition to Levaquin for urinary treatment.  Still with lower abdominal pain weakness and malaise.  No reported bloody urine output from the Foley.  Has extensive history of BPH status post TURP.  Prior urinary cultures growing E. coli sensitive to Rocephin.  No reported chest pain or shortness of breath.  No reported nausea or vomiting.  Positive decreased p.o. intake. Presented to the ER afebrile, hemodynamically stable.  Satting well on room air.  White count 12.5, hemoglobin 14.1, urinalysis indicative of infection, lactate 2.2, creatinine 1.51, potassium 2.7.  Started on IV Rocephin in the ER. Review of Systems: As mentioned in the history of present illness. All other systems reviewed and are negative. Past Medical History:  Diagnosis Date   Arthritis    Bilateral hydronephrosis    GERD (gastroesophageal reflux disease)    Hearing decreased    History of hepatitis A    Hypertension    Incontinence of urine    Past Surgical History:  Procedure Laterality Date   CYSTOSCOPY W/ RETROGRADES  10/21/2012   Procedure: CYSTOSCOPY WITH RETROGRADE PYELOGRAM;  Surgeon: Anner Crete, MD;  Location: [facility];   Service: Urology;  Laterality: Bilateral;  cystoscopy bilateral retrograde  ureteral dilatation     NOSE SURGERY  1959   PROSTATE SURGERY     PROSTATECTOMY  2004   TONSILLECTOMY     TRANSURETHRAL RESECTION OF PROSTATE  10/21/2012   Procedure: TRANSURETHRAL RESECTION OF THE PROSTATE (TURP);  Surgeon: Anner Crete, MD;  Location: [facility];  Service: Urology;  Laterality: N/A;   Social History:  reports that he has never smoked. He has never used smokeless tobacco. He reports that he does not drink alcohol and does not use drugs.  Allergies  Allergen Reactions   Doxazosin Other (See Comments)   Telmisartan Other (See Comments)    Family History  Problem Relation Age of Onset   Stroke Father    Heart attack Father    Stroke Mother     Prior to Admission medications   Medication Sig Start Date End Date Taking? Authorizing Provider  acetaminophen (TYLENOL) 325 MG tablet Take 2 tablets (650 mg total) by mouth every 6 (six) hours as needed for mild pain or fever. 05/22/22   Alford Highland, MD  artificial tears (LACRILUBE) OINT ophthalmic ointment Place into both eyes every 4 (four) hours as needed for dry eyes. 05/22/22   Alford Highland, MD  cinacalcet (SENSIPAR) 30 MG tablet Take 1 tablet (30 mg total) by mouth 2 (two) times daily with a meal. 05/22/22   Renae Gloss, Richard, MD  fluticasone (FLONASE) 50 MCG/ACT nasal spray Place 2 sprays into both nostrils daily. 05/23/22   Alford Highland, MD  HYDROcodone-acetaminophen (NORCO/VICODIN) [phone]  MG tablet Take 1 tablet by mouth every 8 (eight) hours as needed for severe pain. 05/22/22   Alford Highland, MD  labetalol (NORMODYNE) 100 MG tablet Take 1 tablet (100 mg total) by mouth daily in the afternoon. 05/22/22   Alford Highland, MD  loratadine (CLARITIN) 10 MG tablet Take 1 tablet (10 mg total) by mouth daily. 05/23/22   Alford Highland, MD  melatonin 5 MG TABS Take 1 tablet (5 mg total) by mouth at bedtime. 05/22/22   Wieting, Richard, MD   polyethylene glycol (MIRALAX / GLYCOLAX) 17 g packet Take 17 g by mouth daily. 05/23/22   Alford Highland, MD  QUEtiapine (SEROQUEL) 25 MG tablet Take 1 tablet (25 mg total) by mouth at bedtime. 05/22/22   Alford Highland, MD  tamsulosin (FLOMAX) 0.4 MG CAPS capsule Take 0.4 mg by mouth 2 (two) times daily. 01/27/20   [provider]  Vitamin D, Ergocalciferol, (DRISDOL) 1.25 MG (50000 UNIT) CAPS capsule Take 1 capsule (50,000 Units total) by mouth every 7 (seven) days. Every monday 05/28/22   Alford Highland, MD    Physical Exam: Vitals:   02/26/23 0915 02/26/23 0917  BP:  131/68  Pulse: 75   Resp: 20   Temp: (!) 97.5 F (36.4 C)   TempSrc: Oral   SpO2: 96%   Weight:  95.3 kg   Physical Exam Constitutional:      General: He is not in acute distress.    Appearance: He is normal weight.  HENT:     Head: Normocephalic and atraumatic.     Nose: Nose normal.     Mouth/Throat:     Mouth: Mucous membranes are moist.  Eyes:     Pupils: Pupils are equal, round, and reactive to light.  Cardiovascular:     Rate and Rhythm: Normal rate and regular rhythm.  Pulmonary:     Effort: Pulmonary effort is normal.  Abdominal:     General: Bowel sounds are normal.  Musculoskeletal:        General: Normal range of motion.  Skin:    General: Skin is warm.  Neurological:     General: No focal deficit present.  Psychiatric:        Mood and Affect: Mood normal.     Data Reviewed:  There are no new results to review at this time. DG Chest Port 1 View CLINICAL DATA:  Possible sepsis  EXAM: PORTABLE CHEST 1 VIEW  COMPARISON:  05/10/2022  FINDINGS: Cardiac shadow is stable. Lungs are well aerated bilaterally. No focal infiltrate or sizable effusion is seen. No bony abnormality is noted.  IMPRESSION: No active disease.  Electronically Signed   By: Alcide Clever M.D.   On: 02/26/2023 10:28  Lab Results  Component Value Date   WBC 12.5 (H) 02/26/2023   HGB 14.1  02/26/2023   HCT 42.1 02/26/2023   MCV 88.8 02/26/2023   PLT 207 02/26/2023   Last metabolic panel Lab Results  Component Value Date   GLUCOSE 134 (H) 02/26/2023   NA 131 (L) 02/26/2023   K 2.7 (LL) 02/26/2023   CL 96 (L) 02/26/2023   CO2 25 02/26/2023   BUN 29 (H) 02/26/2023   CREATININE 1.51 (H) 02/26/2023   GFRNONAA 44 (L) 02/26/2023   CALCIUM 11.4 (H) 02/26/2023   PHOS 2.8 10/24/2012   PROT 5.5 (L) 05/14/2022   ALBUMIN 3.2 (L) 05/14/2022   LABGLOB 2.3 05/21/2022   AGRATIO 1.2 05/21/2022   BILITOT 0.9 05/14/2022   ALKPHOS 74  05/14/2022   AST 17 05/14/2022   ALT 25 05/14/2022   ANIONGAP 10 02/26/2023    Assessment and Plan: * UTI (urinary tract infection) Acute UTI in setting of urinary retention foley in place w/ baseline hx/o BPH s/p TURP overflow incontinence  Failed outpt course of cefdinir and levaquin  Prior urine cultures w/ e coli sensitive to rocephin  IV rocephin  Repeat urine culture Exchange foley    BPH (benign prostatic hyperplasia) Acute urinary retention with foley in place and UTI  Cont flomax  Was due for urology follow up today  Exchange foley  Hypertension BP stable  Titrate home regimen    Chronic kidney disease, stage 3a Baseline Cr 1.2-1.5  Cr 1.5 today  Appears dry  IVF hydration  Follow    Hypokalemia K 2.7  Replete  Check mag level    Primary hyperparathyroidism Ca 11.4  Cont sensipar        Advance Care Planning:   Code Status: DNR   Consults: None   Family Communication: Daughter at the bedside   Severity of Illness: The appropriate patient status for this patient is INPATIENT. Inpatient status is judged to be reasonable and necessary in order to provide the required intensity of service to ensure the patient's safety. The patient's presenting symptoms, physical exam findings, and initial radiographic and laboratory data in the context of their chronic comorbidities is felt to place them at high risk for  further clinical deterioration. Furthermore, it is not anticipated that the patient will be medically stable for discharge from the hospital within 2 midnights of admission.   * I certify that at the point of admission it is my clinical judgment that the patient will require inpatient hospital care spanning beyond 2 midnights from the point of admission due to high intensity of service, high risk for further deterioration and high frequency of surveillance required.*  Author: Floydene Flock, MD 02/26/2023 12:49 PM  For on call review [email or web address].

## 2023-02-26 NOTE — Assessment & Plan Note (Signed)
Acute urinary retention with foley in place and UTI  Cont flomax  Was due for urology follow up today  Exchange foley

## 2023-02-26 NOTE — Assessment & Plan Note (Signed)
K 2.7  Replete  Check mag level

## 2023-02-26 NOTE — Assessment & Plan Note (Signed)
Ca 11.4  Cont sensipar

## 2023-02-26 NOTE — ED Provider Notes (Signed)
[facility] Provider Note    Event Date/Time   First MD Initiated Contact with Patient 02/26/23 0912     (approximate)   History   Weakness   HPI  Jeffrey Dennis is a 89 y.o. male  whop resents to the emergency department today because of concern for weakness.  The patient was seen in the emergency department at outside hospital roughly 1 week ago for urinary retention.  Had a catheter placed at that time.  He states that since having the catheter placed he has really been moving around much.  He has been sitting in his chair.  They had an appointment with the urologist today to try removing the catheter however he was so weak that it was very difficult for his friend to get him into the car.  Patient denies any fevers.  Denies any pain.      Physical Exam   Triage Vital Signs: ED Triage Vitals  Enc Vitals Group     BP 02/26/23 0917 131/68     Pulse Rate 02/26/23 0915 75     Resp 02/26/23 0915 20     Temp 02/26/23 0915 (!) 97.5 F (36.4 C)     Temp Source 02/26/23 0915 Oral     SpO2 02/26/23 0915 96 %     Weight 02/26/23 0917 210 lb (95.3 kg)     Height --      Head Circumference --      Peak Flow --      Pain Score 02/26/23 0917 7     Pain Loc --      Pain Edu? --      Excl. in [place]? --     Most recent vital signs: Vitals:   02/26/23 0915 02/26/23 0917  BP:  131/68  Pulse: 75   Resp: 20   Temp: (!) 97.5 F (36.4 C)   SpO2: 96%    General: Awake, alert, oriented. CV:  Good peripheral perfusion. Regular rate and rhythm. Resp:  Normal effort. Lungs clear. Abd:  No distention. Non tender. Other:  Foley catheter in place.    ED Results / Procedures / Treatments   Labs (all labs ordered are listed, but only abnormal results are displayed) Labs Reviewed  CBC - Abnormal; Notable for the following components:      Result Value   WBC 12.5 (*)    All other components within normal limits  BASIC METABOLIC PANEL - Abnormal; Notable for  the following components:   Sodium 131 (*)    Potassium 2.7 (*)    Chloride 96 (*)    Glucose, Bld 134 (*)    BUN 29 (*)    Creatinine, Ser 1.51 (*)    Calcium 11.4 (*)    GFR, Estimated 44 (*)    All other components within normal limits  URINALYSIS, ROUTINE W REFLEX MICROSCOPIC - Abnormal; Notable for the following components:   Color, Urine YELLOW (*)    APPearance HAZY (*)    Hgb urine dipstick SMALL (*)    Protein, ur 30 (*)    Leukocytes,Ua LARGE (*)    Bacteria, UA RARE (*)    All other components within normal limits  LACTIC ACID, PLASMA - Abnormal; Notable for the following components:   Lactic Acid, Venous 2.2 (*)    All other components within normal limits  CULTURE, BLOOD (ROUTINE X 2)  CULTURE, BLOOD (ROUTINE X 2)  LACTIC ACID, PLASMA     EKG  Lurline Idol, attending physician, personally viewed and interpreted this EKG  EKG Time: 0922 Rate: 73 Rhythm: sinus rhythm Axis: right axis deviation Intervals: qtc 473 QRS: RBBB,LPFB ST changes: no st elevation Impression: abnormal ekg    RADIOLOGY I independently interpreted and visualized the CXR. My interpretation: No pneumonia Radiology interpretation:  IMPRESSION:  No active disease.     PROCEDURES:  Critical Care performed: No   MEDICATIONS ORDERED IN ED: Medications - No data to display   IMPRESSION / MDM / ASSESSMENT AND PLAN / ED COURSE  I reviewed the triage vital signs and the nursing notes.                              Differential diagnosis includes, but is not limited to, UTI, pneumonia, anemia, electrolyte abnromality.  Patient's presentation is most consistent with acute presentation with potential threat to life or bodily function.   The patient is on the cardiac monitor to evaluate for evidence of arrhythmia and/or significant heart rate changes.  Patient presented to the emergency department today because of concerns for profound weakness.  Patient has had a Foley  catheter in for the past week.  On exam patient is awake and alert.  Patient is afebrile.  Workup was notable for elevated white blood cell count and lactic acidosis.  Urine is consistent with urinary tract infection.  Given weakness I do think patient would benefit from IV antibiotics and fluids.  Discussed findings with patient and family.  Discussed with Dr. Alvester Morin with the hospitalist service who will plan on admission.      FINAL CLINICAL IMPRESSION(S) / ED DIAGNOSES   Final diagnoses:  Lower urinary tract infectious disease  Weakness    Note:  This document was prepared using Dragon voice recognition software and may include unintentional dictation errors.    Phineas Semen, MD 02/26/23 1302

## 2023-02-26 NOTE — Assessment & Plan Note (Signed)
Acute UTI in setting of urinary retention foley in place w/ baseline hx/o BPH s/p TURP overflow incontinence  Failed outpt course of cefdinir and levaquin  Prior urine cultures w/ e coli sensitive to rocephin  IV rocephin  Repeat urine culture Exchange foley

## 2023-02-26 NOTE — Assessment & Plan Note (Signed)
BP stable Titrate home regimen 

## 2023-02-27 ENCOUNTER — Inpatient Hospital Stay: Payer: Medicare Other

## 2023-02-27 DIAGNOSIS — N3000 Acute cystitis without hematuria: Secondary | ICD-10-CM | POA: Diagnosis not present

## 2023-02-27 LAB — URINALYSIS, COMPLETE (UACMP) WITH MICROSCOPIC
Bilirubin Urine: NEGATIVE
Glucose, UA: NEGATIVE mg/dL
Ketones, ur: NEGATIVE mg/dL
Nitrite: NEGATIVE
Protein, ur: 100 mg/dL — AB
Specific Gravity, Urine: 1.009 (ref 1.005–1.030)
WBC, UA: >50 WBC/hpf (ref 0–5)
pH: 7 (ref 5.0–8.0)

## 2023-02-27 LAB — COMPREHENSIVE METABOLIC PANEL
ALT: 18 U/L (ref 0–44)
AST: 20 U/L (ref 15–41)
Albumin: 3.1 g/dL — ABNORMAL LOW (ref 3.5–5.0)
Alkaline Phosphatase: 71 U/L (ref 38–126)
Anion gap: 7 (ref 5–15)
BUN: 27 mg/dL — ABNORMAL HIGH (ref 8–23)
CO2: 26 mmol/L (ref 22–32)
Calcium: 11.1 mg/dL — ABNORMAL HIGH (ref 8.9–10.3)
Chloride: 103 mmol/L (ref 98–111)
Creatinine, Ser: 1.42 mg/dL — ABNORMAL HIGH (ref 0.61–1.24)
GFR, Estimated: 47 mL/min — ABNORMAL LOW (ref >60)
Glucose, Bld: 107 mg/dL — ABNORMAL HIGH (ref 70–99)
Potassium: 3.2 mmol/L — ABNORMAL LOW (ref 3.5–5.1)
Sodium: 136 mmol/L (ref 135–145)
Total Bilirubin: 0.8 mg/dL (ref 0.3–1.2)
Total Protein: 5.8 g/dL — ABNORMAL LOW (ref 6.5–8.1)

## 2023-02-27 LAB — PROCALCITONIN: Procalcitonin: 0.11 ng/mL

## 2023-02-27 MED ORDER — SENNOSIDES-DOCUSATE SODIUM 8.6-50 MG PO TABS
1.0000 | ORAL_TABLET | Freq: Two times a day (BID) | ORAL | Status: DC
  Administered 2023-02-27 – 2023-03-04 (×11): 1 via ORAL
  Filled 2023-02-27 (×11): qty 1

## 2023-02-27 MED ORDER — FINASTERIDE 5 MG PO TABS
5.0000 mg | ORAL_TABLET | Freq: Every day | ORAL | Status: DC
  Administered 2023-02-27 – 2023-03-07 (×9): 5 mg via ORAL
  Filled 2023-02-27 (×9): qty 1

## 2023-02-27 MED ORDER — TAMSULOSIN HCL 0.4 MG PO CAPS
0.4000 mg | ORAL_CAPSULE | Freq: Two times a day (BID) | ORAL | Status: DC
  Administered 2023-02-27 – 2023-03-07 (×17): 0.4 mg via ORAL
  Filled 2023-02-27 (×18): qty 1

## 2023-02-27 MED ORDER — SORBITOL 70 % SOLN
960.0000 mL | TOPICAL_OIL | Freq: Once | ORAL | Status: AC
  Administered 2023-02-27: 960 mL via RECTAL
  Filled 2023-02-27: qty 240

## 2023-02-27 MED ORDER — LABETALOL HCL 100 MG PO TABS
100.0000 mg | ORAL_TABLET | Freq: Every day | ORAL | Status: DC
  Administered 2023-02-27 – 2023-03-06 (×7): 100 mg via ORAL
  Filled 2023-02-27 (×9): qty 1

## 2023-02-27 MED ORDER — ARTIFICIAL TEARS OPHTHALMIC OINT: TOPICAL_OINTMENT | OPHTHALMIC | Status: DC | PRN

## 2023-02-27 MED ORDER — OXYBUTYNIN CHLORIDE ER 10 MG PO TB24
10.0000 mg | ORAL_TABLET | Freq: Every day | ORAL | Status: DC
  Administered 2023-02-27 – 2023-03-06 (×8): 10 mg via ORAL
  Filled 2023-02-27 (×8): qty 1

## 2023-02-27 MED ORDER — FLUTICASONE PROPIONATE 50 MCG/ACT NA SUSP: 2.0000 | Freq: Every day | NASAL | Status: DC | PRN

## 2023-02-27 MED ORDER — POLYETHYLENE GLYCOL 3350 17 G PO PACK
17.0000 g | PACK | Freq: Every day | ORAL | Status: DC
  Administered 2023-02-27 – 2023-03-04 (×6): 17 g via ORAL
  Filled 2023-02-27 (×6): qty 1

## 2023-02-27 MED ORDER — BISACODYL 10 MG RE SUPP
10.0000 mg | Freq: Every day | RECTAL | Status: DC | PRN
  Administered 2023-02-27: 10 mg via RECTAL
  Filled 2023-02-27: qty 1

## 2023-02-27 MED ORDER — SODIUM CHLORIDE 0.9 % IV SOLN
1.0000 g | INTRAVENOUS | Status: AC
  Administered 2023-02-27 – 2023-03-02 (×4): 1 g via INTRAVENOUS
  Filled 2023-02-27: qty 1
  Filled 2023-02-27: qty 10
  Filled 2023-02-27 (×2): qty 1

## 2023-02-27 MED ORDER — LORATADINE 10 MG PO TABS
10.0000 mg | ORAL_TABLET | Freq: Every day | ORAL | Status: DC
  Administered 2023-02-27 – 2023-03-07 (×9): 10 mg via ORAL
  Filled 2023-02-27 (×9): qty 1

## 2023-02-27 MED ORDER — POTASSIUM CHLORIDE CRYS ER 20 MEQ PO TBCR
40.0000 meq | EXTENDED_RELEASE_TABLET | ORAL | Status: AC
  Administered 2023-02-27 (×2): 40 meq via ORAL
  Filled 2023-02-27 (×2): qty 2

## 2023-02-27 MED ORDER — PANTOPRAZOLE SODIUM 40 MG PO TBEC
40.0000 mg | DELAYED_RELEASE_TABLET | Freq: Every day | ORAL | Status: DC
  Administered 2023-02-27 – 2023-03-07 (×9): 40 mg via ORAL
  Filled 2023-02-27 (×9): qty 1

## 2023-02-27 NOTE — Progress Notes (Signed)
PROGRESS NOTE    Jeffrey Dennis  ZOX:[id number] DOB: 02-28-1930 DOA: 02/26/2023 PCP: Danella Penton, MD    Brief Narrative:   89 y.o. male with medical history significant of hearing impairment, hypertension, CKD stage IIIa, osteoarthritis, BPH s/p TURP overflow incontinence, gout presenting w/ UTI, urinary retention.  Patient has been evaluated roughly 1 week ago at outside emergency room for acute urinary retention.  Catheter placed.  Was due for urology follow-up within this week.  Since catheter placement, patient with worsening weakness.  Has had significant cloudy urine output.  Was placed on course of cefdinir and then transition to Levaquin for urinary treatment.  Still with lower abdominal pain weakness and malaise.  No reported bloody urine output from the Foley.  Has extensive history of BPH status post TURP.  Prior urinary cultures growing E. coli sensitive to Rocephin.  No reported chest pain or shortness of breath.  No reported nausea or vomiting.  Positive decreased p.o. intake. Presented to the ER afebrile, hemodynamically stable.  Satting well on room air.  White count 12.5, hemoglobin 14.1, urinalysis indicative of infection, lactate 2.2, creatinine 1.51, potassium 2.7.  Started on IV Rocephin in the ER.   Assessment & Plan:   Principal Problem:   UTI (urinary tract infection) Active Problems:   BPH (benign prostatic hyperplasia)   Hypertension   Chronic kidney disease, stage 3a   Primary hyperparathyroidism   Hypokalemia  * UTI (urinary tract infection) Acute UTI in setting of urinary retention foley in place w/ baseline hx/o BPH s/p TURP overflow incontinence  Failed outpt course of cefdinir and levaquin  Prior urine cultures w/ e coli sensitive to rocephin  Unfortunately urine culture was not sent on repeat urinalysis Foley catheter exchanged in ED Plan: Continue IV Rocephin Repeat urine culture though may be impacted by exposure antibiotics Continue Foley  catheter Will plan to discharge with Foley catheter in place and outpatient urology follow-up for voiding trial    BPH (benign prostatic hyperplasia) Acute urinary retention with foley in place and UTI  Cont flomax  Foley exchanged in ED Continue Foley, discharged with Foley in place   Hypertension BP stable  Titrate home regimen      Chronic kidney disease, stage 3a Baseline Cr 1.2-1.5  Urine appears dark Continue IVF for today  Hypokalemia K 2.7  Replete  Check mag level      Primary hyperparathyroidism Ca 11.4  Cont sensipar   Constipation Patient endorses 8 to 9 days since last BM KUB with moderate stool burden, no signs of ileus or obstruction Initiate bowel regimen   DVT prophylaxis: SQ lovenox Code Status: DNR Family Communication: Daughter at bedside 4/17 Disposition Plan: Status is: Inpatient Remains inpatient appropriate because: UTI on IV antibiotics   Level of care: Telemetry Medical  Consultants:  None  Procedures:  None  Antimicrobials: Ceftriaxone   Subjective: Seen and examined.  Resting in bed.  History limited by hearing loss.  No acute distress.  Objective: Vitals:   02/27/23 0002 02/27/23 0504 02/27/23 0734 02/27/23 1125  BP: (!) 111/55 109/80 (!) 115/47 110/70  Pulse: 78 95 97 79  Resp: Temp: 98.4 F (36.9 C) 97.7 F (36.5 C) 97.6 F (36.4 C) (!) 97.5 F (36.4 C)  TempSrc: Oral   Oral  SpO2: 99% 94% 94% 96%  Weight:        Intake/Output Summary (Last 24 hours) at 02/27/2023 1245 Last data filed at 02/27/2023 1112 Gross per  24 hour  Intake 1145.21 ml  Output 2550 ml  Net -1404.79 ml   Filed Weights   02/26/23 0917  Weight: 95.3 kg    Examination:  General exam: NAD.  Here loss Respiratory system: Clear to auscultation. Respiratory effort normal. Cardiovascular system: S1-S2, RRR, no murmurs, no pedal edema Gastrointestinal system: Soft, mild TTP suprapubic region, nondistended, normal bowel  sounds Central nervous system: Alert and oriented. No focal neurological deficits. Extremities: Symmetric 5 x 5 power. Skin: No rashes, lesions or ulcers Psychiatry: Judgement and insight appear normal. Mood & affect appropriate.     Data Reviewed: I have personally reviewed following labs and imaging studies  CBC: Recent Labs  Lab 02/26/23 0919  WBC 12.5*  HGB 14.1  HCT 42.1  MCV 88.8  PLT 207   Basic Metabolic Panel: Recent Labs  Lab 02/26/23 0919 02/26/23 0924 02/26/23 1935 02/27/23 0422  NA 131*  --  131* 136  K 2.7*  --  3.7 3.2*  CL 96*  --  99 103  CO2 25  --  20* 26  GLUCOSE 134*  --  102* 107*  BUN 29*  --  26* 27*  CREATININE 1.51*  --  1.41* 1.42*  CALCIUM 11.4*  --  10.9* 11.1*  MG  --  1.5*  --   --    GFR: Estimated Creatinine Clearance: 40.7 mL/min (A) (by C-G formula based on SCr of 1.42 mg/dL (H)). Liver Function Tests: Recent Labs  Lab 02/27/23 0422  AST 20  ALT 18  ALKPHOS 71  BILITOT 0.8  PROT 5.8*  ALBUMIN 3.1*   No results for input(s): "LIPASE", "AMYLASE" in the last 168 hours. No results for input(s): "AMMONIA" in the last 168 hours. Coagulation Profile: No results for input(s): "INR", "PROTIME" in the last 168 hours. Cardiac Enzymes: No results for input(s): "CKTOTAL", "CKMB", "CKMBINDEX", "TROPONINI" in the last 168 hours. BNP (last 3 results) No results for input(s): "PROBNP" in the last 8760 hours. HbA1C: No results for input(s): "HGBA1C" in the last 72 hours. CBG: No results for input(s): "GLUCAP" in the last 168 hours. Lipid Profile: No results for input(s): "CHOL", "HDL", "LDLCALC", "TRIG", "CHOLHDL", "LDLDIRECT" in the last 72 hours. Thyroid Function Tests: No results for input(s): "TSH", "T4TOTAL", "FREET4", "T3FREE", "THYROIDAB" in the last 72 hours. Anemia Panel: No results for input(s): "VITAMINB12", "FOLATE", "FERRITIN", "TIBC", "IRON", "RETICCTPCT" in the last 72 hours. Sepsis Labs: Recent Labs  Lab  02/26/23 0924 02/26/23 1308  LATICACIDVEN 2.2* 1.8    Recent Results (from the past 240 hour(s))  Blood Culture (routine x 2)     Status: None (Preliminary result)   Collection Time: 02/26/23  9:24 AM   Specimen: BLOOD  Result Value Ref Range Status   Specimen Description BLOOD LEFT ARM  Final   Special Requests BOTTLES DRAWN AEROBIC AND ANAEROBIC BCHV  Final   Culture   Final    NO GROWTH < 24 HOURS Performed at [facility], [address], [place], [place] [address]    Report Status PENDING  Incomplete  Blood Culture (routine x 2)     Status: None (Preliminary result)   Collection Time: 02/26/23  9:24 AM   Specimen: BLOOD  Result Value Ref Range Status   Specimen Description BLOOD RIGHT ARM  Final   Special Requests   Final    BOTTLES DRAWN AEROBIC AND ANAEROBIC Blood Culture adequate volume   Culture   Final    NO GROWTH < 24 HOURS Performed  at [facility], [address], [place], [place] [address]    Report Status PENDING  Incomplete         Radiology Studies: DG Abd 1 View  Result Date: 02/27/2023 CLINICAL DATA:  Constipation EXAM: ABDOMEN - 1 VIEW COMPARISON:  CT 02/13/2020 FINDINGS: Nonobstructive bowel gas pattern. Moderate colonic stool burden most prominent in the ascending and transverse colon. No radiopaque calculi overlie the kidneys. Degenerative changes of the spine and hips. IMPRESSION: Moderate colonic stool burden most prominent in the ascending and transverse colon. No evidence of bowel obstruction. Electronically Signed   By: Caprice Renshaw M.D.   On: 02/27/2023 10:19   DG Chest Port 1 View  Result Date: 02/26/2023 CLINICAL DATA:  Possible sepsis EXAM: PORTABLE CHEST 1 VIEW COMPARISON:  05/10/2022 FINDINGS: Cardiac shadow is stable. Lungs are well aerated bilaterally. No focal infiltrate or sizable effusion is seen. No bony abnormality is noted. IMPRESSION: No active disease. Electronically Signed   By: Alcide Clever M.D.   On:  02/26/2023 10:28        Scheduled Meds:  Chlorhexidine Gluconate Cloth  6 each Topical Q0600   cinacalcet  30 mg Oral BID WC   enoxaparin (LOVENOX) injection  40 mg Subcutaneous Q24H   finasteride  5 mg Oral Daily   labetalol  100 mg Oral Q1500   loratadine  10 mg Oral Daily   oxybutynin  10 mg Oral QHS   pantoprazole  40 mg Oral Daily   polyethylene glycol  17 g Oral Daily   potassium chloride  40 mEq Oral Q4H   senna-docusate  1 tablet Oral BID   tamsulosin  0.4 mg Oral BID   Continuous Infusions:  cefTRIAXone (ROCEPHIN)  IV 1 g (02/27/23 1035)   lactated ringers 1,000 mL with potassium chloride 20 mEq infusion 100 mL/hr at 02/27/23 1112     LOS: 1 day     Tresa Moore, MD [facility]   If 7PM-7AM, please contact night-coverage  02/27/2023, 12:45 PM

## 2023-02-27 NOTE — Progress Notes (Signed)
Pt is demanding to get to [facility]. Pt is unable to support his weight sitting up and leans back. Pt also states he is unable to bear weight on his leg. Pt is upset. Pt want to be picked up and placed on BSC. I explained to pt it was against our policy to deadlift pts. [place]oyar lift is not an option to [facility] because pt can not support self enough to be able to remove lift pad for [facility] usage and replace upon completion. Pt States he will get someone to come and get him up. I explained to pt I would advise anyone to come that picking pt up is not advisable and places the individual and the patient at risk. Pt asked to see MD. MD, S. Sreenath notified.

## 2023-02-28 ENCOUNTER — Ambulatory Visit: Payer: Medicare Other | Admitting: Urology

## 2023-02-28 ENCOUNTER — Ambulatory Visit: Payer: Medicare Other | Admitting: Physician Assistant

## 2023-02-28 DIAGNOSIS — R339 Retention of urine, unspecified: Secondary | ICD-10-CM

## 2023-02-28 DIAGNOSIS — N390 Urinary tract infection, site not specified: Secondary | ICD-10-CM

## 2023-02-28 DIAGNOSIS — N401 Enlarged prostate with lower urinary tract symptoms: Secondary | ICD-10-CM

## 2023-02-28 DIAGNOSIS — N3000 Acute cystitis without hematuria: Secondary | ICD-10-CM | POA: Diagnosis not present

## 2023-02-28 LAB — BASIC METABOLIC PANEL
Anion gap: 6 (ref 5–15)
BUN: 21 mg/dL (ref 8–23)
CO2: 23 mmol/L (ref 22–32)
Calcium: 10.7 mg/dL — ABNORMAL HIGH (ref 8.9–10.3)
Chloride: 106 mmol/L (ref 98–111)
Creatinine, Ser: 1.46 mg/dL — ABNORMAL HIGH (ref 0.61–1.24)
GFR, Estimated: 45 mL/min — ABNORMAL LOW (ref >60)
Glucose, Bld: 112 mg/dL — ABNORMAL HIGH (ref 70–99)
Potassium: 4 mmol/L (ref 3.5–5.1)
Sodium: 135 mmol/L (ref 135–145)

## 2023-02-28 LAB — URINE CULTURE: Culture: NO GROWTH

## 2023-02-28 LAB — CULTURE, BLOOD (ROUTINE X 2)
Culture: NO GROWTH
Special Requests: ADEQUATE

## 2023-02-28 NOTE — Consult Note (Signed)
Urology Consult  I have been asked to see the patient by Dr. Georgeann Oppenheim, for evaluation and management of urinary retention.  Chief Complaint: Weakness  History of Present Illness: Jeffrey Dennis is a 89 y.o. year old male admitted on 02/26/2023 with complicated UTI.  He has a long and complex urologic history notable for elevated PSA with negative biopsy around 2003, urethral stricture s/p dilation with Dr. Lonna Cobb in 2011, BPH with prostatomegaly causing urinary retention and bilateral hydronephrosis s/p TURP with Dr. Annabell Howells in 2013, and hematuria with concern for bladder/ureteral tumor that was found to be inflammatory/infectious in nature s/p cystoscopy with Dr. Achilles Dunk in 2018. He takes Flomax daily.  He was seen at [facility] ED on 02/18/2023 with reports of several days of difficulty voiding.  Foley catheter was placed when bladder scan showed >35mL.  His UA was notable for pyuria and microscopic hematuria, no nitrites.  Urine culture grew mixed gram-positive and negative organisms.  He was treated with cefdinir 300 mg twice daily x 7 days for possible UTI.  He presented to our emergency department 2 days ago with reports of generalized weakness and BLE pain.  He had originally been scheduled for outpatient voiding trial with Dr. Richardo Hanks that day, but was unable to get in and out of the car due to his weakness.  Notably, he reported decreased ambulation following Foley catheter placement.  He was admitted after labs revealed leukocytosis and lactic acidosis and started on fluid resuscitation and IV antibiotics as below.  Foley catheter was exchanged on admission.  He has been afebrile, VSS. Admission blood cultures pending with no growth at <24 hours; urine culture from yesterday is pending. Yesterday's labs notable for stable creatinine 1.42; UA with 11-20 RBCs/hpf, >50 WBCs/hpf, and rare bacteria; and procalcitonin 0.11.  Today he reports he is feeling better since admission, but not  necessarily stronger. He denies bladder or flank pain. Foley catheter in place draining clear, yellow urine. He prefers to follow up with [place] at our [place] location.  Anti-infectives (From admission, onward)    Start     Dose/Rate Route Frequency Ordered Stop   02/27/23 1100  cefTRIAXone (ROCEPHIN) 1 g in sodium chloride 0.9 % 100 mL IVPB        1 g 200 mL/hr over 30 Minutes Intravenous Every 24 hours 02/27/23 0751     02/26/23 1100  cefTRIAXone (ROCEPHIN) 1 g in sodium chloride 0.9 % 100 mL IVPB        1 g 200 mL/hr over 30 Minutes Intravenous  Once 02/26/23 1046 02/26/23 1200        Past Medical History:  Diagnosis Date   Arthritis    Bilateral hydronephrosis    GERD (gastroesophageal reflux disease)    Hearing decreased    History of hepatitis A    Hypertension    Incontinence of urine     Past Surgical History:  Procedure Laterality Date   CYSTOSCOPY W/ RETROGRADES  10/21/2012   Procedure: CYSTOSCOPY WITH RETROGRADE PYELOGRAM;  Surgeon: Anner Crete, MD;  Location: [facility];  Service: Urology;  Laterality: Bilateral;  cystoscopy bilateral retrograde  ureteral dilatation     NOSE SURGERY  1959   PROSTATE SURGERY     PROSTATECTOMY  2004   TONSILLECTOMY     TRANSURETHRAL RESECTION OF PROSTATE  10/21/2012   Procedure: TRANSURETHRAL RESECTION OF THE PROSTATE (TURP);  Surgeon: Anner Crete, MD;  Location: [facility];  Service: Urology;  Laterality: N/A;  Home Medications:  Current Meds  Medication Sig   acetaminophen (TYLENOL) 325 MG tablet Take 2 tablets (650 mg total) by mouth every 6 (six) hours as needed for mild pain or fever.   artificial tears (LACRILUBE) OINT ophthalmic ointment Place into both eyes every 4 (four) hours as needed for dry eyes.   clotrimazole-betamethasone (LOTRISONE) cream Apply 1 Application topically 2 (two) times daily.   finasteride (PROSCAR) 5 MG tablet Take 5 mg by mouth daily.   fluticasone (FLONASE) 50 MCG/ACT nasal spray Place 2 sprays into  both nostrils daily.   hydrochlorothiazide (HYDRODIURIL) 25 MG tablet Take 25 mg by mouth daily.   labetalol (NORMODYNE) 100 MG tablet Take 1 tablet (100 mg total) by mouth daily in the afternoon.   levofloxacin (LEVAQUIN) 250 MG tablet Take 250 mg by mouth daily.   loratadine (CLARITIN) 10 MG tablet Take 1 tablet (10 mg total) by mouth daily.   omeprazole (PRILOSEC) 40 MG capsule Take 40 mg by mouth daily.   oxybutynin (DITROPAN-XL) 5 MG 24 hr tablet Take 10 mg by mouth at bedtime.   polyethylene glycol (MIRALAX / GLYCOLAX) 17 g packet Take 17 g by mouth daily.   tamsulosin (FLOMAX) 0.4 MG CAPS capsule Take 0.4 mg by mouth 2 (two) times daily.    Allergies:  Allergies  Allergen Reactions   Doxazosin Other (See Comments)   Telmisartan Other (See Comments)    Family History  Problem Relation Age of Onset   Stroke Father    Heart attack Father    Stroke Mother     Social History:  reports that he has never smoked. He has never used smokeless tobacco. He reports that he does not drink alcohol and does not use drugs.  ROS: A complete review of systems was performed.  All systems are negative except for pertinent findings as noted.  Physical Exam:  Vital signs in last 24 hours: Temp:  [97.5 F (36.4 C)-98.4 F (36.9 C)] 98.4 F (36.9 C) (04/18 0818) Pulse Rate:  [56-79] 56 (04/18 0818) Resp:  [17-20] 20 (04/18 0818) BP: (108-151)/(47-88) 146/58 (04/18 0818) SpO2:  [95 %-99 %] 99 % (04/18 0818) Constitutional:  Alert, no acute distress HEENT: [place] AT, moist mucus membranes Cardiovascular: No clubbing, cyanosis, or edema Respiratory: Normal respiratory effort Skin: No rashes, bruises or suspicious lesions Neurologic: Grossly intact, no focal deficits, moving all 4 extremities Psychiatric: Normal mood and affect  Laboratory Data:  Recent Labs    02/26/23 0919  WBC 12.5*  HGB 14.1  HCT 42.1   Recent Labs    02/26/23 0919 02/26/23 1935 02/27/23 0422  NA 131* 131* 136   K 2.7* 3.7 3.2*  CL 96* 99 103  CO2 25 20* 26  GLUCOSE 134* 102* 107*  BUN 29* 26* 27*  CREATININE 1.51* 1.41* 1.42*  CALCIUM 11.4* 10.9* 11.1*   Urinalysis    Component Value Date/Time   COLORURINE YELLOW (A) 02/27/2023 1042   APPEARANCEUR HAZY (A) 02/27/2023 1042   LABSPEC 1.009 02/27/2023 1042   PHURINE 7.0 02/27/2023 1042   GLUCOSEU NEGATIVE 02/27/2023 1042   HGBUR SMALL (A) 02/27/2023 1042   BILIRUBINUR NEGATIVE 02/27/2023 1042   KETONESUR NEGATIVE 02/27/2023 1042   PROTEINUR 100 (A) 02/27/2023 1042   UROBILINOGEN 0.2 10/23/2012 1620   NITRITE NEGATIVE 02/27/2023 1042   LEUKOCYTESUR LARGE (A) 02/27/2023 1042   Results for orders placed or performed during the hospital encounter of 02/26/23  Blood Culture (routine x 2)     Status:  None (Preliminary result)   Collection Time: 02/26/23  9:24 AM   Specimen: BLOOD  Result Value Ref Range Status   Specimen Description BLOOD LEFT ARM  Final   Special Requests BOTTLES DRAWN AEROBIC AND ANAEROBIC BCHV  Final   Culture   Final    NO GROWTH < 24 HOURS Performed at [facility], [address] [place], [place] [address]    Report Status PENDING  Incomplete  Blood Culture (routine x 2)     Status: None (Preliminary result)   Collection Time: 02/26/23  9:24 AM   Specimen: BLOOD  Result Value Ref Range Status   Specimen Description BLOOD RIGHT ARM  Final   Special Requests   Final    BOTTLES DRAWN AEROBIC AND ANAEROBIC Blood Culture adequate volume   Culture   Final    NO GROWTH < 24 HOURS Performed at [facility], [address], [place], [place] [address]    Report Status PENDING  Incomplete   Radiologic Imaging: DG Abd 1 View  Result Date: 02/27/2023 CLINICAL DATA:  Constipation EXAM: ABDOMEN - 1 VIEW COMPARISON:  CT 02/13/2020 FINDINGS: Nonobstructive bowel gas pattern. Moderate colonic stool burden most prominent in the ascending and transverse colon. No radiopaque calculi overlie the kidneys.  Degenerative changes of the spine and hips. IMPRESSION: Moderate colonic stool burden most prominent in the ascending and transverse colon. No evidence of bowel obstruction. Electronically Signed   By: Caprice Renshaw M.D.   On: 02/27/2023 10:19   DG Chest Port 1 View  Result Date: 02/26/2023 CLINICAL DATA:  Possible sepsis EXAM: PORTABLE CHEST 1 VIEW COMPARISON:  05/10/2022 FINDINGS: Cardiac shadow is stable. Lungs are well aerated bilaterally. No focal infiltrate or sizable effusion is seen. No bony abnormality is noted. IMPRESSION: No active disease. Electronically Signed   By: Alcide Clever M.D.   On: 02/26/2023 10:28    Assessment & Plan:  89 year old male with a complex urologic history related to BPH requiring Foley catheter placement for acute urinary retention 10 days ago currently admitted for UTI with reports of profound weakness.  Unclear if symptoms are attributable wholly to complicated UTI, suspect age and frailty have contributed to significant physical deconditioning.  That said, not an appropriate to continue antibiotics pending urine culture results.  Given his persistent weakness, I do not recommend an inpatient voiding trial at this time.  Would recommend keeping Foley catheter in place with plans for outpatient follow-up with Dr. Richardo Hanks in 2 to 3 weeks to discuss bladder management options.  With his age and frailty, he is likely a poor candidate for outlet procedures and chronic indwelling Foley catheter would be a reasonable consideration.  Thank you for involving me in this patient's care, please page with any further questions or concerns.  Carman Ching, PA-C 02/28/2023 8:53 AM

## 2023-02-28 NOTE — Plan of Care (Signed)

## 2023-02-28 NOTE — Progress Notes (Addendum)
PROGRESS NOTE    Jeffrey Dennis  WUJ:[id number] DOB: [birth year] DOA: 02/26/2023 PCP: Danella Penton, MD    Brief Narrative:   89 y.o. male with medical history significant of hearing impairment, hypertension, CKD stage IIIa, osteoarthritis, BPH s/p TURP overflow incontinence, gout presenting w/ UTI, urinary retention.  Patient has been evaluated roughly 1 week ago at outside emergency room for acute urinary retention.  Catheter placed.  Was due for urology follow-up within this week.  Since catheter placement, patient with worsening weakness.  Has had significant cloudy urine output.  Was placed on course of cefdinir and then transition to Levaquin for urinary treatment.  Still with lower abdominal pain weakness and malaise.  No reported bloody urine output from the Foley.  Has extensive history of BPH status post TURP.  Prior urinary cultures growing E. coli sensitive to Rocephin.  No reported chest pain or shortness of breath.  No reported nausea or vomiting.  Positive decreased p.o. intake. Presented to the ER afebrile, hemodynamically stable.  Satting well on room air.  White count 12.5, hemoglobin 14.1, urinalysis indicative of infection, lactate 2.2, creatinine 1.51, potassium 2.7.  Started on IV Rocephin in the ER.  4/18: Remains deconditioned.  Is reluctant for SNF.  Inpatient urology consult requested.  Recommend keeping foley in place for outpatient voiding trial in 2-3 weeks   Assessment & Plan:   Principal Problem:   UTI (urinary tract infection) Active Problems:   BPH (benign prostatic hyperplasia)   Hypertension   Chronic kidney disease, stage 3a   Primary hyperparathyroidism   Hypokalemia  * UTI (urinary tract infection) Acute UTI in setting of urinary retention foley in place w/ baseline hx/o BPH s/p TURP overflow incontinence  Failed outpt course of cefdinir and levaquin  Prior urine cultures w/ e coli sensitive to rocephin  Unfortunately urine culture was not sent on  repeat urinalysis Foley catheter exchanged in ED Plan: Continue IV Rocephin for now Repeat urine culture though may be impacted by exposure antibiotics.  Pending Continue Foley catheter Appreciate urology recs Keep foley in place on dc    BPH (benign prostatic hyperplasia) Acute urinary retention with foley in place and UTI  Cont flomax  Foley exchanged in ED Continue Foley, discharged with Foley in place   Hypertension BP stable  Titrate home regimen      Chronic kidney disease, stage 3a Baseline Cr 1.2-1.5  Urine appears dark Continue IVF for today  Hypokalemia K 2.7  Replete  Check mag level      Primary hyperparathyroidism Ca 11.4  Cont sensipar   Constipation Patient endorses 8 to 9 days since last BM KUB with moderate stool burden, no signs of ileus or obstruction Initiate bowel regimen S/p SMOG enema  Frailty Complicated grief Per family patient has been sitting in the chair for 8 days prior to admission.  Unfortunately patient lost his spouse approximately 10 days prior.  Suspect age and frailty contributing to current substantial weakness.  Patient is reluctant for skilled nursing facility.  Therapy evaluations requested.   DVT prophylaxis: SQ lovenox Code Status: DNR Family Communication: Daughter at bedside 4/17, 4/18 Disposition Plan: Status is: Inpatient Remains inpatient appropriate because: UTI on IV antibiotics   Level of care: Telemetry Medical  Consultants:  None  Procedures:  None  Antimicrobials: Ceftriaxone   Subjective: Seen and examined.  Resting in bed.  History limited by hearing loss.  No acute distress.  Objective: Vitals:   02/28/23 0012 02/28/23 0427 02/28/23  0818 02/28/23 1138  BP: 127/62 (!) 108/47 (!) 146/58 (!) 146/70  Pulse: 71 (!) 58 (!) 56 79  Resp: Temp: 97.6 F (36.4 C) 97.8 F (36.6 C) 98.4 F (36.9 C) 97.9 F (36.6 C)  TempSrc:    Oral  SpO2: 95% 95% 99% 96%  Weight:         Intake/Output Summary (Last 24 hours) at 02/28/2023 1220 Last data filed at 02/28/2023 0559 Gross per 24 hour  Intake 1469.63 ml  Output 2000 ml  Net -530.37 ml   Filed Weights   02/26/23 0917  Weight: 95.3 kg    Examination:  General exam: No acute distress.  Frail-appearing. Respiratory system: Clear to auscultation. Respiratory effort normal. Cardiovascular system: S1-S2, RRR, no murmurs, no pedal edema Gastrointestinal system: Soft, nontender, nondistended, normal bowel sounds Central nervous system: Alert and oriented. No focal neurological deficits. Extremities: Symmetric 5 x 5 power. Skin: No rashes, lesions or ulcers Psychiatry: Judgement and insight appear normal. Mood & affect appropriate.     Data Reviewed: I have personally reviewed following labs and imaging studies  CBC: Recent Labs  Lab 02/26/23 0919  WBC 12.5*  HGB 14.1  HCT 42.1  MCV 88.8  PLT 207   Basic Metabolic Panel: Recent Labs  Lab 02/26/23 0919 02/26/23 0924 02/26/23 1935 02/27/23 0422  NA 131*  --  131* 136  K 2.7*  --  3.7 3.2*  CL 96*  --  99 103  CO2 25  --  20* 26  GLUCOSE 134*  --  102* 107*  BUN 29*  --  26* 27*  CREATININE 1.51*  --  1.41* 1.42*  CALCIUM 11.4*  --  10.9* 11.1*  MG  --  1.5*  --   --    GFR: Estimated Creatinine Clearance: 40.7 mL/min (A) (by C-G formula based on SCr of 1.42 mg/dL (H)). Liver Function Tests: Recent Labs  Lab 02/27/23 0422  AST 20  ALT 18  ALKPHOS 71  BILITOT 0.8  PROT 5.8*  ALBUMIN 3.1*   No results for input(s): "LIPASE", "AMYLASE" in the last 168 hours. No results for input(s): "AMMONIA" in the last 168 hours. Coagulation Profile: No results for input(s): "INR", "PROTIME" in the last 168 hours. Cardiac Enzymes: No results for input(s): "CKTOTAL", "CKMB", "CKMBINDEX", "TROPONINI" in the last 168 hours. BNP (last 3 results) No results for input(s): "PROBNP" in the last 8760 hours. HbA1C: No results for input(s): "HGBA1C"  in the last 72 hours. CBG: No results for input(s): "GLUCAP" in the last 168 hours. Lipid Profile: No results for input(s): "CHOL", "HDL", "LDLCALC", "TRIG", "CHOLHDL", "LDLDIRECT" in the last 72 hours. Thyroid Function Tests: No results for input(s): "TSH", "T4TOTAL", "FREET4", "T3FREE", "THYROIDAB" in the last 72 hours. Anemia Panel: No results for input(s): "VITAMINB12", "FOLATE", "FERRITIN", "TIBC", "IRON", "RETICCTPCT" in the last 72 hours. Sepsis Labs: Recent Labs  Lab 02/26/23 0924 02/26/23 1308 02/27/23 1241  PROCALCITON  --   --  0.11  LATICACIDVEN 2.2* 1.8  --     Recent Results (from the past 240 hour(s))  Blood Culture (routine x 2)     Status: None (Preliminary result)   Collection Time: 02/26/23  9:24 AM   Specimen: BLOOD  Result Value Ref Range Status   Specimen Description BLOOD LEFT ARM  Final   Special Requests BOTTLES DRAWN AEROBIC AND ANAEROBIC BCHV  Final   Culture   Final    NO GROWTH < 24 HOURS Performed at  [facility], [address], [place], [place] [address]    Report Status PENDING  Incomplete  Blood Culture (routine x 2)     Status: None (Preliminary result)   Collection Time: 02/26/23  9:24 AM   Specimen: BLOOD  Result Value Ref Range Status   Specimen Description BLOOD RIGHT ARM  Final   Special Requests   Final    BOTTLES DRAWN AEROBIC AND ANAEROBIC Blood Culture adequate volume   Culture   Final    NO GROWTH < 24 HOURS Performed at [facility], [address] [place], [place] [address]    Report Status PENDING  Incomplete  Urine Culture (for pregnant, neutropenic or urologic patients or patients with an indwelling urinary catheter)     Status: None   Collection Time: 02/27/23 10:42 AM   Specimen: Urine, Clean Catch  Result Value Ref Range Status   Specimen Description   Final    URINE, CLEAN CATCH Performed at [facility], [address], [place], [place] [address]    Special Requests   Final     NONE Performed at [facility], [address], [place], [place] [address]    Culture   Final    NO GROWTH Performed at [facility], [address], [place], [place] [address]    Report Status 02/28/2023 FINAL  Final         Radiology Studies: DG Abd 1 View  Result Date: 02/27/2023 CLINICAL DATA:  Constipation EXAM: ABDOMEN - 1 VIEW COMPARISON:  CT 02/13/2020 FINDINGS: Nonobstructive bowel gas pattern. Moderate colonic stool burden most prominent in the ascending and transverse colon. No radiopaque calculi overlie the kidneys. Degenerative changes of the spine and hips. IMPRESSION: Moderate colonic stool burden most prominent in the ascending and transverse colon. No evidence of bowel obstruction. Electronically Signed   By: Caprice Renshaw M.D.   On: 02/27/2023 10:19        Scheduled Meds:  Chlorhexidine Gluconate Cloth  6 each Topical Q0600   cinacalcet  30 mg Oral BID WC   enoxaparin (LOVENOX) injection  40 mg Subcutaneous Q24H   finasteride  5 mg Oral Daily   labetalol  100 mg Oral Q1500   loratadine  10 mg Oral Daily   oxybutynin  10 mg Oral QHS   pantoprazole  40 mg Oral Daily   polyethylene glycol  17 g Oral Daily   senna-docusate  1 tablet Oral BID   tamsulosin  0.4 mg Oral BID   Continuous Infusions:  cefTRIAXone (ROCEPHIN)  IV 1 g (02/28/23 1209)   lactated ringers 1,000 mL with potassium chloride 20 mEq infusion 100 mL/hr at 02/28/23 0559     LOS: 2 days     Tresa Moore, MD [facility]   If 7PM-7AM, please contact night-coverage  02/28/2023, 12:20 PM

## 2023-02-28 NOTE — Progress Notes (Signed)
OT Cancellation Note  Patient Details Name: Jeffrey Dennis MRN: [id number] DOB: October 18, 1930   Cancelled Treatment:    Reason Eval/Treat Not Completed: Other (comment). Consult received, chart reviewed. Upon attempt, pt with staff for care. Will re-attempt at later date/time as pt is available.   Arman Filter., MPH, MS, OTR/L ascom [phone] 02/28/23, 2:43 PM

## 2023-02-28 NOTE — Evaluation (Signed)
Physical Therapy Evaluation Patient Details Name: Jeffrey Dennis MRN: [id number] DOB: 1930-10-29 Today's Date: 02/28/2023  History of Present Illness  Pt is a 89 y.o. male with past medical history significant for arthritis who presents to the emergency department with right shoulder pain and hip pain.  States that he has had a fall over the past couple of days.   Clinical Impression  Patient alert, agreeable to PT but fearful of mobility, reported he is very weak and needs several people to assist. Per pt in the last 8-9 days he has been stuck in his recliner and has been unable to sit on his rollator and kick around his house like he has been able to in the past. He has aides that assist with ADLs but that do not normally provide physical assistance.  Supine to sit with maxA, pt able to utilize bed rails and shift weight but did require significant assistance to complete the transfer. Sit <> stand twice with RW, modAx2. Pt unable to truly ambulate, one side step at EOB. Pt very fearful throughout and noted to fatigue quickly.  Overall the patient demonstrated deficits (see "PT Problem List") that impede the patient's functional abilities, safety, and mobility and would benefit from skilled PT intervention. Recommendation is to continue skilled PT intervention to maximize function and safety.       Recommendations for follow up therapy are one component of a multi-disciplinary discharge planning process, led by the attending physician.  Recommendations may be updated based on patient status, additional functional criteria and insurance authorization.  Follow Up Recommendations Can patient physically be transported by private vehicle: No     Assistance Recommended at Discharge Frequent or constant Supervision/Assistance  Patient can return home with the following  Two people to help with walking and/or transfers;Assistance with cooking/housework;Two people to help with  bathing/dressing/bathroom;Assistance with feeding;Help with stairs or ramp for entrance;Direct supervision/assist for medications management;Assist for transportation    Equipment Recommendations Hospital bed;Other (comment) (sit to stand lift)  Recommendations for Other Services       Functional Status Assessment Patient has had a recent decline in their functional status and demonstrates the ability to make significant improvements in function in a reasonable and predictable amount of time.     Precautions / Restrictions Precautions Precautions: Fall Restrictions Weight Bearing Restrictions: No      Mobility  Bed Mobility Overal bed mobility: Needs Assistance Bed Mobility: Supine to Sit, Sit to Supine     Supine to sit: Max assist Sit to supine: +2 for physical assistance, Mod assist        Transfers Overall transfer level: Needs assistance Equipment used: Rolling walker (2 wheels) Transfers: Sit to/from Stand Sit to Stand: Mod assist, +2 physical assistance, From elevated surface                Ambulation/Gait               General Gait Details: unable. 1 step at side of bed, very effortful and painful for pt  Stairs            Wheelchair Mobility    Modified Rankin (Stroke Patients Only)       Balance Overall balance assessment: Needs assistance Sitting-balance support: Feet supported Sitting balance-Leahy Scale: Fair     Standing balance support: Reliant on assistive device for balance Standing balance-Leahy Scale: Poor  Pertinent Vitals/Pain Pain Assessment Pain Assessment: Faces Faces Pain Scale: Hurts even more Pain Location: bilateral hips, back Pain Descriptors / Indicators: Grimacing, Guarding, Moaning Pain Intervention(s): Limited activity within patient's tolerance, Monitored during session, Repositioned    Home Living Family/patient expects to be discharged to:: Private  residence Living Arrangements: Other (Comment) (family in and out) Available Help at Discharge: Personal care attendant Type of Home: House Home Access: Stairs to enter Entrance Stairs-Rails: Left Entrance Stairs-Number of Steps: 1   Home Layout: One level Home Equipment: Rollator (4 wheels)      Prior Function Prior Level of Function : Needs assist       Physical Assist : ADLs (physical);Mobility (physical) Mobility (physical): Transfers;Gait;Stairs   Mobility Comments: Pt reports he transfers to rollator and rides on that throughout the house with assist from aids; but has been unable to do this for about 9 days, stuck in a recliner ADLs Comments: pt reports he is bathed in bed, assist for all ADL/IADL     Hand Dominance        Extremity/Trunk Assessment   Upper Extremity Assessment Upper Extremity Assessment: Generalized weakness    Lower Extremity Assessment Lower Extremity Assessment: Generalized weakness       Communication   Communication: HOH  Cognition Arousal/Alertness: Awake/alert Behavior During Therapy: WFL for tasks assessed/performed Overall Cognitive Status: Within Functional Limits for tasks assessed                                          General Comments      Exercises     Assessment/Plan    PT Assessment Patient needs continued PT services  PT Problem List Decreased strength;Decreased mobility;Decreased range of motion;Decreased activity tolerance;Decreased balance;Pain;Decreased knowledge of use of DME       PT Treatment Interventions DME instruction;Therapeutic activities;Gait training;Therapeutic exercise;Patient/family education;Stair training;Balance training;Functional mobility training;Neuromuscular re-education    PT Goals (Current goals can be found in the Care Plan section)  Acute Rehab PT Goals Patient Stated Goal: to go home PT Goal Formulation: With patient Time For Goal Achievement:  03/14/23 Potential to Achieve Goals: Fair    Frequency Min 2X/week     Co-evaluation               AM-PAC PT "6 Clicks" Mobility  Outcome Measure Help needed turning from your back to your side while in a flat bed without using bedrails?: A Lot Help needed moving from lying on your back to sitting on the side of a flat bed without using bedrails?: Total Help needed moving to and from a bed to a chair (including a wheelchair)?: Total Help needed standing up from a chair using your arms (e.g., wheelchair or bedside chair)?: Total Help needed to walk in hospital room?: Total Help needed climbing 3-5 steps with a railing? : Total 6 Click Score: 7    End of Session Equipment Utilized During Treatment: Gait belt Activity Tolerance: Patient limited by fatigue;Patient limited by pain Patient left: in bed;with call bell/phone within reach;with bed alarm set Nurse Communication: Mobility status PT Visit Diagnosis: Other abnormalities of gait and mobility (R26.89);Muscle weakness (generalized) (M62.81);Difficulty in walking, not elsewhere classified (R26.2)    Time: 4098-1191 PT Time Calculation (min) (ACUTE ONLY): 27 min   Charges:   PT Evaluation $PT Eval Low Complexity: 1 Low PT Treatments $Therapeutic Activity: 23-37 mins  Olga Coaster PT, DPT 3:50 PM,02/28/23

## 2023-02-28 NOTE — TOC Progression Note (Signed)
Transition of Care [facility]) - Progression Note    Patient Details  Name: Jeffrey Dennis MRN: [id number] Date of Birth: [birth year]  Transition [facility]) CM/SW Contact  Allena Katz, LCSW Phone Number: 02/28/2023, 3:51 PM  Clinical Narrative:   CSW spoke with patient. Pt reports he does not want SNF and is very adamant. He says that he does not have funds to pay for additional aides at home but does require more assistance than he has available.. Pt reports he got a grant through [employer] for aides services and an aide comes out 5 days a week for a couple hours a day.   [name] his daughter reports that she lives with him but cannot physically lift him. Pt reports that he receives a check from the [place], the [employer] and social security.  Pt reports wife just passed away and lived at [facility]. Pt may be eligible for additional resource through the [place] and may qualify for additional aide services and is agreeable to [facility] referral    Pt would like CSW to reach out to friend Karren Burly [phone], to see if he can move his old bed out so we can get a hospital bed set up.   CSW also waiting for palliative to follow up with patient.   Expected Discharge Plan and Services                                               Social Determinants of Health (SDOH) Interventions SDOH Screenings   Food Insecurity: No Food Insecurity (02/26/2023)  Housing: Low Risk  (02/26/2023)  Transportation Needs: No Transportation Needs (02/26/2023)  Utilities: Not At Risk (02/26/2023)  Tobacco Use: Low Risk  (02/26/2023)    Readmission Risk Interventions     No data to display

## 2023-02-28 NOTE — TOC Progression Note (Addendum)
Transition of Care [facility]) - Progression Note    Patient Details  Name: Jeffrey Dennis MRN: [id number] Date of Birth: [birth year]  Transition of Care [facility]) CM/SW Contact  Allena Katz, LCSW Phone Number: 02/28/2023, 1:07 PM  Clinical Narrative:   [facility] consult for [facility] DME needs, potential SNF placement. CSW waiting for PT/OT consultation and will discuss with patient   3:01pm  CSW spoke with daughter about placement.  Pt is very adamant that he is not going to a skilled facility. CSW spoke with daughter about patient being unable to stand and being a two person assist. Daughter reports the most they can do is get home health in and states they pay a caregiver for one hour in the morning and a few hours at night. Daughter states pt will be alone at times as they cannot afford to private pay for any amount of time. Daughter is agreeable to hospital bed but states pt needs to be made aware as she is not sure he would be agreeable. CSW to follow up with patient.      Expected Discharge Plan and Services                                               Social Determinants of Health (SDOH) Interventions SDOH Screenings   Food Insecurity: No Food Insecurity (02/26/2023)  Housing: Low Risk  (02/26/2023)  Transportation Needs: No Transportation Needs (02/26/2023)  Utilities: Not At Risk (02/26/2023)  Tobacco Use: Low Risk  (02/26/2023)    Readmission Risk Interventions     No data to display

## 2023-03-01 DIAGNOSIS — R531 Weakness: Secondary | ICD-10-CM

## 2023-03-01 DIAGNOSIS — Z7189 Other specified counseling: Secondary | ICD-10-CM

## 2023-03-01 DIAGNOSIS — Z515 Encounter for palliative care: Secondary | ICD-10-CM

## 2023-03-01 DIAGNOSIS — N3000 Acute cystitis without hematuria: Secondary | ICD-10-CM | POA: Diagnosis not present

## 2023-03-01 LAB — CULTURE, BLOOD (ROUTINE X 2)

## 2023-03-01 LAB — CBC WITH DIFFERENTIAL/PLATELET
Abs Immature Granulocytes: 0.05 10*3/uL (ref 0.00–0.07)
Basophils Absolute: 0.1 10*3/uL (ref 0.0–0.1)
Basophils Relative: 1 %
Eosinophils Absolute: 0.3 10*3/uL (ref 0.0–0.5)
Eosinophils Relative: 4 %
HCT: 36.3 % — ABNORMAL LOW (ref 39.0–52.0)
Hemoglobin: 11.9 g/dL — ABNORMAL LOW (ref 13.0–17.0)
Immature Granulocytes: 1 %
Lymphocytes Relative: 30 %
Lymphs Abs: 2.6 10*3/uL (ref 0.7–4.0)
MCH: 29.8 pg (ref 26.0–34.0)
MCHC: 32.8 g/dL (ref 30.0–36.0)
MCV: 90.8 fL (ref 80.0–100.0)
Monocytes Absolute: 0.6 10*3/uL (ref 0.1–1.0)
Monocytes Relative: 7 %
Neutro Abs: 4.9 10*3/uL (ref 1.7–7.7)
Neutrophils Relative %: 57 %
Platelets: 172 10*3/uL (ref 150–400)
RBC: 4 MIL/uL — ABNORMAL LOW (ref 4.22–5.81)
RDW: 13 % (ref 11.5–15.5)
WBC: 8.5 10*3/uL (ref 4.0–10.5)
nRBC: 0 % (ref 0.0–0.2)

## 2023-03-01 LAB — BASIC METABOLIC PANEL
Anion gap: 7 (ref 5–15)
BUN: 22 mg/dL (ref 8–23)
CO2: 25 mmol/L (ref 22–32)
Calcium: 10.7 mg/dL — ABNORMAL HIGH (ref 8.9–10.3)
Chloride: 104 mmol/L (ref 98–111)
Creatinine, Ser: 1.5 mg/dL — ABNORMAL HIGH (ref 0.61–1.24)
GFR, Estimated: 44 mL/min — ABNORMAL LOW (ref >60)
Glucose, Bld: 101 mg/dL — ABNORMAL HIGH (ref 70–99)
Potassium: 3.7 mmol/L (ref 3.5–5.1)
Sodium: 136 mmol/L (ref 135–145)

## 2023-03-01 NOTE — TOC Progression Note (Signed)
Transition of Care [facility]) - Progression Note    Patient Details  Name: Jeffrey Dennis MRN: [id number] Date of Birth: 1930/02/19  Transition of Care [facility]) CM/SW Contact  Allena Katz, LCSW Phone Number: 03/01/2023, 1:16 PM  Clinical Narrative:     [facility] accepted for PT/OT/RN/AID/SW. Pt to discharge potentially Tuesday depending on when a hospital bed can be delivered to patients room. Pt seen by pallative care and he is declining services.          Expected Discharge Plan and Services                                               Social Determinants of Health (SDOH) Interventions SDOH Screenings   Food Insecurity: No Food Insecurity (02/26/2023)  Housing: Low Risk  (02/26/2023)  Transportation Needs: No Transportation Needs (02/26/2023)  Utilities: Not At Risk (02/26/2023)  Tobacco Use: Low Risk  (02/26/2023)    Readmission Risk Interventions     No data to display

## 2023-03-01 NOTE — Evaluation (Signed)
Occupational Therapy Evaluation Patient Details Name: Jeffrey Dennis MRN: [id number] DOB: [birth year] Today's Date: 03/01/2023   History of Present Illness Pt is a 89 y.o. male with past medical history significant for arthritis who presents to the emergency department with right shoulder pain and hip pain.  States that he has had a fall over the past couple of days.   Clinical Impression   Patient alert, agreeable to OT but refusing OOB mobility, unwilling to transfer into recliner, despite 2 members of rehab staff in room to assist. Pt lives alone, has 1 daughter who lives nearby and visits daily, another out-of-town daughter who visits on Saturday, has personal care aide 2 hrs/day, Mon-Fri. Pt reports he transfers from bed to [facility] with assistance from aide, also transfers to rollator and rolls around his house on rollator seat. Pt reports that for the past 9-10 days prior to admission, he was unable to transfer at all, and remained in his recliner the entire week+. During today's evaluation, pt required Max A for bed mobility. He was able to feed self using his hands but could not manage to hold utensils. Pt endorses mod-severe pain in hips, LE. Pt requires significant level of assistance for all ADL and does not appear to have adequate support at home. Pt would likely benefit from SNF-level care; however he is adamant that he intends to go home. Given those circumstances, recommend HHOT and hospital bed.    Recommendations for follow up therapy are one component of a multi-disciplinary discharge planning process, led by the attending physician.  Recommendations may be updated based on patient status, additional functional criteria and insurance authorization.   Assistance Recommended at Discharge Frequent or constant Supervision/Assistance  Patient can return home with the following A lot of help with walking and/or transfers;A lot of help with bathing/dressing/bathroom;Assistance with  cooking/housework;Direct supervision/assist for medications management;Assist for transportation;Help with stairs or ramp for entrance;Assistance with feeding    Functional Status Assessment  Patient has had a recent decline in their functional status and demonstrates the ability to make significant improvements in function in a reasonable and predictable amount of time.  Equipment Recommendations  Hospital bed    Recommendations for Other Services       Precautions / Restrictions Precautions Precautions: Fall Restrictions Weight Bearing Restrictions: No      Mobility Bed Mobility Overal bed mobility: Needs Assistance Bed Mobility: Supine to Sit, Sit to Supine     Supine to sit: Max assist Sit to supine: Max assist        Transfers                   General transfer comment: Pt refused      Balance Overall balance assessment: Needs assistance Sitting-balance support: Feet supported Sitting balance-Leahy Scale: Fair       Standing balance-Leahy Scale: Zero Standing balance comment: not attempted                           ADL either performed or assessed with clinical judgement   ADL Overall ADL's : Needs assistance/impaired Eating/Feeding: Set up;Supervision/ safety Eating/Feeding Details (indicate cue type and reason): able to peel hard-boiled egg, able to pick up bacon with fingers; had difficulty using utensils                 Lower Body Dressing: Maximal assistance  General ADL Comments: Pt unwilling to attempt any OOB activity     Vision         Perception     Praxis      Pertinent Vitals/Pain Pain Assessment Faces Pain Scale: Hurts even more Pain Location: bilateral hips, back Pain Descriptors / Indicators: Grimacing, Guarding, Moaning, Aching Pain Intervention(s): Limited activity within patient's tolerance, Repositioned     Hand Dominance     Extremity/Trunk Assessment Upper Extremity  Assessment Upper Extremity Assessment: Generalized weakness   Lower Extremity Assessment Lower Extremity Assessment: Generalized weakness       Communication Communication Communication: HOH   Cognition Arousal/Alertness: Awake/alert Behavior During Therapy: WFL for tasks assessed/performed Overall Cognitive Status: Within Functional Limits for tasks assessed                                 General Comments: Alert and oriented X 3, although does not seem aware of extent of his care needs and unsafe situation at home     General Comments       Exercises Other Exercises Other Exercises: Educ re: home safety, transfer techniques, DC recs   Shoulder Instructions      Home Living Family/patient expects to be discharged to:: Private residence   Available Help at Discharge: Personal care attendant;Family;Available PRN/intermittently Type of Home: House Home Access: Stairs to enter Entrance Stairs-Number of Steps: 1 Entrance Stairs-Rails: Left Home Layout: One level               Home Equipment: Rollator (4 wheels);BSC/3in1          Prior Functioning/Environment Prior Level of Function : Needs assist             Mobility Comments: Pt reports he transfers to rollator and rides on that throughout the house with assist from aids; but has been unable to do this for about 9 days, stuck in a recliner ADLs Comments: pt reports he is bathed in bed, assist for all ADL/IADL        OT Problem List: Decreased strength;Decreased activity tolerance;Impaired balance (sitting and/or standing);Pain      OT Treatment/Interventions: Self-care/ADL training;Balance training;Therapeutic exercise;DME and/or AE instruction;Therapeutic activities;Patient/family education    OT Goals(Current goals can be found in the care plan section) Acute Rehab OT Goals Patient Stated Goal: to go home OT Goal Formulation: With patient Time For Goal Achievement: 03/15/23 Potential  to Achieve Goals: Good ADL Goals Pt Will Perform Grooming: sitting;with min assist Pt Will Transfer to Toilet: bedside commode;squat pivot transfer;with min assist Pt/caregiver will Perform Home Exercise Program: Increased ROM;Increased strength;With Supervision  OT Frequency: Min 2X/week    Co-evaluation              AM-PAC OT "6 Clicks" Daily Activity     Outcome Measure Help from another person eating meals?: A Little Help from another person taking care of personal grooming?: A Lot Help from another person toileting, which includes using toliet, bedpan, or urinal?: A Lot Help from another person bathing (including washing, rinsing, drying)?: A Lot Help from another person to put on and taking off regular upper body clothing?: A Lot Help from another person to put on and taking off regular lower body clothing?: A Lot 6 Click Score: 13   End of Session    Activity Tolerance: Patient limited by lethargy Patient left: in bed;with call bell/phone within reach;with bed alarm set  OT Visit Diagnosis:  Unsteadiness on feet (R26.81);Other abnormalities of gait and mobility (R26.89);Muscle weakness (generalized) (M62.81);History of falling (Z91.81);Pain Pain - part of body: Hip;Leg;Shoulder                Time: 1610-9604 OT Time Calculation (min): 32 min Charges:  OT General Charges $OT Visit: 1 Visit OT Evaluation $OT Eval Low Complexity: 1 Low OT Treatments $Self Care/Home Management : 23-37 mins Latina Craver, PhD, MS, OTR/L 03/01/23, 12:43 PM

## 2023-03-01 NOTE — TOC CM/SW Note (Signed)
Patient suffers from Generalized Weakness which prevents him from being able to reposition himself in a regular bed. Pt would benefit from Hospital bed for repositioning.

## 2023-03-01 NOTE — Care Management Important Message (Signed)
Important Message  Patient Details  Name: Jeffrey Dennis MRN: [id number] Date of Birth: Oct 06, 1930   Medicare Important Message Given:  Yes     [name] A Charolette Bultman 03/01/2023, 1:51 PM

## 2023-03-01 NOTE — Progress Notes (Signed)
PROGRESS NOTE    Jeffrey Dennis  NUU:[id number] DOB: 1930/03/24 DOA: 02/26/2023 PCP: Danella Penton, MD    Brief Narrative:   89 y.o. male with medical history significant of hearing impairment, hypertension, CKD stage IIIa, osteoarthritis, BPH s/p TURP overflow incontinence, gout presenting w/ UTI, urinary retention.  Patient has been evaluated roughly 1 week ago at outside emergency room for acute urinary retention.  Catheter placed.  Was due for urology follow-up within this week.  Since catheter placement, patient with worsening weakness.  Has had significant cloudy urine output.  Was placed on course of cefdinir and then transition to Levaquin for urinary treatment.  Still with lower abdominal pain weakness and malaise.  No reported bloody urine output from the Foley.  Has extensive history of BPH status post TURP.  Prior urinary cultures growing E. coli sensitive to Rocephin.  No reported chest pain or shortness of breath.  No reported nausea or vomiting.  Positive decreased p.o. intake. Presented to the ER afebrile, hemodynamically stable.  Satting well on room air.  White count 12.5, hemoglobin 14.1, urinalysis indicative of infection, lactate 2.2, creatinine 1.51, potassium 2.7.  Started on IV Rocephin in the ER.  4/18: Remains deconditioned.  Is reluctant for SNF.  Inpatient urology consult requested.  Recommend keeping foley in place for outpatient voiding trial in 2-3 weeks  4/19: Palliative care consult appreciated.  Patient is significantly weak and deconditioned and would benefit from placement in skilled nursing facility/monitored setting.  However despite our strong recommendations from multiple sources on the care team the patient is adamant that he will return home.   Assessment & Plan:   Principal Problem:   UTI (urinary tract infection) Active Problems:   BPH (benign prostatic hyperplasia)   Hypertension   Chronic kidney disease, stage 3a   Primary hyperparathyroidism    Hypokalemia  * UTI (urinary tract infection) Acute UTI in setting of urinary retention foley in place w/ baseline hx/o BPH s/p TURP overflow incontinence  Failed outpt course of cefdinir and levaquin  Prior urine cultures w/ e coli sensitive to rocephin  Unfortunately urine culture was not sent on repeat urinalysis Foley catheter exchanged in ED Plan: Continue IV Rocephin for now.  5-day stop date in place.  Repeat urine culture no growth to date but likely impacted by previous exposure to antibiotics.  Continue Foley catheter.  Outpatient follow-up with urology 2 to 3 weeks for voiding trial.  Frailty Complicated grief Per family patient has been sitting in the chair for 8 days prior to admission.  Unfortunately patient lost his spouse approximately 10 days prior.  Suspect age and frailty contributing to current substantial weakness.  Patient is adamantly refused placement in skilled nursing facility/monitored setting despite multiple members on care team relating our concerns regarding his safety at home.  He is aware of our recommendations and has repeatedly declined.  He has also declined outpatient palliative services.  He has also declined placement of a life alert system.  He has decision-making capability at this time.  If patient were to return home we will require as much home health as possible.  Home hospital bed DME also ordered.  Discharge pending delivery of DME.  Patient will remain high risk for hospital readmission  BPH (benign prostatic hyperplasia) Acute urinary retention with foley in place and UTI  Cont flomax  Foley exchanged in ED Continue Foley, discharged with Foley in place   Hypertension BP stable  Titrate home regimen  Chronic kidney disease, stage 3a Baseline Cr 1.2-1.5  Urine appears dark Continue IVF for today  Hypokalemia K 2.7  Replete  Check mag level      Primary hyperparathyroidism Ca 11.4  Cont sensipar   Constipation Patient  endorses 8 to 9 days since last BM KUB with moderate stool burden, no signs of ileus or obstruction Initiate bowel regimen S/p SMOG enema Resolved     DVT prophylaxis: SQ lovenox Code Status: DNR Family Communication: Daughter at bedside 4/17, 4/18 Disposition Plan: Status is: Inpatient Remains inpatient appropriate because: UTI on IV antibiotics   Level of care: Telemetry Medical  Consultants:  None  Procedures:  None  Antimicrobials: Ceftriaxone   Subjective: Seen and examined.  Resting in bed.  History limited by hearing loss.  No acute distress.  Objective: Vitals:   02/28/23 2146 03/01/23 0011 03/01/23 0505 03/01/23 0758  BP: 134/77 (!) 148/63 (!) 111/59 (!) 126/59  Pulse: 65 62 74 68  Resp: 20 20 20 18   Temp: 97.7 F (36.5 C) 97.7 F (36.5 C) 98.5 F (36.9 C) 98.3 F (36.8 C)  TempSrc:   Oral   SpO2: 94% 94% 96% 96%  Weight:      Height:        Intake/Output Summary (Last 24 hours) at 03/01/2023 1216 Last data filed at 02/28/2023 1605 Gross per 24 hour  Intake --  Output 1300 ml  Net -1300 ml   Filed Weights   02/26/23 0917  Weight: 95.3 kg    Examination:  General exam: NAD.  Appears deconditioned Respiratory system: Bibasilar crackles.  Normal work of breathing.  Room air Cardiovascular system: S1-S2, RRR, no murmurs, no pedal edema Gastrointestinal system: Soft, nontender, nondistended, normal bowel sounds Central nervous system: Alert and oriented. No focal neurological deficits. Extremities: Symmetric 5 x 5 power. Skin: No rashes, lesions or ulcers Psychiatry: Judgement and insight appear normal. Mood & affect appropriate.     Data Reviewed: I have personally reviewed following labs and imaging studies  CBC: Recent Labs  Lab 02/26/23 0919 03/01/23 0821  WBC 12.5* 8.5  NEUTROABS  --  4.9  HGB 14.1 11.9*  HCT 42.1 36.3*  MCV 88.8 90.8  PLT 207 172   Basic Metabolic Panel: Recent Labs  Lab 02/26/23 0919 02/26/23 0924  02/26/23 1935 02/27/23 0422 02/28/23 1229 03/01/23 0821  NA 131*  --  131* 136 135 136  K 2.7*  --  3.7 3.2* 4.0 3.7  CL 96*  --  99 103 106 104  CO2 25  --  20* 26 23 25   GLUCOSE 134*  --  102* 107* 112* 101*  BUN 29*  --  26* 27* 21 22  CREATININE 1.51*  --  1.41* 1.42* 1.46* 1.50*  CALCIUM 11.4*  --  10.9* 11.1* 10.7* 10.7*  MG  --  1.5*  --   --   --   --    GFR: Estimated Creatinine Clearance: 38.6 mL/min (A) (by C-G formula based on SCr of 1.5 mg/dL (H)). Liver Function Tests: Recent Labs  Lab 02/27/23 0422  AST 20  ALT 18  ALKPHOS 71  BILITOT 0.8  PROT 5.8*  ALBUMIN 3.1*   No results for input(s): "LIPASE", "AMYLASE" in the last 168 hours. No results for input(s): "AMMONIA" in the last 168 hours. Coagulation Profile: No results for input(s): "INR", "PROTIME" in the last 168 hours. Cardiac Enzymes: No results for input(s): "CKTOTAL", "CKMB", "CKMBINDEX", "TROPONINI" in the last 168 hours. BNP (last  3 results) No results for input(s): "PROBNP" in the last 8760 hours. HbA1C: No results for input(s): "HGBA1C" in the last 72 hours. CBG: No results for input(s): "GLUCAP" in the last 168 hours. Lipid Profile: No results for input(s): "CHOL", "HDL", "LDLCALC", "TRIG", "CHOLHDL", "LDLDIRECT" in the last 72 hours. Thyroid Function Tests: No results for input(s): "TSH", "T4TOTAL", "FREET4", "T3FREE", "THYROIDAB" in the last 72 hours. Anemia Panel: No results for input(s): "VITAMINB12", "FOLATE", "FERRITIN", "TIBC", "IRON", "RETICCTPCT" in the last 72 hours. Sepsis Labs: Recent Labs  Lab 02/26/23 1610 02/26/23 1308 02/27/23 1241  PROCALCITON  --   --  0.11  LATICACIDVEN 2.2* 1.8  --     Recent Results (from the past 240 hour(s))  Blood Culture (routine x 2)     Status: None (Preliminary result)   Collection Time: 02/26/23  9:24 AM   Specimen: BLOOD  Result Value Ref Range Status   Specimen Description BLOOD LEFT ARM  Final   Special Requests BOTTLES DRAWN  AEROBIC AND ANAEROBIC BCHV  Final   Culture   Final    NO GROWTH 3 DAYS Performed at [facility], [address] [place], [place] [address]    Report Status PENDING  Incomplete  Blood Culture (routine x 2)     Status: None (Preliminary result)   Collection Time: 02/26/23  9:24 AM   Specimen: BLOOD  Result Value Ref Range Status   Specimen Description BLOOD RIGHT ARM  Final   Special Requests   Final    BOTTLES DRAWN AEROBIC AND ANAEROBIC Blood Culture adequate volume   Culture   Final    NO GROWTH 3 DAYS Performed at [facility], [address], [place], [place] [address]    Report Status PENDING  Incomplete  Urine Culture (for pregnant, neutropenic or urologic patients or patients with an indwelling urinary catheter)     Status: None   Collection Time: 02/27/23 10:42 AM   Specimen: Urine, Clean Catch  Result Value Ref Range Status   Specimen Description   Final    URINE, CLEAN CATCH Performed at [facility], [address] [place], [place] [address]    Special Requests   Final    NONE Performed at [facility], [address], [place], [place] [address]    Culture   Final    NO GROWTH Performed at [facility], [address], [place], [place] [address]    Report Status 02/28/2023 FINAL  Final         Radiology Studies: No results found.      Scheduled Meds:  Chlorhexidine Gluconate Cloth  6 each Topical Q0600   cinacalcet  30 mg Oral BID WC   enoxaparin (LOVENOX) injection  40 mg Subcutaneous Q24H   finasteride  5 mg Oral Daily   labetalol  100 mg Oral Q1500   loratadine  10 mg Oral Daily   oxybutynin  10 mg Oral QHS   pantoprazole  40 mg Oral Daily   polyethylene glycol  17 g Oral Daily   senna-docusate  1 tablet Oral BID   tamsulosin  0.4 mg Oral BID   Continuous Infusions:  cefTRIAXone (ROCEPHIN)  IV 1 g (03/01/23 0849)     LOS: 3 days     Tresa Moore, MD [facility]   If  7PM-7AM, please contact night-coverage  03/01/2023, 12:16 PM

## 2023-03-01 NOTE — Consult Note (Signed)
Consultation Note Date: 03/01/2023   Patient Name: Jeffrey Dennis  DOB: January 07, 1930  MRN: [id number]  Age / Sex: 89 y.o., male  PCP: Danella Penton, MD Referring Physician: Tresa Moore, MD  Reason for Consultation: Establishing goals of care   HPI/Brief Hospital Course: 89 y.o. male  with past medical history of hearing impairment for which he utilizes hearing aids, HTN, CKD stage IIIb, BPH s/p TURB with ongoing issues with overflow incontinence, osteoarthritis and gout admitted from home on 02/26/2023 with increased weakness and UTI.  According to Jeffrey Dennis, he has suffered with urinary retention as well as overflow incontinence for many years. Roughly about 1 week ago, on the day of his wife's funeral, he noted later in the evening he had not urinated throughout the day. He shares this is not unusual for him but typically he is able to empty his bladder with relaxation techniques he learned from his urologist many years ago. On this evening, he was unsuccessful which prompted him to be seen at [facility] ED. He was discharged from ED with antibiotics for UTI and they placed an indwelling catheter for urinary retention. On return home he became progressively weaker each day and he was unable to go to his urology follow-up which also prompted him to come to [facility] ED.  Palliative medicine was consulted for assisting with goals of care conversations.  Subjective:  Extensive chart review has been completed prior to meeting patient including labs, vital signs, imaging, progress notes, orders, and available advanced directive documents from current and previous encounters.  Introduced myself as a Publishing rights manager as a member of the palliative care team. Explained palliative medicine is specialized medical care for people living with serious illness. It focuses on providing relief from the symptoms and stress of a serious illness. The goal is to improve  quality of life for both the patient and the family.   Visited with Mr. Conran at his bedside. Awake and alert, able to answer orientation questions appropriately. No family at bedside during time of visit.  Jeffrey Dennis provides a life review. He joined the [employer] when he was 69 and fought in the [place] War. 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 He was married to his wife, Jeffrey Dennis for many years. She suffered from dementia for which he was he primary caretaker for many years. He shares that she recently passed away earlier this month. He considers her passing a blessing as he feels she was suffering in her last days. He has two daughters, Jeffrey Dennis who lives local to [place] and Jeffrey Dennis who lives in [place]. He has many grandchildren and great-grandchildren. He recalled and shared many stories and life events that lead him to ministry.  Prior to admission and prior to initial diagnosis of UTI-Jeffrey Dennis shares he was able to transfer short distances with assistance of walker but required assistance with completing most ADL's including bathing, dressing and preparing meals. He has in home assistance as well as privately paid aids. In all he has caregivers in his home 7 days a week. Private pay aids come into his home twice daily to assist with getting him in and out of bed. They assist him to his walker that has a seat which he sits in majority of the day and is able to move around his home. About 1 week prior to admission he has not been able to  transfer in and out of bed and has spent most of the time in his lift chair. He reports a poor appetite since initial UTI diagnosis but prior to this his appetite has been good and unchanged.  He complains of chronic arthritis pain for which he takes acetaminophen at home and reports relief from pain. Complains of chronic constipation for which he has suffered from for many years.  He has tried many different medications that he feels does not work for him. He now utilizes MoM when it has been several days since his last BM and when he remembers. We discussed an appropriate daily bowel regimen for which he is not interested in.  Inquired about his perception of his ability to work with physical therapy. He shares that it was challenging and he is much weaker than his baseline. I referenced this with his ability to safely return home with consideration for short-term rehab placement. [name] adamantly states that he refuses discharge to rehab and feels that he has all of the support he needs at home-even with increased weakness.  We discussed outpatient palliative services for which he decline, he again feels he has all of the assistance he needs. I also encouraged him looking into an alert system as I discussed his high risk of falling on his return home, he again declined and did not express interest.  Confirmed DNR. Attempted to illicit further GOC discussions, referencing his current condition and the concern for further complications or recurrent infections. He shares he wishes to continue to pursue treatment and hospitalization. He shares that "He will know when it is his time and he is prepared for that." He feels his urinary complications will be solved with increased follow-up visits with his urologist. We discussed possibility of shifting focus to providing comfort and focusing of quality of life-again he shares he is not interested as his goal is to return home and follow-up with his urologist.  With permission from [name] I attempted to connect with daughter-[name], left VM and awaiting call back. Recommend in person meeting to further discuss GOC with family present.  I discussed importance of continued conversations with family/support persons and all members of their medical team regarding overall plan of care and treatment options ensuring decisions are in  alignment with patients goals of care.  All questions/concerns addressed. Emotional support provided to patient/family/support persons. PMT will continue to follow and support patient as needed.  Objective: Primary Diagnoses: Present on Admission:  UTI (urinary tract infection)  Chronic kidney disease, stage 3a  Primary hyperparathyroidism  BPH (benign prostatic hyperplasia)  Hypertension   Physical Exam Constitutional:      General: He is not in acute distress. Cardiovascular:     Rate and Rhythm: Normal rate.  Pulmonary:     Effort: Pulmonary effort is normal. No respiratory distress.  Skin:    General: Skin is warm and dry.  Neurological:     Mental Status: He is alert.     Motor: Weakness present.     Vital Signs: BP (!) 126/59 (BP Location: Left Arm)   Pulse 68   Temp 98.3 F (36.8 C)   Resp 18   Ht 5\' 11"  (1.803 m)   Wt 95.3 kg   SpO2 96%   BMI 29.29 kg/m  Pain Scale: 0-10 POSS *See Group Information*: S-Acceptable,Sleep, easy to arouse Pain Score: 0-No pain   LBM: Last BM Date : 02/27/23 Baseline Weight: Weight: 95.3 kg Most recent weight: Weight: 95.3 kg  Palliative Assessment/Data: 40%   Assessment and Plan  SUMMARY OF RECOMMENDATIONS   DNR Continue current plan of care Attempt to connect with daughter for in person meeting to continue GOC discussions PMT to continue to follow for ongoing needs and support  Discussed With: Primary team, nursing staff and [facility]   Thank you for this consult and allowing Palliative Medicine to participate in the care of Tyrann D. Lusignan. Palliative medicine will continue to follow and assist as needed.   Time Total: 90 minutes  Time spent includes: Detailed review of medical records (labs, imaging, vital signs), medically appropriate exam (mental status, respiratory, cardiac, skin), discussed with treatment team, counseling and educating patient, family and staff, documenting clinical information, medication  management and coordination of care.   Signed by: Leeanne Deed, DNP, AGNP-C Palliative Medicine    Please contact Palliative Medicine Team phone at [phone] for questions and concerns.  For individual provider: See Loretha Stapler

## 2023-03-02 ENCOUNTER — Inpatient Hospital Stay: Payer: Medicare Other

## 2023-03-02 DIAGNOSIS — N3000 Acute cystitis without hematuria: Secondary | ICD-10-CM | POA: Diagnosis not present

## 2023-03-02 LAB — BASIC METABOLIC PANEL
Anion gap: 5 (ref 5–15)
BUN: 22 mg/dL (ref 8–23)
CO2: 26 mmol/L (ref 22–32)
Calcium: 10.6 mg/dL — ABNORMAL HIGH (ref 8.9–10.3)
Chloride: 105 mmol/L (ref 98–111)
Creatinine, Ser: 1.48 mg/dL — ABNORMAL HIGH (ref 0.61–1.24)
GFR, Estimated: 45 mL/min — ABNORMAL LOW (ref >60)
Glucose, Bld: 109 mg/dL — ABNORMAL HIGH (ref 70–99)
Potassium: 4.2 mmol/L (ref 3.5–5.1)
Sodium: 136 mmol/L (ref 135–145)

## 2023-03-02 LAB — CULTURE, BLOOD (ROUTINE X 2): Culture: NO GROWTH

## 2023-03-02 MED ORDER — LACTULOSE 10 GM/15ML PO SOLN
20.0000 g | Freq: Every day | ORAL | Status: DC
  Administered 2023-03-02 – 2023-03-04 (×3): 20 g via ORAL
  Filled 2023-03-02 (×3): qty 30

## 2023-03-02 MED ORDER — NYSTATIN 100000 UNIT/GM EX POWD
Freq: Three times a day (TID) | CUTANEOUS | Status: DC
  Filled 2023-03-02: qty 15

## 2023-03-02 NOTE — Progress Notes (Signed)
PROGRESS NOTE    Jeffrey Dennis  RUE:[id number] DOB: 06/10/1930 DOA: 02/26/2023 PCP: Danella Penton, MD    Brief Narrative:   89 y.o. male with medical history significant of hearing impairment, hypertension, CKD stage IIIa, osteoarthritis, BPH s/p TURP overflow incontinence, gout presenting w/ UTI, urinary retention.  Patient has been evaluated roughly 1 week ago at outside emergency room for acute urinary retention.  Catheter placed.  Was due for urology follow-up within this week.  Since catheter placement, patient with worsening weakness.  Has had significant cloudy urine output.  Was placed on course of cefdinir and then transition to Levaquin for urinary treatment.  Still with lower abdominal pain weakness and malaise.  No reported bloody urine output from the Foley.  Has extensive history of BPH status post TURP.  Prior urinary cultures growing E. coli sensitive to Rocephin.  No reported chest pain or shortness of breath.  No reported nausea or vomiting.  Positive decreased p.o. intake. Presented to the ER afebrile, hemodynamically stable.  Satting well on room air.  White count 12.5, hemoglobin 14.1, urinalysis indicative of infection, lactate 2.2, creatinine 1.51, potassium 2.7.  Started on IV Rocephin in the ER.  4/18: Remains deconditioned.  Is reluctant for SNF.  Inpatient urology consult requested.  Recommend keeping foley in place for outpatient voiding trial in 2-3 weeks  4/19: Palliative care consult appreciated.  Patient is significantly weak and deconditioned and would benefit from placement in skilled nursing facility/monitored setting.  However despite our strong recommendations from multiple sources on the care team the patient is adamant that he will return home.   Assessment & Plan:   Principal Problem:   UTI (urinary tract infection) Active Problems:   BPH (benign prostatic hyperplasia)   Hypertension   Chronic kidney disease, stage 3a   Primary hyperparathyroidism    Hypokalemia  * UTI (urinary tract infection) Acute UTI in setting of urinary retention foley in place w/ baseline hx/o BPH s/p TURP overflow incontinence  Failed outpt course of cefdinir and levaquin  Prior urine cultures w/ e coli sensitive to rocephin  Unfortunately urine culture was not sent on repeat urinalysis Foley catheter exchanged in ED Plan: Continue IV Rocephin for now.  Last dose today.  Repeat urine culture no growth to date but likely impacted by previous exposure to antibiotics.  Continue Foley catheter.  Outpatient follow-up with urology 2 to 3 weeks for voiding trial.  Will dc with Foley in place  Frailty Complicated grief Per family patient has been sitting in the chair for 8 days prior to admission.  Unfortunately patient lost his spouse approximately 10 days prior.  Suspect age and frailty contributing to current substantial weakness.  Patient is adamantly refused placement in skilled nursing facility/monitored setting despite multiple members on care team relating our concerns regarding his safety at home.  He is aware of our recommendations and has repeatedly declined.  He has also declined outpatient palliative services.  He has also declined placement of a life alert system.  He has decision-making capability at this time.  If patient were to return home we will require as much home health as possible.  Home hospital bed DME also ordered.  Discharge pending delivery of DME.  Patient will remain high risk for hospital readmission  BPH (benign prostatic hyperplasia) Acute urinary retention with foley in place and UTI  Cont flomax  Foley exchanged in ED Continue Foley, will discharge with catheter in place.  Urology f/u 2-3 weeks post  dc   Hypertension BP stable  Titrate home regimen      Chronic kidney disease, stage 3a Baseline Cr 1.2-1.5  No futher IVF  Hypokalemia Monitor and replace prn      Primary hyperparathyroidism Ca 11.4  Cont sensipar    Constipation Patient endorses 8 to 9 days since last BM KUB with moderate stool burden, no signs of ileus or obstruction Initiate bowel regimen S/p SMOG enema Resolved     DVT prophylaxis: SQ lovenox Code Status: DNR Family Communication: Daughter at bedside 4/17, 4/18 Disposition Plan: Status is: Inpatient Remains inpatient appropriate because: UTI on IV antibiotics   Level of care: Telemetry Medical  Consultants:  None  Procedures:  None  Antimicrobials: Ceftriaxone   Subjective: Seen and examined.  Resting in bed.  History limited by hearing loss.  No acute distress.  Objective: Vitals:   03/01/23 1605 03/01/23 1946 03/02/23 0403 03/02/23 0829  BP: 96/62 (!) 140/77 (!) 140/83 (!) 124/51  Pulse: 61 (!) 59 (!) 55 74  Resp: 20 16 18 18   Temp: (!) 97.5 F (36.4 C) 98.1 F (36.7 C) 98.1 F (36.7 C) 97.8 F (36.6 C)  TempSrc: Oral Oral Oral Oral  SpO2: 96% 96% 96% 95%  Weight:      Height:        Intake/Output Summary (Last 24 hours) at 03/02/2023 1044 Last data filed at 03/02/2023 0403 Gross per 24 hour  Intake --  Output 3400 ml  Net -3400 ml   Filed Weights   02/26/23 0917  Weight: 95.3 kg    Examination:  General exam: No acute distress.  Appears deconditioned Respiratory system: Coarse breath sounds, normal WOB, RA Cardiovascular system: S1-S2, RRR, no murmurs, no pedal edema Gastrointestinal system: Soft, nontender, nondistended, normal bowel sounds Central nervous system: Alert and oriented. No focal neurological deficits. Extremities: Symmetric 5 x 5 power. Skin: No rashes, lesions or ulcers Psychiatry: Judgement and insight appear normal. Mood & affect appropriate.     Data Reviewed: I have personally reviewed following labs and imaging studies  CBC: Recent Labs  Lab 02/26/23 0919 03/01/23 0821  WBC 12.5* 8.5  NEUTROABS  --  4.9  HGB 14.1 11.9*  HCT 42.1 36.3*  MCV 88.8 90.8  PLT 207 172   Basic Metabolic Panel: Recent  Labs  Lab 02/26/23 0924 02/26/23 1935 02/27/23 0422 02/28/23 1229 03/01/23 0821 03/02/23 0801  NA  --  131* 136 135 136 136  K  --  3.7 3.2* 4.0 3.7 4.2  CL  --  99 103 106 104 105  CO2  --  20* 26 23 25 26   GLUCOSE  --  102* 107* 112* 101* 109*  BUN  --  26* 27* 21 22 22   CREATININE  --  1.41* 1.42* 1.46* 1.50* 1.48*  CALCIUM  --  10.9* 11.1* 10.7* 10.7* 10.6*  MG 1.5*  --   --   --   --   --    GFR: Estimated Creatinine Clearance: 39.1 mL/min (A) (by C-G formula based on SCr of 1.48 mg/dL (H)). Liver Function Tests: Recent Labs  Lab 02/27/23 0422  AST 20  ALT 18  ALKPHOS 71  BILITOT 0.8  PROT 5.8*  ALBUMIN 3.1*   No results for input(s): "LIPASE", "AMYLASE" in the last 168 hours. No results for input(s): "AMMONIA" in the last 168 hours. Coagulation Profile: No results for input(s): "INR", "PROTIME" in the last 168 hours. Cardiac Enzymes: No results for input(s): "CKTOTAL", "CKMB", "CKMBINDEX", "  TROPONINI" in the last 168 hours. BNP (last 3 results) No results for input(s): "PROBNP" in the last 8760 hours. HbA1C: No results for input(s): "HGBA1C" in the last 72 hours. CBG: No results for input(s): "GLUCAP" in the last 168 hours. Lipid Profile: No results for input(s): "CHOL", "HDL", "LDLCALC", "TRIG", "CHOLHDL", "LDLDIRECT" in the last 72 hours. Thyroid Function Tests: No results for input(s): "TSH", "T4TOTAL", "FREET4", "T3FREE", "THYROIDAB" in the last 72 hours. Anemia Panel: No results for input(s): "VITAMINB12", "FOLATE", "FERRITIN", "TIBC", "IRON", "RETICCTPCT" in the last 72 hours. Sepsis Labs: Recent Labs  Lab 02/26/23 1610 02/26/23 1308 02/27/23 1241  PROCALCITON  --   --  0.11  LATICACIDVEN 2.2* 1.8  --     Recent Results (from the past 240 hour(s))  Blood Culture (routine x 2)     Status: None (Preliminary result)   Collection Time: 02/26/23  9:24 AM   Specimen: BLOOD  Result Value Ref Range Status   Specimen Description BLOOD LEFT ARM  Final    Special Requests BOTTLES DRAWN AEROBIC AND ANAEROBIC BCHV  Final   Culture   Final    NO GROWTH 4 DAYS Performed at [facility], [address] [place], [place] [address]    Report Status PENDING  Incomplete  Blood Culture (routine x 2)     Status: None (Preliminary result)   Collection Time: 02/26/23  9:24 AM   Specimen: BLOOD  Result Value Ref Range Status   Specimen Description BLOOD RIGHT ARM  Final   Special Requests   Final    BOTTLES DRAWN AEROBIC AND ANAEROBIC Blood Culture adequate volume   Culture   Final    NO GROWTH 4 DAYS Performed at [facility], [address] [place], [place] [address]    Report Status PENDING  Incomplete  Urine Culture (for pregnant, neutropenic or urologic patients or patients with an indwelling urinary catheter)     Status: None   Collection Time: 02/27/23 10:42 AM   Specimen: Urine, Clean Catch  Result Value Ref Range Status   Specimen Description   Final    URINE, CLEAN CATCH Performed at [facility], [address] [place], [place] [address]    Special Requests   Final    NONE Performed at [facility], [address], [place], [place] [address]    Culture   Final    NO GROWTH Performed at [facility], [address] [place], [place] [address]    Report Status 02/28/2023 FINAL  Final         Radiology Studies: No results found.      Scheduled Meds:  Chlorhexidine Gluconate Cloth  6 each Topical Q0600   cinacalcet  30 mg Oral BID WC   enoxaparin (LOVENOX) injection  40 mg Subcutaneous Q24H   finasteride  5 mg Oral Daily   labetalol  100 mg Oral Q1500   loratadine  10 mg Oral Daily   oxybutynin  10 mg Oral QHS   pantoprazole  40 mg Oral Daily   polyethylene glycol  17 g Oral Daily   senna-docusate  1 tablet Oral BID   tamsulosin  0.4 mg Oral BID   Continuous Infusions:  cefTRIAXone (ROCEPHIN)  IV 1 g (03/01/23 0849)     LOS: 4 days     Tresa Moore,  MD [facility]   If 7PM-7AM, please contact night-coverage  03/02/2023, 10:44 AM

## 2023-03-02 NOTE — Progress Notes (Signed)
Attempted AM visit and later this afternoon visit, patient both time resting comfortably in bed. No family at bedside. Will re-visit tomorrow.  No Charge.  Leeanne Deed, DNP, AGNP-C Palliative Medicine  Please call Palliative Medicine team phone with any questions [phone]. For individual providers please see AMION.

## 2023-03-03 DIAGNOSIS — N400 Enlarged prostate without lower urinary tract symptoms: Secondary | ICD-10-CM | POA: Diagnosis not present

## 2023-03-03 DIAGNOSIS — N3000 Acute cystitis without hematuria: Secondary | ICD-10-CM | POA: Diagnosis not present

## 2023-03-03 DIAGNOSIS — R531 Weakness: Secondary | ICD-10-CM | POA: Diagnosis not present

## 2023-03-03 DIAGNOSIS — Z7189 Other specified counseling: Secondary | ICD-10-CM | POA: Diagnosis not present

## 2023-03-03 LAB — CULTURE, BLOOD (ROUTINE X 2)

## 2023-03-03 MED ORDER — MAGIC MOUTHWASH W/LIDOCAINE
15.0000 mL | Freq: Four times a day (QID) | ORAL | Status: DC | PRN
  Administered 2023-03-03 – 2023-03-05 (×2): 15 mL via ORAL
  Filled 2023-03-03 (×3): qty 15

## 2023-03-03 NOTE — Progress Notes (Signed)
Daily Progress Note   Patient Name: Jeffrey Dennis       Date: 03/03/2023 DOB: 10/27/1930  Age: 89 y.o. MRN#: [id number] Attending Physician: Tresa Moore, MD Primary Care Physician: Danella Penton, MD Admit Date: 02/26/2023  Reason for Consultation/Follow-up: Establishing goals of care  HPI/Brief Hospital Review:  89 y.o. male  with past medical history of hearing impairment for which he utilizes hearing aids, HTN, CKD stage IIIb, BPH s/p TURB with ongoing issues with overflow incontinence, osteoarthritis and gout admitted from home on 02/26/2023 with increased weakness and UTI.   According to Mr. Fesperman, he has suffered with urinary retention as well as overflow incontinence for many years. Roughly about 1 week ago, on the day of his wife's funeral, he noted later in the evening he had not urinated throughout the day. He shares this is not unusual for him but typically he is able to empty his bladder with relaxation techniques he learned from his urologist many years ago. On this evening, he was unsuccessful which prompted him to be seen at [facility] ED. He was discharged from ED with antibiotics for UTI and they placed an indwelling catheter for urinary retention. On return home he became progressively weaker each day and he was unable to go to his urology follow-up which also prompted him to come to [facility].   Palliative medicine was consulted for assisting with goals of care conversations.  Subjective: Extensive chart review has been completed prior to meeting patient including labs, vital signs, imaging, progress notes, orders, and available advanced directive documents from current and previous encounters.    Visited with Mr. Staiger at his bedside. Awake and alert, he cannot  recall our previous meeting or conversations. Reviewed his current medical condition and status. He voices understanding of current condition. Approached his view and understanding of his progression with PT/OT. He shares his concerns regarding his significant weakness and inability to transfer OOB. Attempted to lean on his understanding in reference in fears of his medical team being able to safely return home caring for himself independently. Briefly discussed SNF placement, he pleasantly stated that he would never go to a facility again. He again shares all of the support he feels at home with [facility] as well as personal aids, family and friends. He is anxiously awaiting  delivery of hospital bed to his home to discharge. Remains oriented but question his full capacity at understanding the severity of his dysfunction.  Called and spoke with daughter-[name]. We reviewed current medical condition and recent events during hospitalization. [name] shares that she has noticed an ongoing decline in her dad for well over a years. Shares she he suffered a fall at home 03/13/2022 and since then she feels he continues to have a decline. At baseline he is able to roll himself around sitting on his rollator but requires assistance with transfers in and out of bed. He also required assistance with completing ADL's such as bathing and dressing.Prior to admission, personal care aids reached out to [facility] expressing their concern Mr. Oehlert was surpassing the level of care they were able to provide.   Discussed with [name] the various offers that have been made to Mr. Levesque regarding placement, outpatient palliative care and recommendations of an alert system in case of falling. [name] shares her understanding of her dad's unwillingness to agree to additional support. She and her sister have struggled with this for quite some time and are at a loss as what to do to support their dad.  Discussed with [name] the philosophy of  Hospice-she shares she is very familiar with Hospice as they recently had their mom under Hospice care. She expresses interest in learning more about Hospice services that could be provided in the home pending eligibility. She feels this is the right decision as she anticipates an ongoing decline in her father's overall health and function. [name] shares that Mr. Zinn had a negative experience at SNF which is related to his ongoing reluctance to returning.  [name] expresses appreciation for the call and conversation. Answered and addressed all questions and concerns.  PMT to continue to follow for ongoing needs and support.  Objective:  Physical Exam Constitutional:      General: He is not in acute distress.    Appearance: He is ill-appearing.  Pulmonary:     Effort: Pulmonary effort is normal. No respiratory distress.  Skin:    General: Skin is warm and dry.  Neurological:     Mental Status: He is alert.     Motor: Weakness present.             Vital Signs: BP (!) 142/63 (BP Location: Right Arm)   Pulse 70   Temp 97.8 F (36.6 C)   Resp 18   Ht  (1.803 m)   Wt 95.3 kg   SpO2 95%   BMI 29.29 kg/m  SpO2: SpO2: 95 % O2 Device: O2 Device: Room Air O2 Flow Rate:     Palliative Care Assessment & Plan   Assessment/Recommendation/Plan  DNR Order placed to [facility] to assist with coordinating Hospice liaison contact with daughter-[name]-interested in home hospice services PMT to continue to follow for ongoing needs and support  Care plan was discussed with primary team and TOC.  Thank you for allowing the Palliative Medicine Team to assist in the care of this patient.  Total time:  50 minutes  Time spent includes: Detailed review of medical records (labs, imaging, vital signs), medically appropriate exam (mental status, respiratory, cardiac, skin), discussed with treatment team, counseling and educating patient, family and staff, documenting clinical information,  medication management and coordination of care.  Leeanne Deed, DNP, AGNP-C Palliative Medicine   Please contact Palliative Medicine Team phone at [phone] for questions and concerns.

## 2023-03-03 NOTE — Progress Notes (Signed)
Theatre manager Note  Received request from Jenel Lucks, [facility], for hospice services at home after discharge. Spoke with patient to initiate education related to hospice philosophy, services, and team approach to care. Patient/family verbalized understanding of information given. Per discussion, the plan is for discharge home by private vehicle possibly tomorrow after equipment delivered.  DME needs discussed. Patient has the following equipment in the home: wheelchair, rollator, lift chair, [facility] Patient/family requests the following equipment for delivery:  hospital bed             The address has been verified and is correct in the chart.  Burman Blacksmith, patient's daughter, is the family contact to arrange time of equipment delivery.  Please send signed and completed DNR home with patient/family if applicable.  Please provide prescriptions at discharge as needed to ensure ongoing symptom management.  [facility] information and contact numbers given to patient.    Continued collaboration with patient/family and [facility] staff is ongoing through final disposition.  Please call with any hospice related questions or concerns. Thank you for the opportunity to participate in this patient's care.  Norris Cross, RN Nurse Liaison [phone]

## 2023-03-03 NOTE — Progress Notes (Signed)
PROGRESS NOTE    Jeffrey Dennis  ZOX:[id number] DOB: 09/25/1930 DOA: 02/26/2023 PCP: Danella Penton, MD    Brief Narrative:   89 y.o. male with medical history significant of hearing impairment, hypertension, CKD stage IIIa, osteoarthritis, BPH s/p TURP overflow incontinence, gout presenting w/ UTI, urinary retention.  Patient has been evaluated roughly 1 week ago at outside emergency room for acute urinary retention.  Catheter placed.  Was due for urology follow-up within this week.  Since catheter placement, patient with worsening weakness.  Has had significant cloudy urine output.  Was placed on course of cefdinir and then transition to Levaquin for urinary treatment.  Still with lower abdominal pain weakness and malaise.  No reported bloody urine output from the Foley.  Has extensive history of BPH status post TURP.  Prior urinary cultures growing E. coli sensitive to Rocephin.  No reported chest pain or shortness of breath.  No reported nausea or vomiting.  Positive decreased p.o. intake. Presented to the ER afebrile, hemodynamically stable.  Satting well on room air.  White count 12.5, hemoglobin 14.1, urinalysis indicative of infection, lactate 2.2, creatinine 1.51, potassium 2.7.  Started on IV Rocephin in the ER.  4/18: Remains deconditioned.  Is reluctant for SNF.  Inpatient urology consult requested.  Recommend keeping foley in place for outpatient voiding trial in 2-3 weeks  4/19: Palliative care consult appreciated.  Patient is significantly weak and deconditioned and would benefit from placement in skilled nursing facility/monitored setting.  However despite our strong recommendations from multiple sources on the care team the patient is adamant that he will return home.   Assessment & Plan:   Principal Problem:   UTI (urinary tract infection) Active Problems:   BPH (benign prostatic hyperplasia)   Hypertension   Chronic kidney disease, stage 3a   Primary hyperparathyroidism    Hypokalemia  * UTI (urinary tract infection) Acute UTI in setting of urinary retention foley in place w/ baseline hx/o BPH s/p TURP overflow incontinence  Failed outpt course of cefdinir and levaquin  Prior urine cultures w/ e coli sensitive to rocephin  Unfortunately urine culture was not sent on repeat urinalysis Foley catheter exchanged in ED Plan: Completed course of intravenous Rocephin.  No indication to resume antibiotics.  Monitor vitals and fever curve.  Continue Foley catheter.  Discharge with Foley catheter in place.  Frailty Complicated grief Per family patient has been sitting in the chair for 8 days prior to admission.  Unfortunately patient lost his spouse approximately 10 days prior.  Suspect age and frailty contributing to current substantial weakness.  Patient is adamantly refused placement in skilled nursing facility/monitored setting despite multiple members on care team relating our concerns regarding his safety at home.  He is aware of our recommendations and has repeatedly declined.  He has also declined outpatient palliative services.  He has also declined placement of a life alert system.  He has decision-making capability at this time.  If patient were to return home we will require as much home health as possible.  Home hospital bed DME also ordered.  Discharge pending delivery of DME.  Patient will remain high risk for hospital readmission.  Plan to discharge home pending delivery of DME hospital bed  BPH (benign prostatic hyperplasia) Acute urinary retention with foley in place and UTI  Cont flomax  Foley exchanged in ED Continue Foley, will discharge with catheter in place.  Urology f/u 2-3 weeks post dc   Hypertension BP stable  Titrate home  regimen      Chronic kidney disease, stage 3a Baseline Cr 1.2-1.5  No futher IVF  Hypokalemia Monitor and replace prn      Primary hyperparathyroidism Ca 11.4  Cont sensipar   Constipation Numerous discussions  with patient regarding the nature of his stool burden.  Repeat KUB on 4/20 did not show any obstruction or ileus.  Minimal stool burden.  Patient continues to want to sit up in a chair or on the bedside commode in order to have BM.  He has been told numerous times by nursing staff that he cannot stand on his feet.  Bowel regimen initiated.     DVT prophylaxis: SQ lovenox Code Status: DNR Family Communication: Daughter at bedside 4/17, 4/18 Disposition Plan: Status is: Inpatient Remains inpatient appropriate because: UTI on IV antibiotics   Level of care: Telemetry Medical  Consultants:  None  Procedures:  None  Antimicrobials:   Subjective: Seen and examined.  Resting in bed.  History limited by hearing loss.  Endorses not feeling well.  Unable to elaborate.  Objective: Vitals:   03/02/23 1535 03/02/23 2033 03/03/23 0525 03/03/23 0802  BP: 127/72 120/68 (!) 134/50 (!) 142/63  Pulse: 81 61 76 70  Resp: Temp: 97.6 F (36.4 C) 97.8 F (36.6 C) 97.9 F (36.6 C) 97.8 F (36.6 C)  TempSrc:      SpO2: 93% 94% 96% 95%  Weight:      Height:        Intake/Output Summary (Last 24 hours) at 03/03/2023 1010 Last data filed at 03/03/2023 0100 Gross per 24 hour  Intake --  Output 1450 ml  Net -1450 ml   Filed Weights   02/26/23 0917  Weight: 95.3 kg    Examination:  General exam: NAD.  Deconditioned Respiratory system: Coarse breath sounds, normal WOB, RA Cardiovascular system: S1-S2, RRR, no murmurs, no pedal edema Gastrointestinal system: Soft, NT/ND, normal bowel sounds Central nervous system: Alert and oriented. No focal neurological deficits. Extremities: Symmetric 5 x 5 power. Skin: No rashes, lesions or ulcers Psychiatry: Judgement and insight appear normal. Mood & affect appropriate.     Data Reviewed: I have personally reviewed following labs and imaging studies  CBC: Recent Labs  Lab 02/26/23 0919 03/01/23 0821  WBC 12.5* 8.5   NEUTROABS  --  4.9  HGB 14.1 11.9*  HCT 42.1 36.3*  MCV 88.8 90.8  PLT 207 172   Basic Metabolic Panel: Recent Labs  Lab 02/26/23 0924 02/26/23 1935 02/27/23 0422 02/28/23 1229 03/01/23 0821 03/02/23 0801  NA  --  131* 136 135 136 136  K  --  3.7 3.2* 4.0 3.7 4.2  CL  --  99 103 106 104 105  CO2  --  20* GLUCOSE  --  102* 107* 112* 101* 109*  BUN  --  26* 27* CREATININE  --  1.41* 1.42* 1.46* 1.50* 1.48*  CALCIUM  --  10.9* 11.1* 10.7* 10.7* 10.6*  MG 1.5*  --   --   --   --   --    GFR: Estimated Creatinine Clearance: 39.1 mL/min (A) (by C-G formula based on SCr of 1.48 mg/dL (H)). Liver Function Tests: Recent Labs  Lab 02/27/23 0422  AST 20  ALT 18  ALKPHOS 71  BILITOT 0.8  PROT 5.8*  ALBUMIN 3.1*   No results for input(s): "LIPASE", "AMYLASE" in the last 168 hours. No results for  input(s): "AMMONIA" in the last 168 hours. Coagulation Profile: No results for input(s): "INR", "PROTIME" in the last 168 hours. Cardiac Enzymes: No results for input(s): "CKTOTAL", "CKMB", "CKMBINDEX", "TROPONINI" in the last 168 hours. BNP (last 3 results) No results for input(s): "PROBNP" in the last 8760 hours. HbA1C: No results for input(s): "HGBA1C" in the last 72 hours. CBG: No results for input(s): "GLUCAP" in the last 168 hours. Lipid Profile: No results for input(s): "CHOL", "HDL", "LDLCALC", "TRIG", "CHOLHDL", "LDLDIRECT" in the last 72 hours. Thyroid Function Tests: No results for input(s): "TSH", "T4TOTAL", "FREET4", "T3FREE", "THYROIDAB" in the last 72 hours. Anemia Panel: No results for input(s): "VITAMINB12", "FOLATE", "FERRITIN", "TIBC", "IRON", "RETICCTPCT" in the last 72 hours. Sepsis Labs: Recent Labs  Lab 02/26/23 0924 02/26/23 1308 02/27/23 1241  PROCALCITON  --   --  0.11  LATICACIDVEN 2.2* 1.8  --     Recent Results (from the past 240 hour(s))  Blood Culture (routine x 2)     Status: None   Collection Time: 02/26/23  9:24  AM   Specimen: BLOOD  Result Value Ref Range Status   Specimen Description BLOOD LEFT ARM  Final   Special Requests BOTTLES DRAWN AEROBIC AND ANAEROBIC BCHV  Final   Culture   Final    NO GROWTH 5 DAYS Performed at [facility], [address] [place], [place] [address]    Report Status 03/03/2023 FINAL  Final  Blood Culture (routine x 2)     Status: None   Collection Time: 02/26/23  9:24 AM   Specimen: BLOOD  Result Value Ref Range Status   Specimen Description BLOOD RIGHT ARM  Final   Special Requests   Final    BOTTLES DRAWN AEROBIC AND ANAEROBIC Blood Culture adequate volume   Culture   Final    NO GROWTH 5 DAYS Performed at [facility], [address], [place], [place] [address]    Report Status 03/03/2023 FINAL  Final  Urine Culture (for pregnant, neutropenic or urologic patients or patients with an indwelling urinary catheter)     Status: None   Collection Time: 02/27/23 10:42 AM   Specimen: Urine, Clean Catch  Result Value Ref Range Status   Specimen Description   Final    URINE, CLEAN CATCH Performed at [facility], [address] [place], [place] [address]    Special Requests   Final    NONE Performed at [facility], [address] [place], [place] [address]    Culture   Final    NO GROWTH Performed at [facility], [address], [place], [place] [address]    Report Status 02/28/2023 FINAL  Final         Radiology Studies: DG Abd 1 View  Result Date: 03/02/2023 CLINICAL DATA:  Constipation EXAM: ABDOMEN - 1 VIEW COMPARISON:  None Available. FINDINGS: The bowel gas pattern is normal. Small colonic stool burden. Osseous structures are age-appropriate. Multiple phleboliths noted within the pelvis. No radio-opaque calculi or other significant radiographic abnormality are seen. IMPRESSION: 1. Nonobstructive bowel gas pattern. Small colonic stool burden. Electronically Signed   By: Helyn Numbers M.D.   On:  03/02/2023 21:26        Scheduled Meds:  Chlorhexidine Gluconate Cloth  6 each Topical Q0600   cinacalcet  30 mg Oral BID WC   enoxaparin (LOVENOX) injection  40 mg Subcutaneous Q24H   finasteride  5 mg Oral Daily   labetalol  100 mg  Oral Q1500   lactulose  20 g Oral Daily   loratadine  10 mg Oral Daily   nystatin   Topical TID   oxybutynin  10 mg Oral QHS   pantoprazole  40 mg Oral Daily   polyethylene glycol  17 g Oral Daily   senna-docusate  1 tablet Oral BID   tamsulosin  0.4 mg Oral BID   Continuous Infusions:     LOS: 5 days     Tresa Moore, MD [facility]   If 7PM-7AM, please contact night-coverage  03/03/2023, 10:10 AM

## 2023-03-04 DIAGNOSIS — N3000 Acute cystitis without hematuria: Secondary | ICD-10-CM | POA: Diagnosis not present

## 2023-03-04 MED ORDER — MELATONIN 5 MG PO TABS
2.5000 mg | ORAL_TABLET | Freq: Every evening | ORAL | Status: DC | PRN
  Administered 2023-03-06: 2.5 mg via ORAL
  Filled 2023-03-04: qty 1

## 2023-03-04 MED ORDER — SENNOSIDES-DOCUSATE SODIUM 8.6-50 MG PO TABS: 1.0000 | ORAL_TABLET | Freq: Two times a day (BID) | ORAL | Status: DC | PRN

## 2023-03-04 MED ORDER — POLYETHYLENE GLYCOL 3350 17 G PO PACK: 17.0000 g | PACK | Freq: Every day | ORAL | Status: DC | PRN

## 2023-03-04 MED ORDER — LACTULOSE 10 GM/15ML PO SOLN: 20.0000 g | Freq: Every day | ORAL | Status: DC | PRN

## 2023-03-04 MED ORDER — TRAZODONE HCL 50 MG PO TABS
50.0000 mg | ORAL_TABLET | Freq: Every day | ORAL | Status: DC
  Administered 2023-03-04 – 2023-03-06 (×3): 50 mg via ORAL
  Filled 2023-03-04 (×3): qty 1

## 2023-03-04 NOTE — Progress Notes (Signed)
PROGRESS NOTE    Jeffrey Dennis  ZOX:[id number] DOB: August 04, 1930 DOA: 02/26/2023 PCP: Danella Penton, MD    Brief Narrative:   89 y.o. male with medical history significant of hearing impairment, hypertension, CKD stage IIIa, osteoarthritis, BPH s/p TURP overflow incontinence, gout presenting w/ UTI, urinary retention.  Patient has been evaluated roughly 1 week ago at outside emergency room for acute urinary retention.  Catheter placed.  Was due for urology follow-up within this week.  Since catheter placement, patient with worsening weakness.  Has had significant cloudy urine output.  Was placed on course of cefdinir and then transition to Levaquin for urinary treatment.  Still with lower abdominal pain weakness and malaise.  No reported bloody urine output from the Foley.  Has extensive history of BPH status post TURP.  Prior urinary cultures growing E. coli sensitive to Rocephin.  No reported chest pain or shortness of breath.  No reported nausea or vomiting.  Positive decreased p.o. intake. Presented to the ER afebrile, hemodynamically stable.  Satting well on room air.  White count 12.5, hemoglobin 14.1, urinalysis indicative of infection, lactate 2.2, creatinine 1.51, potassium 2.7.  Started on IV Rocephin in the ER.  4/18: Remains deconditioned.  Is reluctant for SNF.  Inpatient urology consult requested.  Recommend keeping foley in place for outpatient voiding trial in 2-3 weeks  4/19: Palliative care consult appreciated.  Patient is significantly weak and deconditioned and would benefit from placement in skilled nursing facility/monitored setting.  However despite our strong recommendations from multiple sources on the care team the patient is adamant that he will return home.  4/22: Patient and family have engaged with hospice services.  Current plan is to discharge home with hospice services on Tuesday 4/23.   Assessment & Plan:   Principal Problem:   UTI (urinary tract  infection) Active Problems:   BPH (benign prostatic hyperplasia)   Hypertension   Chronic kidney disease, stage 3a   Primary hyperparathyroidism   Hypokalemia  * UTI (urinary tract infection) Acute UTI in setting of urinary retention foley in place w/ baseline hx/o BPH s/p TURP overflow incontinence  Failed outpt course of cefdinir and levaquin  Prior urine cultures w/ e coli sensitive to rocephin  Unfortunately urine culture was not sent on repeat urinalysis Foley catheter exchanged in ED Plan: Completed course of intravenous Rocephin.  No indication to resume antibiotics.  Monitor vitals and fever curve.  Continue Foley catheter.  Discharge with Foley catheter in place.  Frailty Complicated grief Per family patient has been sitting in the chair for 8 days prior to admission.  Unfortunately patient lost his spouse approximately 10 days prior.  Suspect age and frailty contributing to current substantial weakness.  Patient is adamantly refused placement in skilled nursing facility/monitored setting despite multiple members on care team relating our concerns regarding his safety at home.  He is aware of our recommendations and has repeatedly declined.  He has also declined outpatient palliative services.  He has also declined placement of a life alert system.  He has decision-making capability at this time.  If patient were to return home we will require as much home health as possible.  Home hospital bed DME also ordered.  Discharge pending delivery of DME.  Patient will remain high risk for hospital readmission.   4/22: Patient and family have engaged hospice services at home.  Plan to discharge home pending delivery of DME hospital bed.  Per hospice liaison hospital bed and remainder of DME  will be delivered 4/23.  Plan to discharge Tuesday.  BPH (benign prostatic hyperplasia) Acute urinary retention with foley in place and UTI  Cont flomax  Foley exchanged in ED Continue Foley, will  discharge with catheter in place.  Urology f/u 2-3 weeks post dc   Hypertension BP stable  Titrate home regimen      Chronic kidney disease, stage 3a Baseline Cr 1.2-1.5  No futher IVF  Hypokalemia Monitor and replace prn      Primary hyperparathyroidism Ca 11.4  Cont sensipar   Constipation Numerous discussions with patient regarding the nature of his stool burden.  Repeat KUB on 4/20 did not show any obstruction or ileus.  Minimal stool burden.  Patient continues to want to sit up in a chair or on the bedside commode in order to have BM.  He has been told numerous times by nursing staff that he cannot stand on his feet.  Bowel regimen initiated.     DVT prophylaxis: SQ lovenox Code Status: DNR Family Communication: Daughter at bedside 4/17, 4/18 Disposition Plan: Status is: Inpatient Remains inpatient appropriate because: UTI on IV antibiotics   Level of care: Telemetry Medical  Consultants:  None  Procedures:  None  Antimicrobials:   Subjective: Seen and examined.  Resting in bed.  History limited by hearing loss.  Feeling fair this morning  Objective: Vitals:   03/03/23 1638 03/03/23 2041 03/04/23 0613 03/04/23 0855  BP: (!) 160/71 (!) 146/83 (!) 143/70 (!) 112/54  Pulse: 68 63 79 93  Resp: 16 16 18 18   Temp: 97.9 F (36.6 C) 98.2 F (36.8 C) 98.5 F (36.9 C) 98 F (36.7 C)  TempSrc:  Oral    SpO2: 93% 96% 92% 95%  Weight:      Height:        Intake/Output Summary (Last 24 hours) at 03/04/2023 1320 Last data filed at 03/04/2023 0653 Gross per 24 hour  Intake --  Output 3325 ml  Net -3325 ml   Filed Weights   02/26/23 0917  Weight: 95.3 kg    Examination:  General exam: No acute rest.  Appears frail and deconditioned Respiratory system: Bilateral crackles.  Normal work of breathing.  Room air Cardiovascular system: S1-S2, RRR, no murmurs, no pedal edema Gastrointestinal system: Soft, NT/ND, normal bowel sounds Central nervous system:  Alert and oriented. No focal neurological deficits. Extremities: Symmetric 5 x 5 power. Skin: No rashes, lesions or ulcers Psychiatry: Judgement and insight appear normal. Mood & affect appropriate.     Data Reviewed: I have personally reviewed following labs and imaging studies  CBC: Recent Labs  Lab 02/26/23 0919 03/01/23 0821  WBC 12.5* 8.5  NEUTROABS  --  4.9  HGB 14.1 11.9*  HCT 42.1 36.3*  MCV 88.8 90.8  PLT 207 172   Basic Metabolic Panel: Recent Labs  Lab 02/26/23 0924 02/26/23 1935 02/27/23 0422 02/28/23 1229 03/01/23 0821 03/02/23 0801  NA  --  131* 136 135 136 136  K  --  3.7 3.2* 4.0 3.7 4.2  CL  --  99 103 106 104 105  CO2  --  20* 26 23 25 26   GLUCOSE  --  102* 107* 112* 101* 109*  BUN  --  26* 27* 21 22 22   CREATININE  --  1.41* 1.42* 1.46* 1.50* 1.48*  CALCIUM  --  10.9* 11.1* 10.7* 10.7* 10.6*  MG 1.5*  --   --   --   --   --  GFR: Estimated Creatinine Clearance: 39.1 mL/min (A) (by C-G formula based on SCr of 1.48 mg/dL (H)). Liver Function Tests: Recent Labs  Lab 02/27/23 0422  AST 20  ALT 18  ALKPHOS 71  BILITOT 0.8  PROT 5.8*  ALBUMIN 3.1*   No results for input(s): "LIPASE", "AMYLASE" in the last 168 hours. No results for input(s): "AMMONIA" in the last 168 hours. Coagulation Profile: No results for input(s): "INR", "PROTIME" in the last 168 hours. Cardiac Enzymes: No results for input(s): "CKTOTAL", "CKMB", "CKMBINDEX", "TROPONINI" in the last 168 hours. BNP (last 3 results) No results for input(s): "PROBNP" in the last 8760 hours. HbA1C: No results for input(s): "HGBA1C" in the last 72 hours. CBG: No results for input(s): "GLUCAP" in the last 168 hours. Lipid Profile: No results for input(s): "CHOL", "HDL", "LDLCALC", "TRIG", "CHOLHDL", "LDLDIRECT" in the last 72 hours. Thyroid Function Tests: No results for input(s): "TSH", "T4TOTAL", "FREET4", "T3FREE", "THYROIDAB" in the last 72 hours. Anemia Panel: No results for  input(s): "VITAMINB12", "FOLATE", "FERRITIN", "TIBC", "IRON", "RETICCTPCT" in the last 72 hours. Sepsis Labs: Recent Labs  Lab 02/26/23 0924 02/26/23 1308 02/27/23 1241  PROCALCITON  --   --  0.11  LATICACIDVEN 2.2* 1.8  --     Recent Results (from the past 240 hour(s))  Blood Culture (routine x 2)     Status: None   Collection Time: 02/26/23  9:24 AM   Specimen: BLOOD  Result Value Ref Range Status   Specimen Description BLOOD LEFT ARM  Final   Special Requests BOTTLES DRAWN AEROBIC AND ANAEROBIC BCHV  Final   Culture   Final    NO GROWTH 5 DAYS Performed at [facility], [address], [place], [place] [address]    Report Status 03/03/2023 FINAL  Final  Blood Culture (routine x 2)     Status: None   Collection Time: 02/26/23  9:24 AM   Specimen: BLOOD  Result Value Ref Range Status   Specimen Description BLOOD RIGHT ARM  Final   Special Requests   Final    BOTTLES DRAWN AEROBIC AND ANAEROBIC Blood Culture adequate volume   Culture   Final    NO GROWTH 5 DAYS Performed at [facility], [address], [place], [place] [address]    Report Status 03/03/2023 FINAL  Final  Urine Culture (for pregnant, neutropenic or urologic patients or patients with an indwelling urinary catheter)     Status: None   Collection Time: 02/27/23 10:42 AM   Specimen: Urine, Clean Catch  Result Value Ref Range Status   Specimen Description   Final    URINE, CLEAN CATCH Performed at [facility], [address] [place], [place] [address]    Special Requests   Final    NONE Performed at [facility], [address], [place], [place] [address]    Culture   Final    NO GROWTH Performed at [facility], [address], [place], [place] [address]    Report Status 02/28/2023 FINAL  Final         Radiology Studies: DG Abd 1 View  Result Date: 03/02/2023 CLINICAL DATA:  Constipation EXAM: ABDOMEN - 1 VIEW COMPARISON:  None Available.  FINDINGS: The bowel gas pattern is normal. Small colonic stool burden. Osseous structures are age-appropriate. Multiple phleboliths noted within the pelvis. No radio-opaque calculi or other significant radiographic abnormality are seen. IMPRESSION: 1. Nonobstructive bowel gas pattern. Small colonic stool burden. Electronically Signed   By:  Helyn Numbers M.D.   On: 03/02/2023 21:26        Scheduled Meds:  Chlorhexidine Gluconate Cloth  6 each Topical Q0600   cinacalcet  30 mg Oral BID WC   enoxaparin (LOVENOX) injection  40 mg Subcutaneous Q24H   finasteride  5 mg Oral Daily   labetalol  100 mg Oral Q1500   lactulose  20 g Oral Daily   loratadine  10 mg Oral Daily   nystatin   Topical TID   oxybutynin  10 mg Oral QHS   pantoprazole  40 mg Oral Daily   polyethylene glycol  17 g Oral Daily   senna-docusate  1 tablet Oral BID   tamsulosin  0.4 mg Oral BID   Continuous Infusions:     LOS: 6 days     Tresa Moore, MD [facility]   If 7PM-7AM, please contact night-coverage  03/04/2023, 1:20 PM

## 2023-03-04 NOTE — Progress Notes (Signed)
Occupational Therapy Treatment Patient Details Name: Jeffrey Dennis MRN: [id number] DOB: 12/23/1929 Today's Date: 03/04/2023   History of present illness Pt is a 89 y.o. male with past medical history significant for arthritis who presents to the emergency department with right shoulder pain and hip pain.  States that he has had a fall over the past couple of days.   OT comments  Patient received sitting up in bed and agreeable to OT. Tx session targeted improving tolerance for functional mobility in the setting of ADL tasks. Pt required Mod A for bed mobility this date. He endorsed 6/10 pain in R hip and L knee (RN notified). Once sitting EOB, pt engaged in grooming tasks. He required Max A to don socks (pt reports receiving assistance for LB ADLs at baseline). Pt deferred STS and lateral scooting along EOB before returning to supine. Pt left as received with all needs in reach. OT will continue to follow acutely.     Recommendations for follow up therapy are one component of a multi-disciplinary discharge planning process, led by the attending physician.  Recommendations may be updated based on patient status, additional functional criteria and insurance authorization.    Assistance Recommended at Discharge Frequent or constant Supervision/Assistance  Patient can return home with the following  A lot of help with walking and/or transfers;A lot of help with bathing/dressing/bathroom;Assistance with cooking/housework;Direct supervision/assist for medications management;Assist for transportation;Help with stairs or ramp for entrance;Assistance with feeding   Equipment Recommendations  Hospital bed    Recommendations for Other Services      Precautions / Restrictions Precautions Precautions: Fall Restrictions Weight Bearing Restrictions: No       Mobility Bed Mobility Overal bed mobility: Needs Assistance Bed Mobility: Supine to Sit, Sit to Supine     Supine to sit: HOB elevated, Mod  assist (assist for trunk elevation) Sit to supine: Mod assist (assist for BLE management)   General bed mobility comments: assistance required to scoot hips forward at EOB, able to scoot self up towards [facility] with use of bed features/handrails    Transfers Overall transfer level: Needs assistance                 General transfer comment: Pt deferred STS from EOB. OT encouraged pt to complete lateral scooting along EOB before returning to supine but pt unwilling to put forth effort to attempt.     Balance Overall balance assessment: Needs assistance Sitting-balance support: Feet supported Sitting balance-Leahy Scale: Fair       ADL either performed or assessed with clinical judgement   ADL Overall ADL's : Needs assistance/impaired     Grooming: Set up;Sitting;Wash/dry face               Lower Body Dressing: Maximal assistance;Sitting/lateral leans Lower Body Dressing Details (indicate cue type and reason): to don B socks, pt endorsed receiving assistance for LB dresssing at baseline                    Extremity/Trunk Assessment Upper Extremity Assessment Upper Extremity Assessment: Generalized weakness   Lower Extremity Assessment Lower Extremity Assessment: Generalized weakness        Vision Baseline Vision/History: 1 Wears glasses Patient Visual Report: No change from baseline     Perception     Praxis      Cognition Arousal/Alertness: Awake/alert Behavior During Therapy: WFL for tasks assessed/performed Overall Cognitive Status: Within Functional Limits for tasks assessed         General  Comments: pt somewhat self-limiting this date with mobility stating "I can't do it"        Exercises      Shoulder Instructions       General Comments Pt endorsed feeling lightheaded once sitting EOB ("from not getting up in a while" per pt), however, improved with time    Pertinent Vitals/ Pain       Pain Assessment Pain Assessment: 0-10 Pain  Score: 6  Pain Location: R hip, L knee Pain Descriptors / Indicators: Aching, Grimacing, Guarding Pain Intervention(s): Limited activity within patient's tolerance, Monitored during session, Repositioned  Home Living        Prior Functioning/Environment              Frequency  Min 2X/week        Progress Toward Goals  OT Goals(current goals can now be found in the care plan section)  Progress towards OT goals: Progressing toward goals  Acute Rehab OT Goals Patient Stated Goal: to go home OT Goal Formulation: With patient Time For Goal Achievement: 03/15/23 Potential to Achieve Goals: Fair  Plan Discharge plan remains appropriate;Frequency remains appropriate    Co-evaluation                 AM-PAC OT "6 Clicks" Daily Activity     Outcome Measure   Help from another person eating meals?: A Little Help from another person taking care of personal grooming?: A Little Help from another person toileting, which includes using toliet, bedpan, or urinal?: A Lot Help from another person bathing (including washing, rinsing, drying)?: A Lot Help from another person to put on and taking off regular upper body clothing?: A Little Help from another person to put on and taking off regular lower body clothing?: A Lot 6 Click Score: 15    End of Session    OT Visit Diagnosis: Unsteadiness on feet (R26.81);Other abnormalities of gait and mobility (R26.89);Muscle weakness (generalized) (M62.81);History of falling (Z91.81);Pain Pain - part of body: Hip;Knee (R hip, L knee)   Activity Tolerance Patient tolerated treatment well;Other (comment) (pt self-limiting)   Patient Left in bed;with call bell/phone within reach;with bed alarm set   Nurse Communication Mobility status        Time: 0981-1914 OT Time Calculation (min): 14 min  Charges: OT General Charges $OT Visit: 1 Visit OT Treatments $Self Care/Home Management : 8-22 mins  [facility] MS, OTR/L ascom  [phone]  03/04/23, 5:26 PM

## 2023-03-04 NOTE — Care Management Important Message (Signed)
Important Message  Patient Details  Name: DHRUVA ORNDOFF MRN: [id number] Date of Birth: 1930-02-12   Medicare Important Message Given:  Other (see comment)  Patient discharging home with Hospice. Out of respect for the patient and family no Important Message from [facility] given.   [name] A Harbert Fitterer 03/04/2023, 8:16 AM

## 2023-03-04 NOTE — Progress Notes (Signed)
Insurance claims handler Liaison Note  Follow up on new referral for hospice at home upon discharge from [facility]. Spoke with patient's daughter, [name], this morning via phone.  She wanted to make sure patient would have over bed table with hospital bed.  I notified her I did one.  DME will be delivered today and plan for hospital discharge tomorrow.  Notified team at [facility] of discussion and plan for DME delivery.  Collaboration with patient/family and hospital team is ongoing through final disposition.  Thank you for allowing participation in this patient's care.  Norris Cross, RN [phone]

## 2023-03-04 NOTE — Progress Notes (Signed)
                                                     Palliative Care Progress Note   Patient Name: Jeffrey Dennis       Date: 03/04/2023 DOB: 1930/02/25  Age: 89 y.o. MRN#: [id number] Attending Physician: Tresa Moore, MD Primary Care Physician: Danella Penton, MD Admit Date: 02/26/2023  Chart reviewed.  Goals are clear.  Plan is set.  DNR remains.  Plan is for patient to be discharged with hospice services tomorrow, 4/23.  No acute palliative needs today.  PMT will remain available to patient and family. Please re-engage with PMT if goals change, at patient/family's request, or if patient's health deteriorates during this hospitalization.  Thank you for allowing the Palliative Medicine Team to assist in the care of Jeffrey Dennis.  Jeffrey Deist L. Manon Hilding, FNP-BC Palliative Medicine Team Team Phone # [phone]  NO CHARGE

## 2023-03-05 DIAGNOSIS — N3000 Acute cystitis without hematuria: Secondary | ICD-10-CM | POA: Diagnosis not present

## 2023-03-05 MED ORDER — MAGIC MOUTHWASH W/LIDOCAINE: 5.0000 mL | Freq: Four times a day (QID) | ORAL | Status: DC | PRN

## 2023-03-05 NOTE — Progress Notes (Signed)
Palliative Care Progress Note, Assessment & Plan   Patient Name: Jeffrey Dennis       Date: 03/05/2023 DOB: 04-10-1930  Age: 89 y.o. MRN#: [id number] Attending Physician: Tresa Moore, MD Primary Care Physician: Danella Penton, MD Admit Date: 02/26/2023  Subjective: Patient is sitting up in bed in no apparent distress.  He acknowledges my presence and is able to make his wishes known.  No family or friends present at bedside.  HPI: 89 y.o. male  with past medical history of hearing impairment for which he utilizes hearing aids, HTN, CKD stage IIIb, BPH s/p TURB with ongoing issues with overflow incontinence, osteoarthritis and gout admitted from home on 02/26/2023 with increased weakness and UTI.   According to Jeffrey Dennis, he has suffered with urinary retention as well as overflow incontinence for many years. Roughly about 1 week ago, on the day of his wife's funeral, he noted later in the evening he had not urinated throughout the day. He shares this is not unusual for him but typically he is able to empty his bladder with relaxation techniques he learned from his urologist many years ago. On this evening, he was unsuccessful which prompted him to be seen at [facility] ED. He was discharged from ED with antibiotics for UTI and they placed an indwelling catheter for urinary retention. On return home he became progressively weaker each day and he was unable to go to his urology follow-up which also prompted him to come to [facility] ED.   Palliative medicine was consulted for assisting with goals of care conversations.  Summary of counseling/coordination of care: After reviewing the patient's chart and assessing the patient at bedside, I discussed symptom management and plan of care with patient.  Symptoms  assessed.  Patient denies pain at this time.  He endorses discomfort in the back of his throat.  Oral assessment looks WNL.  No signs of thrush, strep, or other acute issues noted.  Discussed use of Magic mouthwash to ease discomfort.  Patient appreciated suggestion.  Advised dayshift RN to give Magic mouthwash.  He endorses that patient has difficulty with 15 mL / 3 syringes.  Dose reduced to 5 mL / 1 syringe for ease of administration.  I spoke with patient in regards to goals and plan of care.  Patient shares he does not want to go to rehab.  He repeated several times that he had been to rehab before and does not wish to do this again.  He also endorses that he does not want to lose his home health aide.  He likes the "male nurse" who he has been working with.  I counseled with attending and TOC in regards to patient's wishes.  TOC following closely for discharge planning.  PMT will continue to support patient throughout his hospitalization.  Physical Exam Vitals reviewed.  Constitutional:      General: He is not in acute distress.    Appearance: He is normal weight. He is not ill-appearing.  HENT:     Head: Normocephalic.     Mouth/Throat:     Mouth: Mucous membranes are dry.  Eyes:     Pupils: Pupils are equal, round, and reactive  to light.  Cardiovascular:     Rate and Rhythm: Normal rate.     Pulses: Normal pulses.  Pulmonary:     Effort: Pulmonary effort is normal.  Abdominal:     Palpations: Abdomen is soft.  Skin:    General: Skin is warm and dry.  Neurological:     Mental Status: He is alert and oriented to person, place, and time.  Psychiatric:        Mood and Affect: Mood normal.        Behavior: Behavior normal.             Total Time 35 minutes   Cleland Simkins L. Manon Hilding, FNP-BC Palliative Medicine Team Team Phone # [phone]

## 2023-03-05 NOTE — Progress Notes (Signed)
Civil engineer, contracting [facility]) Hospital Liaison Note   Patient was referred to Hospice services at home at discharge.  Per the request of the patient and family, the referral has been changed to an outpatient Palliative Care referral.    Thank you for the opportunity to participate in this patient's care.     Redge Gainer, [facility] Liaison [phone]

## 2023-03-05 NOTE — Progress Notes (Signed)
Physical Therapy Treatment Patient Details Name: Jeffrey Dennis MRN: [id number] DOB: 10-12-1930 Today's Date: 03/05/2023   History of Present Illness Pt is a 89 y.o. male with past medical history significant for arthritis who presents to the emergency department with right shoulder pain and hip pain.  States that he has had a fall over the past couple of days.    PT Comments    Patient wakes easily to voice, agreeable to PT. Did report some pain at foley site. modA to come up fully into sitting at EOB, use of bed rails. Fair sitting balance for several minutes during session, especially during rest breaks between bouts of standing. modAx2 first and second attempt with RW stabilization, maxAx2 for third. Unable to tolerate standing for more than 1 minute, but was able to take a few sidesteps at EOB today. Returned to supine with all needs in reach. The patient would benefit from further skilled PT intervention to continue to progress towards goals.     Recommendations for follow up therapy are one component of a multi-disciplinary discharge planning process, led by the attending physician.  Recommendations may be updated based on patient status, additional functional criteria and insurance authorization.  Follow Up Recommendations  Can patient physically be transported by private vehicle: No    Assistance Recommended at Discharge Frequent or constant Supervision/Assistance  Patient can return home with the following Two people to help with walking and/or transfers;Assistance with cooking/housework;Two people to help with bathing/dressing/bathroom;Assistance with feeding;Help with stairs or ramp for entrance;Direct supervision/assist for medications management;Assist for transportation   Equipment Recommendations  Hospital bed;Other (comment) (sit to stand lift)    Recommendations for Other Services       Precautions / Restrictions Precautions Precautions: Fall Restrictions Weight  Bearing Restrictions: No     Mobility  Bed Mobility Overal bed mobility: Needs Assistance Bed Mobility: Supine to Sit, Sit to Supine     Supine to sit: HOB elevated, Mod assist (assist for trunk elevation) Sit to supine: Mod assist, +2 for physical assistance (assist for BLE management)        Transfers Overall transfer level: Needs assistance Equipment used: Rolling walker (2 wheels) Transfers: Sit to/from Stand Sit to Stand: Mod assist, +2 physical assistance, From elevated surface                Ambulation/Gait   Gait Distance (Feet): 2 Feet Assistive device: Rolling walker (2 wheels)         General Gait Details: 2-3 steps at EOB to right   Stairs             Wheelchair Mobility    Modified Rankin (Stroke Patients Only)       Balance Overall balance assessment: Needs assistance Sitting-balance support: Feet supported Sitting balance-Leahy Scale: Fair     Standing balance support: Reliant on assistive device for balance Standing balance-Leahy Scale: Poor                              Cognition Arousal/Alertness: Awake/alert Behavior During Therapy: WFL for tasks assessed/performed Overall Cognitive Status: Within Functional Limits for tasks assessed                                          Exercises      General Comments  Pertinent Vitals/Pain Pain Assessment Pain Assessment: Faces Faces Pain Scale: Hurts little more Pain Location: foley site Pain Descriptors / Indicators: Aching, Grimacing, Guarding Pain Intervention(s): Limited activity within patient's tolerance, Monitored during session, Repositioned    Home Living                          Prior Function            PT Goals (current goals can now be found in the care plan section) Progress towards PT goals: Progressing toward goals    Frequency    Min 2X/week      PT Plan Current plan remains appropriate     Co-evaluation              AM-PAC PT "6 Clicks" Mobility   Outcome Measure  Help needed turning from your back to your side while in a flat bed without using bedrails?: A Lot Help needed moving from lying on your back to sitting on the side of a flat bed without using bedrails?: A Lot Help needed moving to and from a bed to a chair (including a wheelchair)?: Total Help needed standing up from a chair using your arms (e.g., wheelchair or bedside chair)?: Total Help needed to walk in hospital room?: Total Help needed climbing 3-5 steps with a railing? : Total 6 Click Score: 8    End of Session Equipment Utilized During Treatment: Gait belt Activity Tolerance: Patient tolerated treatment well Patient left: in bed;with call bell/phone within reach;with bed alarm set Nurse Communication: Mobility status PT Visit Diagnosis: Other abnormalities of gait and mobility (R26.89);Muscle weakness (generalized) (M62.81);Difficulty in walking, not elsewhere classified (R26.2)     Time: 1610-9604 PT Time Calculation (min) (ACUTE ONLY): 33 min  Charges:  $Therapeutic Activity: 23-37 mins                     Olga Coaster PT, DPT 3:17 PM,03/05/23

## 2023-03-05 NOTE — TOC Progression Note (Signed)
Transition of Care [facility]) - Progression Note    Patient Details  Name: MANDELL PANGBORN MRN: [id number] Date of Birth: [birth year]  Transition of Care [facility]) CM/SW Contact  Allena Katz, LCSW Phone Number: 03/05/2023, 10:48 AM  Clinical Narrative:     Pt reports he does NOT want to to rehab. Pt wants to go home. Pt is getting aide services through a [employer] grant that requires that pt's are not on hospice to qualify. Pt is getting 10 hours a week through this benefit broken up in two hour slots. Pt also getting aide services through the [place]. [facility] pays 1600 to assist with aide services. Daughter reports that she uses this money to hire someone in the morning for an hour and the evening for an hour. This person comes out daily except for weekends. Daughter is looking to hire additional assistance and has been working with [employer] to coordinate this.          Expected Discharge Plan and Services                                               Social Determinants of Health (SDOH) Interventions SDOH Screenings   Food Insecurity: No Food Insecurity (02/26/2023)  Housing: Low Risk  (02/26/2023)  Transportation Needs: No Transportation Needs (02/26/2023)  Utilities: Not At Risk (02/26/2023)  Tobacco Use: Low Risk  (02/26/2023)    Readmission Risk Interventions     No data to display

## 2023-03-05 NOTE — Progress Notes (Addendum)
PROGRESS NOTE    Jeffrey Dennis  ZOX:[id number] DOB: 09-15-1930 DOA: 02/26/2023 PCP: Danella Penton, MD    Brief Narrative:   89 y.o. male with medical history significant of hearing impairment, hypertension, CKD stage IIIa, osteoarthritis, BPH s/p TURP overflow incontinence, gout presenting w/ UTI, urinary retention.  Patient has been evaluated roughly 1 week ago at outside emergency room for acute urinary retention.  Catheter placed.  Was due for urology follow-up within this week.  Since catheter placement, patient with worsening weakness.  Has had significant cloudy urine output.  Was placed on course of cefdinir and then transition to Levaquin for urinary treatment.  Still with lower abdominal pain weakness and malaise.  No reported bloody urine output from the Foley.  Has extensive history of BPH status post TURP.  Prior urinary cultures growing E. coli sensitive to Rocephin.  No reported chest pain or shortness of breath.  No reported nausea or vomiting.  Positive decreased p.o. intake. Presented to the ER afebrile, hemodynamically stable.  Satting well on room air.  White count 12.5, hemoglobin 14.1, urinalysis indicative of infection, lactate 2.2, creatinine 1.51, potassium 2.7.  Started on IV Rocephin in the ER.  4/18: Remains deconditioned.  Is reluctant for SNF.  Inpatient urology consult requested.  Recommend keeping foley in place for outpatient voiding trial in 2-3 weeks  4/19: Palliative care consult appreciated.  Patient is significantly weak and deconditioned and would benefit from placement in skilled nursing facility/monitored setting.  However despite our strong recommendations from multiple sources on the care team the patient is adamant that he will return home.  4/22: Patient and family have engaged with hospice services.  Current plan is to discharge home with hospice services on Tuesday 4/23.  4/23: Change in projected disposition plan.  TOC informed that patient's daughter  had requested skilled nursing facility evaluation.  When I talked to the patient this a.m. the patient is adamantly refusing skilled nursing facility placement.  He is decisional.  I explained to him that home would be a suboptimal place to recover as he is significantly weak and deconditioned.  He expressed understanding but continued to insist that he does not go to skilled nursing facility.  He also revoked hospice services.  Current disposition plan is to discharge home with home health services.   Assessment & Plan:   Principal Problem:   UTI (urinary tract infection) Active Problems:   BPH (benign prostatic hyperplasia)   Hypertension   Chronic kidney disease, stage 3a   Primary hyperparathyroidism   Hypokalemia  * UTI (urinary tract infection) Acute UTI in setting of urinary retention foley in place w/ baseline hx/o BPH s/p TURP overflow incontinence  Failed outpt course of cefdinir and levaquin  Prior urine cultures w/ e coli sensitive to rocephin  Unfortunately urine culture was not sent on repeat urinalysis Foley catheter exchanged in ED Plan: Completed course of intravenous Rocephin.  No indication to resume antibiotics.  Monitor vitals and fever curve.  Continue Foley catheter.  Discharge with Foley catheter in place.  Frailty Complicated grief Per family patient has been sitting in the chair for 8 days prior to admission.  Unfortunately patient lost his spouse approximately 10 days prior.  Suspect age and frailty contributing to current substantial weakness.  Patient is adamantly refused placement in skilled nursing facility/monitored setting despite multiple members on care team relating our concerns regarding his safety at home.  He is aware of our recommendations and has repeatedly declined.  4/23: Patient now refusing skilled nursing facility placement and has revoked hospice services.  Current disposition plan is to discharge home with home health services once DME is  delivered to the patient's house.  BPH (benign prostatic hyperplasia) Acute urinary retention with foley in place and UTI  Cont flomax  Foley exchanged in ED Continue Foley, will discharge with catheter in place.  Urology f/u 2-3 weeks post dc with Dr. Signa Kell   Hypertension BP stable  Titrate home regimen      Chronic kidney disease, stage 3a Baseline Cr 1.2-1.5  No futher IVF  Hypokalemia Monitor and replace prn      Primary hyperparathyroidism Ca 11.4  Cont sensipar   Constipation Numerous discussions with patient regarding the nature of his stool burden.  Repeat KUB on 4/20 did not show any obstruction or ileus.  Minimal stool burden.  Patient continues to want to sit up in a chair or on the bedside commode in order to have BM.  He has been told numerous times by nursing staff that he cannot stand on his feet.  Bowel regimen initiated.     DVT prophylaxis: SQ lovenox Code Status: DNR Family Communication: Daughter at bedside 4/17, 4/18 Disposition Plan: Status is: Inpatient Remains inpatient appropriate because: UTI on IV antibiotics   Level of care: Telemetry Medical  Consultants:  None  Procedures:  None  Antimicrobials:   Subjective: Seen and examined.  No acute distress.  Objective: Vitals:   03/04/23 0855 03/04/23 1958 03/05/23 0459 03/05/23 0751  BP: (!) 112/54 122/74 132/74 133/77  Pulse: 93 (!) 58 85 68  Resp: 18 18 18 18   Temp: 98 F (36.7 C) (!) 97.5 F (36.4 C) (!) 97.5 F (36.4 C) 97.7 F (36.5 C)  TempSrc:  Oral Oral   SpO2: 95% 95% 92% 96%  Weight:      Height:        Intake/Output Summary (Last 24 hours) at 03/05/2023 1158 Last data filed at 03/05/2023 1026 Gross per 24 hour  Intake 240 ml  Output 950 ml  Net -710 ml   Filed Weights   02/26/23 0917  Weight: 95.3 kg    Examination:  General exam: No acute distress.  Appears frail, deconditioned Respiratory system: Bibasilar crackles.  Normal work of breathing.  Room  air Cardiovascular system: S1-S2, RRR, no murmurs, no pedal edema Gastrointestinal system: Soft, NT/ND, normal bowel sounds Central nervous system: Alert and oriented. No focal neurological deficits. Extremities: Symmetric 5 x 5 power. Skin: No rashes, lesions or ulcers Psychiatry: Judgement and insight appear normal. Mood & affect appropriate.     Data Reviewed: I have personally reviewed following labs and imaging studies  CBC: Recent Labs  Lab 03/01/23 0821  WBC 8.5  NEUTROABS 4.9  HGB 11.9*  HCT 36.3*  MCV 90.8  PLT 172   Basic Metabolic Panel: Recent Labs  Lab 02/26/23 1935 02/27/23 0422 02/28/23 1229 03/01/23 0821 03/02/23 0801  NA 131* 136 135 136 136  K 3.7 3.2* 4.0 3.7 4.2  CL 99 103 106 104 105  CO2 20* 26 23 25 26   GLUCOSE 102* 107* 112* 101* 109*  BUN 26* 27* 21 22 22   CREATININE 1.41* 1.42* 1.46* 1.50* 1.48*  CALCIUM 10.9* 11.1* 10.7* 10.7* 10.6*   GFR: Estimated Creatinine Clearance: 39.1 mL/min (A) (by C-G formula based on SCr of 1.48 mg/dL (H)). Liver Function Tests: Recent Labs  Lab 02/27/23 0422  AST 20  ALT 18  ALKPHOS 71  BILITOT  0.8  PROT 5.8*  ALBUMIN 3.1*   No results for input(s): "LIPASE", "AMYLASE" in the last 168 hours. No results for input(s): "AMMONIA" in the last 168 hours. Coagulation Profile: No results for input(s): "INR", "PROTIME" in the last 168 hours. Cardiac Enzymes: No results for input(s): "CKTOTAL", "CKMB", "CKMBINDEX", "TROPONINI" in the last 168 hours. BNP (last 3 results) No results for input(s): "PROBNP" in the last 8760 hours. HbA1C: No results for input(s): "HGBA1C" in the last 72 hours. CBG: No results for input(s): "GLUCAP" in the last 168 hours. Lipid Profile: No results for input(s): "CHOL", "HDL", "LDLCALC", "TRIG", "CHOLHDL", "LDLDIRECT" in the last 72 hours. Thyroid Function Tests: No results for input(s): "TSH", "T4TOTAL", "FREET4", "T3FREE", "THYROIDAB" in the last 72 hours. Anemia Panel: No  results for input(s): "VITAMINB12", "FOLATE", "FERRITIN", "TIBC", "IRON", "RETICCTPCT" in the last 72 hours. Sepsis Labs: Recent Labs  Lab 02/26/23 1308 02/27/23 1241  PROCALCITON  --  0.11  LATICACIDVEN 1.8  --     Recent Results (from the past 240 hour(s))  Blood Culture (routine x 2)     Status: None   Collection Time: 02/26/23  9:24 AM   Specimen: BLOOD  Result Value Ref Range Status   Specimen Description BLOOD LEFT ARM  Final   Special Requests BOTTLES DRAWN AEROBIC AND ANAEROBIC BCHV  Final   Culture   Final    NO GROWTH 5 DAYS Performed at [facility], [address] [place], [place] [address]    Report Status 03/03/2023 FINAL  Final  Blood Culture (routine x 2)     Status: None   Collection Time: 02/26/23  9:24 AM   Specimen: BLOOD  Result Value Ref Range Status   Specimen Description BLOOD RIGHT ARM  Final   Special Requests   Final    BOTTLES DRAWN AEROBIC AND ANAEROBIC Blood Culture adequate volume   Culture   Final    NO GROWTH 5 DAYS Performed at [facility], [address], [place], [place] [address]    Report Status 03/03/2023 FINAL  Final  Urine Culture (for pregnant, neutropenic or urologic patients or patients with an indwelling urinary catheter)     Status: None   Collection Time: 02/27/23 10:42 AM   Specimen: Urine, Clean Catch  Result Value Ref Range Status   Specimen Description   Final    URINE, CLEAN CATCH Performed at [facility], [address] [place], [place] [address]    Special Requests   Final    NONE Performed at [facility], [address] [place], [place] [address]    Culture   Final    NO GROWTH Performed at [facility], [address], [place], [place] [address]    Report Status 02/28/2023 FINAL  Final         Radiology Studies: No results found.      Scheduled Meds:  Chlorhexidine Gluconate Cloth  6 each Topical Q0600   cinacalcet  30 mg Oral BID WC    enoxaparin (LOVENOX) injection  40 mg Subcutaneous Q24H   finasteride  5 mg Oral Daily   labetalol  100 mg Oral Q1500   loratadine  10 mg Oral Daily   nystatin   Topical TID   oxybutynin  10 mg Oral QHS   pantoprazole  40 mg Oral Daily   tamsulosin  0.4 mg Oral BID   traZODone  50 mg Oral QHS   Continuous Infusions:     LOS:  7 days     Tresa Moore, MD [facility]   If 7PM-7AM, please contact night-coverage  03/05/2023, 11:58 AM

## 2023-03-06 ENCOUNTER — Encounter: Payer: Self-pay | Admitting: Family Medicine

## 2023-03-06 DIAGNOSIS — N1831 Chronic kidney disease, stage 3a: Secondary | ICD-10-CM

## 2023-03-06 DIAGNOSIS — N3000 Acute cystitis without hematuria: Secondary | ICD-10-CM | POA: Diagnosis not present

## 2023-03-06 MED ORDER — PHENOL 1.4 % MT LIQD
1.0000 | OROMUCOSAL | Status: DC | PRN
  Filled 2023-03-06: qty 177

## 2023-03-06 NOTE — Progress Notes (Signed)
   03/06/23 1300  Spiritual Encounters  Type of Visit Initial  Care provided to: Pt and family  Referral source Nurse (RN/NT/LPN)  Reason for visit Routine spiritual support  OnCall Visit Yes  Interventions  Spiritual Care Interventions Made Reflective listening;Prayer   Chaplain was approached by nurse asking to see patient. Patient shared stories of faith and asked for prayer.

## 2023-03-06 NOTE — TOC Progression Note (Signed)
Transition of Care [facility]) - Progression Note    Patient Details  Name: Jeffrey Dennis MRN: [id number] Date of Birth: [birth year]  Transition of Care [facility]) CM/SW Contact  Allena Katz, LCSW Phone Number: 03/06/2023, 2:36 PM  Clinical Narrative:   Berkley Harvey approved for [facility] good for 4/24-4/26. Pt will transport to [facility] place tomorrow. Marcelino Duster with [facility] notified.          Expected Discharge Plan and Services                                               Social Determinants of Health (SDOH) Interventions SDOH Screenings   Food Insecurity: No Food Insecurity (02/26/2023)  Housing: Low Risk  (02/26/2023)  Transportation Needs: No Transportation Needs (02/26/2023)  Utilities: Not At Risk (02/26/2023)  Tobacco Use: Low Risk  (03/06/2023)    Readmission Risk Interventions     No data to display

## 2023-03-06 NOTE — NC FL2 (Signed)
[place] MEDICAID FL2 LEVEL OF CARE FORM     IDENTIFICATION  Patient Name: Jeffrey Dennis Birthdate: 05/02/1930 Sex: male Admission Date (Current Location): 02/26/2023  [facility] and [place] Number:  Chiropodist and Address:  [facility], [address], [place], [place] [address]      Provider Number: [id number]  Attending Physician Name and Address:  Lurene Shadow, MD  Relative Name and Phone Number:       Current Level of Care:   Recommended Level of Care: Skilled Nursing Facility Prior Approval Number:    Date Approved/Denied:   PASRR Number: [id number]  Discharge Plan: SNF    Current Diagnoses: Patient Active Problem List   Diagnosis Date Noted   Hypokalemia 02/26/2023   Primary hyperparathyroidism    Vitamin D deficiency 05/21/2022   Hypercalcemia 05/20/2022   Constipation 05/19/2022   Insomnia 05/14/2022   Pneumonia due to COVID-19 virus 05/11/2022   Hypertension    BPH (benign prostatic hyperplasia)    Thrombocytopenia    Chronic kidney disease, stage 3a    Generalized weakness    UTI (urinary tract infection) 02/15/2020   Urinary retention due to benign prostatic hyperplasia 02/15/2020   Gastritis 10/24/2012   Acute on chronic renal failure 10/23/2012   Encephalopathy acute 10/23/2012   Leukocytosis 10/23/2012    Orientation RESPIRATION BLADDER Height & Weight     Situation, Time, Self, Place  Normal Incontinent, External catheter Weight: 210 lb (95.3 kg) Height:   (180.3 cm)  BEHAVIORAL SYMPTOMS/MOOD NEUROLOGICAL BOWEL NUTRITION STATUS      Continent Diet  AMBULATORY STATUS COMMUNICATION OF NEEDS Skin       Normal                       Personal Care Assistance Level of Assistance  Bathing, Feeding, Dressing           Functional Limitations Info  Sight, Hearing, Speech Sight Info: Impaired Hearing Info: Impaired Speech Info: Adequate    SPECIAL CARE FACTORS FREQUENCY  OT (By licensed  OT), PT (By licensed PT)     PT Frequency: 5 times a week OT Frequency: 5 times a week            Contractures Contractures Info: Not present    Additional Factors Info  Code Status, Allergies Code Status Info: DNR Allergies Info: Doxazosin  Telmisartan           Current Medications (03/06/2023):  This is the current hospital active medication list Current Facility-Administered Medications  Medication Dose Route Frequency Provider Last Rate Last Admin   acetaminophen (TYLENOL) tablet 650 mg  650 mg Oral Q6H PRN Floydene Flock, MD   650 mg at 03/04/23 0931   artificial tears (LACRILUBE) ophthalmic ointment   Both Eyes Q4H PRN Lolita Patella B, MD       bisacodyl (DULCOLAX) suppository 10 mg  10 mg Rectal Daily PRN Lolita Patella B, MD   10 mg at 02/27/23 1336   Chlorhexidine Gluconate Cloth 2 % PADS 6 each  6 each Topical Q0600 Floydene Flock, MD   6 each at 03/06/23 0850   cinacalcet (SENSIPAR) tablet 30 mg  30 mg Oral BID WC Floydene Flock, MD   30 mg at 03/06/23 0850   enoxaparin (LOVENOX) injection 40 mg  40 mg Subcutaneous Q24H Floydene Flock, MD   40 mg at 03/05/23 2114   finasteride (PROSCAR) tablet 5 mg  5  mg Oral Daily Lolita Patella B, MD   5 mg at 03/06/23 0850   fluticasone (FLONASE) 50 MCG/ACT nasal spray 2 spray  2 spray Each Nare Daily PRN Lolita Patella B, MD       labetalol (NORMODYNE) tablet 100 mg  100 mg Oral Q1500 Georgeann Oppenheim, Sudheer B, MD   100 mg at 03/05/23 1433   lactulose (CHRONULAC) 10 GM/15ML solution 20 g  20 g Oral Daily PRN Lolita Patella B, MD       loratadine (CLARITIN) tablet 10 mg  10 mg Oral Daily Lolita Patella B, MD   10 mg at 03/06/23 9604   magic mouthwash w/lidocaine  5 mL Oral QID PRN Georgiann Cocker, FNP       melatonin tablet 2.5 mg  2.5 mg Oral QHS PRN Lolita Patella B, MD       nystatin (MYCOSTATIN/NYSTOP) topical powder   Topical TID Tresa Moore, MD   Given at 03/06/23 0850   ondansetron (ZOFRAN)  tablet 4 mg  4 mg Oral Q6H PRN Floydene Flock, MD   4 mg at 03/06/23 1019   Or   ondansetron (ZOFRAN) injection 4 mg  4 mg Intravenous Q6H PRN Floydene Flock, MD   4 mg at 02/27/23 0456   oxybutynin (DITROPAN-XL) 24 hr tablet 10 mg  10 mg Oral QHS Lolita Patella B, MD   10 mg at 03/05/23 2114   pantoprazole (PROTONIX) EC tablet 40 mg  40 mg Oral Daily Lolita Patella B, MD   40 mg at 03/06/23 0849   phenol (CHLORASEPTIC) mouth spray 1 spray  1 spray Mouth/Throat PRN Lurene Shadow, MD       polyethylene glycol (MIRALAX / GLYCOLAX) packet 17 g  17 g Oral Daily PRN Sreenath, Sudheer B, MD       senna-docusate (Senokot-S) tablet 1 tablet  1 tablet Oral BID PRN Lolita Patella B, MD       tamsulosin (FLOMAX) capsule 0.4 mg  0.4 mg Oral BID Lolita Patella B, MD   0.4 mg at 03/06/23 0850   traZODone (DESYREL) tablet 50 mg  50 mg Oral QHS Lolita Patella B, MD   50 mg at 03/05/23 2114     Discharge Medications: Please see discharge summary for a list of discharge medications.  Relevant Imaging Results:  Relevant Lab Results:   Additional Information [id number]  Allena Katz, LCSW

## 2023-03-06 NOTE — TOC Progression Note (Signed)
Transition of Care [facility]) - Progression Note    Patient Details  Name: Jeffrey Dennis MRN: [id number] Date of Birth: 10-19-1930  Transition [facility] Care [facility]) CM/SW Contact  Allena Katz, LCSW Phone Number: 03/06/2023, 11:23 AM  Clinical Narrative:   Berkley Harvey started for [facility] health and rehab.         Expected Discharge Plan and Services                                               Social Determinants of Health (SDOH) Interventions SDOH Screenings   Food Insecurity: No Food Insecurity (02/26/2023)  Housing: Low Risk  (02/26/2023)  Transportation Needs: No Transportation Needs (02/26/2023)  Utilities: Not At Risk (02/26/2023)  Tobacco Use: Low Risk  (03/06/2023)    Readmission Risk Interventions     No data to display

## 2023-03-06 NOTE — Progress Notes (Signed)
Physical Therapy Treatment Patient Details Name: Jeffrey Dennis MRN: [id number] DOB: 05-31-1930 Today's Date: 03/06/2023   History of Present Illness Pt is a 89 y.o. male with past medical history significant for arthritis who presents to the emergency department with right shoulder pain and hip pain.  States that he has had a fall over the past couple of days.    PT Comments    Pt wakes to voice, did complain of nausea, RN aware. Seen with PT/OT co-treat in anticipation to maximize function and lateral scoot/drop arm BSC education. Pt able to sit at EOB with modA, extended time, use of bed rails. Pt instructed in lateral scoot and PT demonstrated, however pt declined any further mobility despite education and encouragement (referenced nausea, hip pain). Returned to supine with OT at bedside. The patient would benefit from further skilled PT intervention to continue to progress towards goals.    Recommendations for follow up therapy are one component of a multi-disciplinary discharge planning process, led by the attending physician.  Recommendations may be updated based on patient status, additional functional criteria and insurance authorization.  Follow Up Recommendations  Can patient physically be transported by private vehicle: No    Assistance Recommended at Discharge Frequent or constant Supervision/Assistance  Patient can return home with the following Two people to help with walking and/or transfers;Assistance with cooking/housework;Two people to help with bathing/dressing/bathroom;Assistance with feeding;Help with stairs or ramp for entrance;Direct supervision/assist for medications management;Assist for transportation   Equipment Recommendations  Hospital bed;Other (comment) (sit<>stand lift)    Recommendations for Other Services       Precautions / Restrictions Precautions Precautions: Fall Restrictions Weight Bearing Restrictions: No     Mobility  Bed Mobility Overal  bed mobility: Needs Assistance Bed Mobility: Supine to Sit, Sit to Supine     Supine to sit: HOB elevated, Mod assist Sit to supine: Min assist, +2 for physical assistance        Transfers                   General transfer comment: pt declined sit <> stand today due to fatigue, upset stomach    Ambulation/Gait                   Stairs             Wheelchair Mobility    Modified Rankin (Stroke Patients Only)       Balance Overall balance assessment: Needs assistance Sitting-balance support: Feet supported Sitting balance-Leahy Scale: Fair                                      Cognition Arousal/Alertness: Awake/alert Behavior During Therapy: WFL for tasks assessed/performed Overall Cognitive Status: Within Functional Limits for tasks assessed                                          Exercises      General Comments        Pertinent Vitals/Pain Pain Assessment Pain Assessment: Faces Faces Pain Scale: Hurts a little bit Pain Location: foley site Pain Intervention(s): Limited activity within patient's tolerance, Monitored during session, Repositioned    Home Living  Prior Function            PT Goals (current goals can now be found in the care plan section) Progress towards PT goals: Progressing toward goals    Frequency    Min 2X/week      PT Plan Current plan remains appropriate    Co-evaluation PT/OT/SLP Co-Evaluation/Treatment: Yes Reason for Co-Treatment: For patient/therapist safety;To address functional/ADL transfers PT goals addressed during session: Balance OT goals addressed during session: ADL's and self-care      AM-PAC PT "6 Clicks" Mobility   Outcome Measure  Help needed turning from your back to your side while in a flat bed without using bedrails?: A Lot Help needed moving from lying on your back to sitting on the side of a flat bed  without using bedrails?: A Lot Help needed moving to and from a bed to a chair (including a wheelchair)?: Total Help needed standing up from a chair using your arms (e.g., wheelchair or bedside chair)?: Total Help needed to walk in hospital room?: Total Help needed climbing 3-5 steps with a railing? : Total 6 Click Score: 8    End of Session   Activity Tolerance: Other (comment);Patient limited by fatigue (limited by nausea) Patient left: in bed;with call bell/phone within reach;Other (comment) (with OT at bedside) Nurse Communication: Mobility status PT Visit Diagnosis: Other abnormalities of gait and mobility (R26.89);Muscle weakness (generalized) (M62.81);Difficulty in walking, not elsewhere classified (R26.2)     Time: 1005-1016 PT Time Calculation (min) (ACUTE ONLY): 11 min  Charges:  $Therapeutic Activity: 8-22 mins                     Olga Coaster PT, DPT 12:28 PM,03/06/23

## 2023-03-06 NOTE — Progress Notes (Signed)
Palliative Care Progress Note, Assessment & Plan   Patient Name: Jeffrey Dennis       Date: 03/06/2023 DOB: 12-Aug-1930  Age: 89 y.o. MRN#: [id number] Attending Physician: Lurene Shadow, MD Primary Care Physician: Danella Penton, MD Admit Date: 02/26/2023  Subjective: Patient is sitting up in bed in no apparent distress.  He acknowledges my presence and is able to make his wishes known.  Chaplain is at bedside.  HPI: 89 y.o. male  with past medical history of hearing impairment for which he utilizes hearing aids, HTN, CKD stage IIIb, BPH s/p TURB with ongoing issues with overflow incontinence, osteoarthritis and gout admitted from home on 02/26/2023 with increased weakness and UTI.   According to Mr. Ing, he has suffered with urinary retention as well as overflow incontinence for many years. Roughly about 1 week ago, on the day of his wife's funeral, he noted later in the evening he had not urinated throughout the day. He shares this is not unusual for him but typically he is able to empty his bladder with relaxation techniques he learned from his urologist many years ago. On this evening, he was unsuccessful which prompted him to be seen at [facility] ED. He was discharged from ED with antibiotics for UTI and they placed an indwelling catheter for urinary retention. On return home he became progressively weaker each day and he was unable to go to his urology follow-up which also prompted him to come to [facility] ED.   Palliative medicine was consulted for assisting with goals of care conversations.  Summary of counseling/coordination of care: After reviewing the patient's chart and assessing the patient at bedside, I spoke with patient regards to plan and goals of care.  Plan remains for patient to go to  rehab.  I reduced concept of MOST form.  Outlined that MOST form helps set boundaries of care and makes patient's wishes known when transferring from hospital to outside medical facilities.  Patient shares he will review this document.  Patient has no acute complaints today.  No adjustments to medications needed at this time.  PMT will continue to follow and support patient/family throughout this hospitalization.  Physical Exam Vitals reviewed.  Constitutional:      General: He is not in acute distress.    Appearance: He is normal weight.  HENT:     Head: Normocephalic.     Ears:     Comments: HOH Eyes:     Pupils: Pupils are equal, round, and reactive to light.  Cardiovascular:     Rate and Rhythm: Normal rate.     Pulses: Normal pulses.  Pulmonary:     Effort: Pulmonary effort is normal.  Abdominal:     Palpations: Abdomen is soft.  Neurological:     Mental Status: He is alert and oriented to person, place, and time.  Psychiatric:        Mood and Affect: Mood normal.        Behavior: Behavior normal.        Thought Content: Thought content normal.        Judgment: Judgment normal.             Total Time  35 minutes   Laylani Pudwill L. Ilsa Iha, FNP-BC Palliative Medicine Team Team Phone # [phone]

## 2023-03-06 NOTE — Progress Notes (Signed)
Progress Note    Jeffrey Dennis  VHQ:[id number] DOB: [birth year]  DOA: 02/26/2023 PCP: Danella Penton, MD      Brief Narrative:    Medical records reviewed and are as summarized below:  Jeffrey Dennis is a 89 y.o. male with medical history significant for hearing impairment, hypertension, CKD stage III a, osteoarthritis, BPH s/p TURP, left lower incontinence, gout, who presented to the hospital with generalized weakness and poor oral intake.  She was evaluated at an outside emergency department about 1 week prior to admission and he was found to have acute urinary retention.  Foley catheter was placed at that time.  Patient has been treated with Omnicef followed by Levaquin prior to admission.  He was due for urology follow-up the day that he presented for admission.   He was admitted to the hospital for acute UTI.     Assessment/Plan:   Principal Problem:   UTI (urinary tract infection) Active Problems:   BPH (benign prostatic hyperplasia)   Hypertension   Chronic kidney disease, stage 3a   Primary hyperparathyroidism   Hypokalemia    Body mass index is 29.29 kg/m.    Acute complicated UTI in the setting of Foley catheter placement for recent urinary retention: Completed antibiotics.   General weakness, advanced age, frailty, complicated grief: Patient's spouse reportedly expired about 10 days prior to admission.  Previously, he had refused placement to SNF.  However, he is now agreeable to go to SNF.   BPH with acute urinary retention, Foley catheter in place.  Continue Flomax.  Outpatient follow-up with Dr. Richardo Hanks, urologist, in 2 weeks for voiding trial.   Hypokalemia: Improved   Other comorbidities include CKD stage IIIa, hypertension, primary hyperparathyroidism, constipation, hearing impairment   Diet Order             Diet regular Fluid consistency: Thin  Diet effective now                             Consultants: Urologist Palliative care  Procedures: None    Medications:    Chlorhexidine Gluconate Cloth  6 each Topical Q0600   cinacalcet  30 mg Oral BID WC   enoxaparin (LOVENOX) injection  40 mg Subcutaneous Q24H   finasteride  5 mg Oral Daily   labetalol  100 mg Oral Q1500   loratadine  10 mg Oral Daily   nystatin   Topical TID   oxybutynin  10 mg Oral QHS   pantoprazole  40 mg Oral Daily   tamsulosin  0.4 mg Oral BID   traZODone  50 mg Oral QHS   Continuous Infusions:   Anti-infectives (From admission, onward)    Start     Dose/Rate Route Frequency Ordered Stop   02/27/23 1100  cefTRIAXone (ROCEPHIN) 1 g in sodium chloride 0.9 % 100 mL IVPB        1 g 200 mL/hr over 30 Minutes Intravenous Every 24 hours 02/27/23 0751 03/02/23 1256   02/26/23 1100  cefTRIAXone (ROCEPHIN) 1 g in sodium chloride 0.9 % 100 mL IVPB        1 g 200 mL/hr over 30 Minutes Intravenous  Once 02/26/23 1046 02/26/23 1200              Family Communication/Anticipated D/C date and plan/Code Status   DVT prophylaxis: enoxaparin (LOVENOX) injection 40 mg Start: 02/26/23 2200 SCDs Start: 02/26/23 1227     Code  Status: DNR  Family Communication: None Disposition Plan: Plan to discharge to SNF tomorrow   Status is: Inpatient Remains inpatient appropriate because: Awaiting placement to SNF       Subjective:   Interval events noted.  No new complaints.  Objective:    Vitals:   03/05/23 1552 03/05/23 2032 03/06/23 0424 03/06/23 0742  BP: 139/69 122/72 (!) 146/80 (!) 142/73  Pulse: (!) 57 63 71 94  Resp: Temp: 98 F (36.7 C) 98 F (36.7 C) 97.6 F (36.4 C) 98.9 F (37.2 C)  TempSrc:  Oral    SpO2: 94% 96% 95% 95%  Weight:      Height:       No data found.   Intake/Output Summary (Last 24 hours) at 03/06/2023 1257 Last data filed at 03/06/2023 1027 Gross per 24 hour  Intake 120 ml  Output 1350 ml  Net -1230 ml   Filed  Weights   02/26/23 0917  Weight: 95.3 kg    Exam:  GEN: NAD SKIN: Warm and dry EYES: EOMI ENT: MMM, hearing impairment,? nasal speech CV: RRR PULM: CTA B ABD: soft, ND, NT, +BS CNS: AAO x 3, non focal EXT: No edema or tenderness        Data Reviewed:   I have personally reviewed following labs and imaging studies:  Labs: Labs show the following:   Basic Metabolic Panel: Recent Labs  Lab 02/28/23 1229 03/01/23 0821 03/02/23 0801  NA 135 136 136  K 4.0 3.7 4.2  CL 106 104 105  CO2 GLUCOSE 112* 101* 109*  BUN CREATININE 1.46* 1.50* 1.48*  CALCIUM 10.7* 10.7* 10.6*   GFR Estimated Creatinine Clearance: 39.1 mL/min (A) (by C-G formula based on SCr of 1.48 mg/dL (H)). Liver Function Tests: No results for input(s): "AST", "ALT", "ALKPHOS", "BILITOT", "PROT", "ALBUMIN" in the last 168 hours. No results for input(s): "LIPASE", "AMYLASE" in the last 168 hours. No results for input(s): "AMMONIA" in the last 168 hours. Coagulation profile No results for input(s): "INR", "PROTIME" in the last 168 hours.  CBC: Recent Labs  Lab 03/01/23 0821  WBC 8.5  NEUTROABS 4.9  HGB 11.9*  HCT 36.3*  MCV 90.8  PLT 172   Cardiac Enzymes: No results for input(s): "CKTOTAL", "CKMB", "CKMBINDEX", "TROPONINI" in the last 168 hours. BNP (last 3 results) No results for input(s): "PROBNP" in the last 8760 hours. CBG: No results for input(s): "GLUCAP" in the last 168 hours. D-Dimer: No results for input(s): "DDIMER" in the last 72 hours. Hgb A1c: No results for input(s): "HGBA1C" in the last 72 hours. Lipid Profile: No results for input(s): "CHOL", "HDL", "LDLCALC", "TRIG", "CHOLHDL", "LDLDIRECT" in the last 72 hours. Thyroid function studies: No results for input(s): "TSH", "T4TOTAL", "T3FREE", "THYROIDAB" in the last 72 hours.  Invalid input(s): "FREET3" Anemia work up: No results for input(s): "VITAMINB12", "FOLATE", "FERRITIN", "TIBC", "IRON",  "RETICCTPCT" in the last 72 hours. Sepsis Labs: Recent Labs  Lab 03/01/23 0821  WBC 8.5    Microbiology Recent Results (from the past 240 hour(s))  Blood Culture (routine x 2)     Status: None   Collection Time: 02/26/23  9:24 AM   Specimen: BLOOD  Result Value Ref Range Status   Specimen Description BLOOD LEFT ARM  Final   Special Requests BOTTLES DRAWN AEROBIC AND ANAEROBIC BCHV  Final   Culture   Final    NO GROWTH 5 DAYS Performed at [facility] [facility], [address], [place], [place] [address]    Report Status 03/03/2023 FINAL  Final  Blood Culture (routine x 2)     Status: None   Collection Time: 02/26/23  9:24 AM   Specimen: BLOOD  Result Value Ref Range Status   Specimen Description BLOOD RIGHT ARM  Final   Special Requests   Final    BOTTLES DRAWN AEROBIC AND ANAEROBIC Blood Culture adequate volume   Culture   Final    NO GROWTH 5 DAYS Performed at [facility], [address], [place], [place] [address]    Report Status 03/03/2023 FINAL  Final  Urine Culture (for pregnant, neutropenic or urologic patients or patients with an indwelling urinary catheter)     Status: None   Collection Time: 02/27/23 10:42 AM   Specimen: Urine, Clean Catch  Result Value Ref Range Status   Specimen Description   Final    URINE, CLEAN CATCH Performed at [facility], [address], [place], [place] [address]    Special Requests   Final    NONE Performed at [facility], [address], [place], [place] [address]    Culture   Final    NO GROWTH Performed at [facility], [address] [place], [place] [address]    Report Status 02/28/2023 FINAL  Final    Procedures and diagnostic studies:  No results found.             LOS: 8 days   Kiamesha Samet  [facility]   Pager on [email or web address]. If 7PM-7AM, please contact night-coverage at [email or web address]     03/06/2023, 12:57 PM

## 2023-03-06 NOTE — Progress Notes (Signed)
Occupational Therapy Treatment Patient Details Name: Jeffrey Dennis MRN: [id number] DOB: [birth year] Today's Date: 03/06/2023   History of present illness Pt is a 89 y.o. male with past medical history significant for arthritis who presents to the emergency department with right shoulder pain and hip pain.  States that he has had a fall over the past couple of days.   OT comments  Patient agreeable to OT/PT co-treatment to maximize safety and participation. Pt able to come to EOB with Mod A this date. Demonstration was then provided for lateral scoot transfer to drop arm BSC for improved independence in toileting routine. Pt deferred completing functional transfer to [facility] this date 2/2 nausea and B hip pain (RN aware). He required Min A +2 to return to supine and then engaged in bed level grooming tasks. Pt left as received with all needs in reach. OT will continue to follow acutely.     Recommendations for follow up therapy are one component of a multi-disciplinary discharge planning process, led by the attending physician.  Recommendations may be updated based on patient status, additional functional criteria and insurance authorization.    Assistance Recommended at Discharge Frequent or constant Supervision/Assistance  Patient can return home with the following  Two people to help with bathing/dressing/bathroom;Two people to help with walking and/or transfers;Assistance with cooking/housework;Assist for transportation;Help with stairs or ramp for entrance;Direct supervision/assist for financial management;Direct supervision/assist for medications management   Equipment Recommendations  Hospital bed;Other (comment) (sit<>stand lift, drop arm BSC)    Recommendations for Other Services      Precautions / Restrictions Precautions Precautions: Fall Restrictions Weight Bearing Restrictions: No       Mobility Bed Mobility Overal bed mobility: Needs Assistance Bed Mobility: Supine to Sit,  Sit to Supine     Supine to sit: HOB elevated, Mod assist Sit to supine: Min assist, +2 for physical assistance   General bed mobility comments: Min A to scoot hips forward at EOB    Transfers Overall transfer level: Needs assistance     General transfer comment: Pt deferred standing as well as practicing lateral scoot transfer to [facility] this date     Balance Overall balance assessment: Needs assistance Sitting-balance support: Feet supported Sitting balance-Leahy Scale: Fair         ADL either performed or assessed with clinical judgement   ADL Overall ADL's : Needs assistance/impaired     Grooming: Set up;Bed level;Wash/dry face       General ADL Comments: Pt deferred further self-care tasks 2/2 nausea and hip pain.    Extremity/Trunk Assessment Upper Extremity Assessment Upper Extremity Assessment: Generalized weakness   Lower Extremity Assessment Lower Extremity Assessment: Generalized weakness        Vision Baseline Vision/History: 1 Wears glasses Patient Visual Report: No change from baseline     Perception     Praxis      Cognition Arousal/Alertness: Awake/alert Behavior During Therapy: WFL for tasks assessed/performed Overall Cognitive Status: Within Functional Limits for tasks assessed            Exercises      Shoulder Instructions       General Comments      Pertinent Vitals/ Pain       Pain Assessment Pain Assessment: Faces Faces Pain Scale: Hurts a little bit Pain Location: foley site, B hips Pain Descriptors / Indicators: Aching, Grimacing, Guarding Pain Intervention(s): Limited activity within patient's tolerance, Monitored during session, Repositioned, Patient requesting pain meds-RN notified  Home Living  Prior Functioning/Environment              Frequency  Min 1X/week        Progress Toward Goals  OT Goals(current goals can now be found in the care plan section)  Progress towards OT goals:  Progressing toward goals  Acute Rehab OT Goals Patient Stated Goal: to go home OT Goal Formulation: With patient Time For Goal Achievement: 03/15/23 Potential to Achieve Goals: Fair  Plan Discharge plan needs to be updated;Frequency needs to be updated    Co-evaluation    PT/OT/SLP Co-Evaluation/Treatment: Yes Reason for Co-Treatment: For patient/therapist safety;To address functional/ADL transfers PT goals addressed during session: Balance OT goals addressed during session: ADL's and self-care      AM-PAC OT "6 Clicks" Daily Activity     Outcome Measure   Help from another person eating meals?: A Little Help from another person taking care of personal grooming?: A Little Help from another person toileting, which includes using toliet, bedpan, or urinal?: A Lot Help from another person bathing (including washing, rinsing, drying)?: A Lot Help from another person to put on and taking off regular upper body clothing?: A Little Help from another person to put on and taking off regular lower body clothing?: A Lot 6 Click Score: 15    End of Session    OT Visit Diagnosis: Unsteadiness on feet (R26.81);Other abnormalities of gait and mobility (R26.89);Muscle weakness (generalized) (M62.81);History of falling (Z91.81);Pain   Activity Tolerance Patient limited by fatigue;Other (comment) (nausea)   Patient Left in bed;with call bell/phone within reach;with bed alarm set;Other (comment) (RN present to give meds)   Nurse Communication Mobility status        Time: 1610-9604 OT Time Calculation (min): 18 min  Charges: OT General Charges $OT Visit: 1 Visit OT Treatments $Self Care/Home Management : 8-22 mins  [facility] MS, OTR/L ascom [phone]  03/06/23, 1:04 PM

## 2023-03-07 DIAGNOSIS — N3000 Acute cystitis without hematuria: Secondary | ICD-10-CM | POA: Diagnosis not present

## 2023-03-07 NOTE — TOC Transition Note (Signed)
Transition of Care [facility]) - CM/SW Discharge Note   Patient Details  Name: Jeffrey Dennis MRN: [id number] Date of Birth: October 30, 1930  [facility] CM/SW Contact:  Allena Katz, LCSW Phone Number: 03/07/2023, 9:43 AM   Clinical Narrative:  Pt has orders to discharge to [facility] and rehab. RN given number for report. DC summary sent. Marcelino Duster with [facility] notified. Medical necessity printed to unit. CSW to arrange EMS.           Patient Goals and CMS Choice      Discharge Placement                         Discharge Plan and Services Additional resources added to the After Visit Summary for                                       Social Determinants of Health (SDOH) Interventions SDOH Screenings   Food Insecurity: No Food Insecurity (02/26/2023)  Housing: Low Risk  (02/26/2023)  Transportation Needs: No Transportation Needs (02/26/2023)  Utilities: Not At Risk (02/26/2023)  Tobacco Use: Low Risk  (03/06/2023)     Readmission Risk Interventions     No data to display

## 2023-03-07 NOTE — Discharge Summary (Addendum)
Physician Discharge Summary   Patient: Jeffrey Dennis MRN: [id number] DOB: 1930/02/23  Admit date:     02/26/2023  Discharge date: 03/07/23  Discharge Physician: Lurene Shadow   PCP: Danella Penton, MD   Recommendations at discharge:   Follow-up with PCP in 1 to 2 weeks Follow-up with Dr. Richardo Hanks, urologist in 2 weeks Outpatient follow-up with palliative care within 2 weeks of discharge  Discharge Diagnoses: Principal Problem:   UTI (urinary tract infection) Active Problems:   BPH (benign prostatic hyperplasia)   Hypertension   Chronic kidney disease, stage 3a   Primary hyperparathyroidism   Hypokalemia  Resolved Problems:   * No resolved hospital problems. [facility] Course:  Jeffrey Dennis is a 89 y.o. male with medical history significant for hearing impairment, hypertension, CKD stage III a, osteoarthritis, BPH s/p TURP, left lower incontinence, gout, who presented to the hospital with generalized weakness and poor oral intake.  She was evaluated at an outside emergency department about 1 week prior to admission and he was found to have acute urinary retention.  Foley catheter was placed at that time.  Patient has been treated with Omnicef followed by Levaquin prior to admission.  He was due for urology follow-up the day that he presented for admission.     He was admitted to the hospital for acute UTI.  He was treated with IV ceftriaxone.  Urine culture on this hospital visit did not show any growth.  However, urine culture obtained on 02/18/2023, prior to this admission, showed mixed gram-positive and gram-negative organisms.  Previous urine culture in 2023 showed E. coli.   He is stable for discharge.   Assessment and Plan:  Acute complicated UTI in the setting of Foley catheter placement for recent urinary retention: Completed 5 days of IV ceftriaxone.     General weakness, advanced age, frailty, complicated grief: Patient's spouse reportedly expired about 10 days  prior to admission.  Previously, he had refused placement to SNF.  He consulted hospice met with the hospice team.  He decided against hospice.  He is now agreeable to go to SNF.     BPH with acute urinary retention, Foley catheter in place.  Continue Flomax.  Outpatient follow-up with Dr. Richardo Hanks, urologist, in 2 weeks for voiding trial.     Hypokalemia: Improved     Other comorbidities include CKD stage IIIa, hypertension, primary hyperparathyroidism, constipation, hearing impairment     I called his [name] and Larita Fife (daughters) but it went to voicemail.        Consultants: Urologist Procedures performed: None Disposition: Skilled nursing facility Diet recommendation:  Cardiac diet DISCHARGE MEDICATION: Allergies as of 03/07/2023       Reactions   Doxazosin Other (See Comments)   Telmisartan Other (See Comments)        Medication List     STOP taking these medications    cefdinir 300 MG capsule Commonly known as: OMNICEF   HYDROcodone-acetaminophen 5-325 MG tablet Commonly known as: NORCO/VICODIN   levofloxacin 250 MG tablet Commonly known as: LEVAQUIN   melatonin 5 MG Tabs   QUEtiapine 25 MG tablet Commonly known as: SEROQUEL   Vitamin D (Ergocalciferol) 1.25 MG (50000 UNIT) Caps capsule Commonly known as: DRISDOL       TAKE these medications    acetaminophen 325 MG tablet Commonly known as: TYLENOL Take 2 tablets (650 mg total) by mouth every 6 (six) hours as needed for mild pain or fever.   artificial tears  Oint ophthalmic ointment Commonly known as: LACRILUBE Place into both eyes every 4 (four) hours as needed for dry eyes.   cinacalcet 30 MG tablet Commonly known as: SENSIPAR Take 1 tablet (30 mg total) by mouth 2 (two) times daily with a meal.   clotrimazole-betamethasone cream Commonly known as: LOTRISONE Apply 1 Application topically 2 (two) times daily.   finasteride 5 MG tablet Commonly known as: PROSCAR Take 5 mg by mouth  daily.   fluticasone 50 MCG/ACT nasal spray Commonly known as: FLONASE Place 2 sprays into both nostrils daily.   hydrochlorothiazide 25 MG tablet Commonly known as: HYDRODIURIL Take 25 mg by mouth daily.   labetalol 100 MG tablet Commonly known as: NORMODYNE Take 1 tablet (100 mg total) by mouth daily in the afternoon.   loratadine 10 MG tablet Commonly known as: CLARITIN Take 1 tablet (10 mg total) by mouth daily.   omeprazole 40 MG capsule Commonly known as: PRILOSEC Take 40 mg by mouth daily.   oxybutynin 5 MG 24 hr tablet Commonly known as: DITROPAN-XL Take 10 mg by mouth at bedtime.   polyethylene glycol 17 g packet Commonly known as: MIRALAX / GLYCOLAX Take 17 g by mouth daily.   tamsulosin 0.4 MG Caps capsule Commonly known as: FLOMAX Take 0.4 mg by mouth 2 (two) times daily.               Durable Medical Equipment  (From admission, onward)           Start     Ordered   03/01/23 1139  For home use only DME Hospital bed  Once       Comments: Therapeutic matteress  Question Answer Comment  Length of Need Lifetime   Patient has (list medical condition): adult failure to thrive   The above medical condition requires: Patient requires the ability to reposition frequently   Head must be elevated greater than: 45 degrees   Bed type Semi-electric      03/01/23 1139            Contact information for follow-up providers     Sondra Come, MD Follow up in 2 week(s).   Specialty: Urology Why: Outpatient urology followup for voiding trial Contact information: [address] [place] [address] [phone]              Contact information for after-discharge care     Destination     [facility] Preferred SNF .   Service: Skilled Nursing Contact information: [address] [place] [address] [phone]                    Discharge Exam: Ceasar Mons Weights    02/26/23 0917  Weight: 95.3 kg   GEN: NAD SKIN: Warm and dry EYES: No pallor or icterus ENT: MMM, hearing impairment CV: RRR PULM: CTA B ABD: soft, ND, NT, +BS CNS: AAO x 3, non focal EXT: No edema or tenderness   Condition at discharge: good  The results of significant diagnostics from this hospitalization (including imaging, microbiology, ancillary and laboratory) are listed below for reference.   Imaging Studies: DG Abd 1 View  Result Date: 03/02/2023 CLINICAL DATA:  Constipation EXAM: ABDOMEN - 1 VIEW COMPARISON:  None Available. FINDINGS: The bowel gas pattern is normal. Small colonic stool burden. Osseous structures are age-appropriate. Multiple phleboliths noted within the pelvis. No radio-opaque calculi or other significant radiographic abnormality are seen. IMPRESSION: 1. Nonobstructive bowel  gas pattern. Small colonic stool burden. Electronically Signed   By: Helyn Numbers M.D.   On: 03/02/2023 21:26   DG Abd 1 View  Result Date: 02/27/2023 CLINICAL DATA:  Constipation EXAM: ABDOMEN - 1 VIEW COMPARISON:  CT 02/13/2020 FINDINGS: Nonobstructive bowel gas pattern. Moderate colonic stool burden most prominent in the ascending and transverse colon. No radiopaque calculi overlie the kidneys. Degenerative changes of the spine and hips. IMPRESSION: Moderate colonic stool burden most prominent in the ascending and transverse colon. No evidence of bowel obstruction. Electronically Signed   By: Caprice Renshaw M.D.   On: 02/27/2023 10:19   DG Chest Port 1 View  Result Date: 02/26/2023 CLINICAL DATA:  Possible sepsis EXAM: PORTABLE CHEST 1 VIEW COMPARISON:  05/10/2022 FINDINGS: Cardiac shadow is stable. Lungs are well aerated bilaterally. No focal infiltrate or sizable effusion is seen. No bony abnormality is noted. IMPRESSION: No active disease. Electronically Signed   By: Alcide Clever M.D.   On: 02/26/2023 10:28    Microbiology: Results for orders placed or performed during the hospital  encounter of 02/26/23  Blood Culture (routine x 2)     Status: None   Collection Time: 02/26/23  9:24 AM   Specimen: BLOOD  Result Value Ref Range Status   Specimen Description BLOOD LEFT ARM  Final   Special Requests BOTTLES DRAWN AEROBIC AND ANAEROBIC BCHV  Final   Culture   Final    NO GROWTH 5 DAYS Performed at [facility], [address], [place], [place] [address]    Report Status 03/03/2023 FINAL  Final  Blood Culture (routine x 2)     Status: None   Collection Time: 02/26/23  9:24 AM   Specimen: BLOOD  Result Value Ref Range Status   Specimen Description BLOOD RIGHT ARM  Final   Special Requests   Final    BOTTLES DRAWN AEROBIC AND ANAEROBIC Blood Culture adequate volume   Culture   Final    NO GROWTH 5 DAYS Performed at [facility], [address], [place], [place] [address]    Report Status 03/03/2023 FINAL  Final  Urine Culture (for pregnant, neutropenic or urologic patients or patients with an indwelling urinary catheter)     Status: None   Collection Time: 02/27/23 10:42 AM   Specimen: Urine, Clean Catch  Result Value Ref Range Status   Specimen Description   Final    URINE, CLEAN CATCH Performed at [facility], [address], [place], [place] [address]    Special Requests   Final    NONE Performed at [facility], [address] [place], [place] [address]    Culture   Final    NO GROWTH Performed at [facility], [address], [place], [place] [address]    Report Status 02/28/2023 FINAL  Final    Labs: CBC: Recent Labs  Lab 03/01/23 0821  WBC 8.5  NEUTROABS 4.9  HGB 11.9*  HCT 36.3*  MCV 90.8  PLT 172   Basic Metabolic Panel: Recent Labs  Lab 02/28/23 1229 03/01/23 0821 03/02/23 0801  NA 135 136 136  K 4.0 3.7 4.2  CL 106 104 105  CO2 GLUCOSE 112* 101* 109*  BUN CREATININE 1.46* 1.50* 1.48*  CALCIUM 10.7* 10.7* 10.6*   Liver Function Tests: No results for  input(s): "AST", "ALT", "ALKPHOS", "BILITOT", "PROT", "ALBUMIN" in the last 168 hours. CBG: No results for input(s): "GLUCAP" in  the last 168 hours.  Discharge time spent: greater than 30 minutes.  Signed: Lurene Shadow, MD [facility] 03/07/2023

## 2023-03-07 NOTE — Care Management Important Message (Signed)
Important Message  Patient Details  Name: Jeffrey Dennis MRN: [id number] Date of Birth: [birth year]   Medicare Important Message Given:  Yes     [name] A Breckin Savannah 03/07/2023, 10:57 AM

## 2023-03-14 ENCOUNTER — Non-Acute Institutional Stay: Payer: Medicare Other | Admitting: Hospice

## 2023-03-14 DIAGNOSIS — R531 Weakness: Secondary | ICD-10-CM

## 2023-03-14 DIAGNOSIS — Z515 Encounter for palliative care: Secondary | ICD-10-CM

## 2023-03-14 DIAGNOSIS — N390 Urinary tract infection, site not specified: Secondary | ICD-10-CM

## 2023-03-14 DIAGNOSIS — N401 Enlarged prostate with lower urinary tract symptoms: Secondary | ICD-10-CM

## 2023-03-14 NOTE — Progress Notes (Signed)
Therapist, nutritional Palliative Care Consult Note Telephone: [phone]  Fax: [phone]  PATIENT NAME: Jeffrey Dennis [address] [place] [address] [phone] (home)  DOB: 1930/11/09 MRN: [id number]  PRIMARY CARE PROVIDER:    Danella Penton, MD,  [address] [facility] [address] [facility] [place] [address] [phone]  REFERRING PROVIDER:   Danella Penton, MD [address] [facility] [address] [facility] [place],  [place] [address] [phone]  RESPONSIBLE PARTY:   Self Contact Information     Name Relation Home Work Mobile   Dendron Daughter   [phone]   Tacy Dura Daughter [phone]  [phone]        I met face to face with patient in the facility. Visit to build trust and highlight Palliative Medicine as specialized medical care for people living with serious illness, aimed at facilitating better quality of life through symptoms relief, assisting with advance care planning and complex medical decision making.  NP updated [name] on visit.  She expressed appreciation for the call, and requested for next visit that patient be encouraged to a little longer at the SNF for his complete rehab..  Visit consisted of counseling and education dealing with the complex and emotionally intense issues of symptom management and palliative care in the setting of serious and potentially life-threatening illness.  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  He lost his wife last month.  Therapeutic listening and ample emotional support provided.  Palliative care team will continue to support patient, patient's family, and medical team.   Palliative care team will continue to support patient, patient's family, and medical team.  ASSESSMENT AND / RECOMMENDATIONS:   ------------------------------------------------------------------------------------------------------------------------------------------------- Advance Care Planning: Our advance care planning conversation included a discussion about:    The value and importance of advance care planning  Difference between Hospice and Palliative care Exploration of goals of care in the event of a sudden injury or illness  Identification and preparation of a healthcare agent  Review and updating or creation of an  advance directive document . Decision not to resuscitate or to de-escalate disease focused treatments due to poor prognosis.  CODE STATUS: He affirmed he is a Do Not Resuscitate.   Goals of Care: Goals include to maximize quality of life and symptom management  I spent  20 minutes providing this initial consultation. More than 50% of the time in this consultation was spent on counseling patient and coordinating communication. --------------------------------------------------------------------------------------------------------------------------------------  Symptom Management/Plan: UTI: hospitalization for acute complicated UTI 4/16-4/25/24, completed antibiotics, in SNF for rehab.  Monitor for urinary symptoms.  Follow-up with urologist as planned.  Routine CBC BMP.   Weakness:  Worsened with recent hospitalization for acute complicated UTI 4/16-4/25/24, completed antibiotics, in SNF for rehab.  Currently nonambulatory.  PT/OT for strengthening, transfers and environmental adaptation.  Fall precautions. BPH: s/p TURP.  On foley cath for urinary retention. Continue Flomax as ordered.  Plan for voiding trial. Follow up with Urologist as planned for 03/14/22.   Follow up: Palliative care will continue to follow for complex medical decision making, advance care planning, and clarification of goals. Return 6 weeks or prn. Encouraged to call provider sooner with any concerns.   Family  /Caregiver/Community Supports: Patient in SNF for rehab.  HOSPICE ELIGIBILITY/DIAGNOSIS: TBD  Chief Complaint: Initial Palliative care visit  HISTORY OF PRESENT ILLNESS:  Jeffrey Dennis is a 89 y.o. year old male  with multiple morbidities  requiring close monitoring/management, and with high risk of complications and  mortality: Acute complicated UTI, hypertension, CKD stage III a, osteoarthritis, BPH s/p TURP, left lower incontinence, gout,  He denies urinary symptoms, no pain/discomfort. He endorses weakness. History obtained from review of EMR, discussion with primary team, caregiver, family and/or [name].  Review and summarization of Epic records shows history from other than patient. Rest of 10 point ROS asked and negative. Independent interpretation of tests and reviewed as needed, available labs, patient records, imaging, studies and related documents from the EMR.  PHYSICAL EXAM: GEN: in no acute distress, hard of hearing Cardiac: S1 S2, no LE edema feet/ankles  Respiratory:  clear to auscultation bilaterally, GI: soft, nontender,  + BS   GU: Foley catheter in place, light clear yellow urine in Foley bag. MS: nonambulatory, Hoyer for transfers Skin: warm and dry, no rash to visible skin Neuro: Generalized weakness, nonfocal, alert and oriented x 3 Psych: non-anxious affect    PAST MEDICAL HISTORY:  Active Ambulatory Problems    Diagnosis Date Noted   Acute on chronic renal failure (HCC) 10/23/2012   Encephalopathy acute 10/23/2012   Leukocytosis 10/23/2012   Gastritis 10/24/2012   UTI (urinary tract infection) 02/15/2020   Urinary retention due to benign prostatic hyperplasia 02/15/2020   Pneumonia due to COVID-19 virus 05/11/2022   Hypertension    BPH (benign prostatic hyperplasia)    Thrombocytopenia (HCC)    Chronic kidney disease, stage 3a (HCC)    Generalized weakness    Insomnia 05/14/2022   Constipation 05/19/2022   Hypercalcemia 05/20/2022   Vitamin D  deficiency 05/21/2022   Primary hyperparathyroidism (HCC)    Hypokalemia 02/26/2023   Resolved Ambulatory Problems    Diagnosis Date Noted   No Resolved Ambulatory Problems   Past Medical History:  Diagnosis Date   Arthritis    Bilateral hydronephrosis    GERD (gastroesophageal reflux disease)    Hearing decreased    History of hepatitis A    Incontinence of urine     SOCIAL HX:  Social History   Tobacco Use   Smoking status: Never   Smokeless tobacco: Never  Substance Use Topics   Alcohol use: No     FAMILY HX:  Family History  Problem Relation Age of Onset   Stroke Father    Heart attack Father    Stroke Mother       ALLERGIES:  Allergies  Allergen Reactions   Doxazosin Other (See Comments)   Telmisartan Other (See Comments)      PERTINENT MEDICATIONS:  Outpatient Encounter Medications as of 03/14/2023  Medication Sig   acetaminophen (TYLENOL) 325 MG tablet Take 2 tablets (650 mg total) by mouth every 6 (six) hours as needed for mild pain or fever.   artificial tears (LACRILUBE) OINT ophthalmic ointment Place into both eyes every 4 (four) hours as needed for dry eyes.   cinacalcet (SENSIPAR) 30 MG tablet Take 1 tablet (30 mg total) by mouth 2 (two) times daily with a meal. (Patient not taking: Reported on 02/26/2023)   clotrimazole-betamethasone (LOTRISONE) cream Apply 1 Application topically 2 (two) times daily.   finasteride (PROSCAR) 5 MG tablet Take 5 mg by mouth daily.   fluticasone (FLONASE) 50 MCG/ACT nasal spray Place 2 sprays into both nostrils daily.   hydrochlorothiazide (HYDRODIURIL) 25 MG tablet Take 25 mg by mouth daily.   labetalol (NORMODYNE) 100 MG tablet Take 1 tablet (100 mg total) by mouth daily in the afternoon.   loratadine (CLARITIN)  10 MG tablet Take 1 tablet (10 mg total) by mouth daily.   omeprazole (PRILOSEC) 40 MG capsule Take 40 mg by mouth daily.   oxybutynin (DITROPAN-XL) 5 MG 24 hr tablet Take 10 mg by mouth at bedtime.    polyethylene glycol (MIRALAX / GLYCOLAX) 17 g packet Take 17 g by mouth daily.   tamsulosin (FLOMAX) 0.4 MG CAPS capsule Take 0.4 mg by mouth 2 (two) times daily.   No facility-administered encounter medications on file as of 03/14/2023.     Thank you for the opportunity to participate in the care of Mr. Treese.  The palliative care team will continue to follow. Please call our office at [phone] if we can be of additional assistance.   Note: Portions of this note were generated with Scientist, clinical (histocompatibility and immunogenetics). Dictation errors may occur despite best attempts at proofreading.  Rosaura Carpenter, NP

## 2023-03-15 ENCOUNTER — Ambulatory Visit: Payer: Medicare Other | Admitting: Physician Assistant

## 2023-03-15 ENCOUNTER — Telehealth: Payer: Self-pay | Admitting: *Deleted

## 2023-03-15 VITALS — BP 130/84 | HR 67 | Ht 71.0 in | Wt 210.0 lb

## 2023-03-15 DIAGNOSIS — R339 Retention of urine, unspecified: Secondary | ICD-10-CM

## 2023-03-15 DIAGNOSIS — R531 Weakness: Secondary | ICD-10-CM

## 2023-03-15 NOTE — Patient Instructions (Addendum)
Please call me in clinic at [phone] when you get back home to let me know that you have arrived so I may put in a referral back to Palliative Care.  You will be due for a catheter exchange on 03/28/2023 and monthly thereafter. If there are any issues with getting this done at home, I can do these in clinic as well.

## 2023-03-15 NOTE — Progress Notes (Signed)
03/15/2023 1:03 PM   Jeffrey Dennis 1930-08-30 [id number]  CC: Chief Complaint  Patient presents with   Follow-up   HPI: Jeffrey Dennis is a 88 y.o. male with complex urologic history related to BPH with recent history of acute urinary retention requiring Foley catheter placement in early April with subsequent admission for UTI and general, profound weakness, frailty, and complicated grief in the setting of his wife's recent demise who presents today for outpatient follow-up.  He is accompanied today by his daughter, [name], who contributes to HPI.  He presents to clinic today from [facility], where he is currently residing for rehab.  [name] expresses plans to coordinate transportation from our clinic directly to his primary residence and to leave AMA from [place], because he states that he wants to go home, see his cat, and "go be with the Shaune Pollack."  She states that he qualifies for 2 hours of in-home services daily through [facility] and that she and her sister plan to alternate to provide him care in his home outside of those hours.  They are open to consideration of home hospice, however are concerned that if they pursue this they will lose the 2 hours of daily services they will be getting.  He is frail appearing and somnolent today.  He is sitting in a wheelchair and does not attempt to rise.  He occasionally awakens to ask questions.  Foley catheter in place draining clear, yellow urine.  PMH: Past Medical History:  Diagnosis Date   Arthritis    Bilateral hydronephrosis    GERD (gastroesophageal reflux disease)    Hearing decreased    History of hepatitis A    Hypertension    Incontinence of urine     Surgical History: Past Surgical History:  Procedure Laterality Date   CYSTOSCOPY W/ RETROGRADES  10/21/2012   Procedure: CYSTOSCOPY WITH RETROGRADE PYELOGRAM;  Surgeon: Anner Crete, MD;  Location: [facility];  Service: Urology;  Laterality: Bilateral;  cystoscopy  bilateral retrograde  ureteral dilatation     NOSE SURGERY  1959   PROSTATE SURGERY     PROSTATECTOMY  2004   TONSILLECTOMY     TRANSURETHRAL RESECTION OF PROSTATE  10/21/2012   Procedure: TRANSURETHRAL RESECTION OF THE PROSTATE (TURP);  Surgeon: Anner Crete, MD;  Location: [facility];  Service: Urology;  Laterality: N/A;    Home Medications:  Allergies as of 03/15/2023       Reactions   Doxazosin Other (See Comments)   Telmisartan Other (See Comments)        Medication List        Accurate as of Mar 15, 2023  1:03 PM. If you have any questions, ask your nurse or doctor.          STOP taking these medications    clotrimazole-betamethasone cream Commonly known as: LOTRISONE   hydrochlorothiazide 25 MG tablet Commonly known as: HYDRODIURIL       TAKE these medications    acetaminophen 325 MG tablet Commonly known as: TYLENOL Take 2 tablets (650 mg total) by mouth every 6 (six) hours as needed for mild pain or fever.   acetaminophen-codeine 300-30 MG tablet Commonly known as: TYLENOL #3 Take 1 tablet by mouth 3 (three) times daily as needed.   artificial tears Oint ophthalmic ointment Commonly known as: LACRILUBE Place into both eyes every 4 (four) hours as needed for dry eyes.   cinacalcet 30 MG tablet Commonly known as: SENSIPAR Take 1  tablet (30 mg total) by mouth 2 (two) times daily with a meal.   finasteride 5 MG tablet Commonly known as: PROSCAR Take 5 mg by mouth daily.   fluticasone 50 MCG/ACT nasal spray Commonly known as: FLONASE Place 2 sprays into both nostrils daily.   labetalol 100 MG tablet Commonly known as: NORMODYNE Take 1 tablet (100 mg total) by mouth daily in the afternoon.   loratadine 10 MG tablet Commonly known as: CLARITIN Take 1 tablet (10 mg total) by mouth daily.   nystatin 100000 UNIT/ML suspension Commonly known as: MYCOSTATIN Take by mouth.   omeprazole 40 MG capsule Commonly known as: PRILOSEC Take 40 mg by mouth  daily.   oxybutynin 5 MG 24 hr tablet Commonly known as: DITROPAN-XL Take 10 mg by mouth at bedtime.   polyethylene glycol 17 g packet Commonly known as: MIRALAX / GLYCOLAX Take 17 g by mouth daily.   tamsulosin 0.4 MG Caps capsule Commonly known as: FLOMAX Take 0.4 mg by mouth 2 (two) times daily.        Allergies:  Allergies  Allergen Reactions   Doxazosin Other (See Comments)   Telmisartan Other (See Comments)    Family History: Family History  Problem Relation Age of Onset   Stroke Father    Heart attack Father    Stroke Mother     Social History:   reports that he has never smoked. He has never used smokeless tobacco. He reports that he does not drink alcohol and does not use drugs.  Physical Exam: BP 130/84   Pulse 67   Ht 5\' 11"  (1.803 m)   Wt 210 lb (95.3 kg)   BMI 29.29 kg/m   Constitutional: Somnolent and frail appearing, no acute distress, nontoxic appearing HEENT: , AT Cardiovascular: No clubbing, cyanosis, or edema Respiratory: Normal respiratory effort, no increased work of breathing Skin: No rashes, bruises or suspicious lesions Neurologic: Grossly intact, no focal deficits, moving all 4 extremities Psychiatric: Normal mood and affect  Assessment & Plan:   1. Urinary retention They were in our clinic for a prolonged period today while I communicated with the palliative care and social work teams to discuss his dispo.  Ultimately, they elected to return to [place] today but will leave AMA from there and arrange private transportation to his home.  With regard to his urinary retention, I attempted to explain that I have low suspicion that he will be able to spontaneously void with his continued profound weakness and frailty.  He asked me to "get to the point" and asked if I wanted him to keep the catheter.  I asked if he would be open to this and he agreed.  Will plan for chronic indwelling Foley catheter.  We discussed the need for monthly  exchanges, due next on 03/28/2023.  I asked [name] to let me know when they arrived back at his home, so I can place an outpatient referral back to palliative care.  I am hoping that they will be able to coordinate in-home Foley catheter exchanges, alternatively could see if his daughters are willing to be taught how to perform this at home. As a last resort he could be transported here on a monthly basis, though this will be extremely challenging given his weakness.  This is a very complicated situation and there is no easy answer here.  I do not think the patient has good insight into his limitations.  Return if symptoms worsen or fail to improve.  I  spent 70 minutes on the day of the encounter to include pre-visit record review, face-to-face time with the patient, and post-visit ordering of tests.   Carman Ching, PA-C  [facility] [place] [address], [address] [place], [place] [address] [phone]

## 2023-03-15 NOTE — Telephone Encounter (Signed)
Daughter calling asking if we could put in referral for palliative care hospice. I advised that referral has already been put in by Dr. Bethann Punches and pt had visit yesterday at [facility]. Pt is now at home and needing supplies and help. Daughter would like a personal call at home.

## 2023-03-15 NOTE — Telephone Encounter (Signed)
Spoke with daughter and advised results.  

## 2023-03-15 NOTE — Telephone Encounter (Signed)
I have placed a referral for outpatient Palliative Care and have communicated directly with the NP they met with yesterday from [facility] to make them aware. I have also made Dr. Hyacinth Meeker aware. I'll send these teams orders as needed to support his now chronic Foley.

## 2023-03-20 ENCOUNTER — Other Ambulatory Visit: Payer: Medicare Other

## 2023-03-20 ENCOUNTER — Telehealth: Payer: Self-pay

## 2023-03-20 VITALS — BP 100/62 | HR 97 | Resp 16

## 2023-03-20 DIAGNOSIS — Z515 Encounter for palliative care: Secondary | ICD-10-CM

## 2023-03-20 NOTE — Telephone Encounter (Signed)
Incoming call from [place] with Dr. Rondel Baton office confirming verbal order for hospice for patient. Referral sent to [facility] hospice referral center.

## 2023-03-20 NOTE — Progress Notes (Signed)
COMMUNITY PALLIATIVE CARE SW NOTE  PATIENT NAME: Jeffrey Dennis DOB: [birth year] MRN: [id number]  PRIMARY CARE PROVIDER: Danella Penton, MD  RESPONSIBLE PARTY:  Acct ID - Guarantor Home Phone Work Phone Relationship Acct Type  [id number] - [place]* [phone]  Self P/F     [address] [place], [place] [address]     PLAN OF CARE and INTERVENTIONS:              GOALS OF CARE/ ADVANCE CARE PLANNING: Goals include to maximize quality of life.                                                       Code Status: DNR                               ACD: none                               GOC: patient is hospice appropriate. Life expectancy to be determined by provider.  2.        SOCIAL/EMOTIONAL/SPIRITUAL ASSESSMENT/ INTERVENTIONS:         Palliative care encounter: SW and RN PJ completed initial in home visit with patient and patients SIL Amada Jupiter, caregiver Gigi Gin present as well. PC criteria reviewed and discussed. Patients SIL meet PC team in driveway prior to visit to inquire about determining life expectancy, foley cath cleaning, and hospice home services.   Presenting problem: 89 y.o. male with medical history significant of hearing impairment, hypertension, CKD stage IIIa, osteoarthritis, BPH s/p TURP overflow incontinence, gout presenting w/ UTI, urinary retention with recent foley cath placement. Patient recently dischared from [facility] on 4/29.  Pain: patient denied pain.  Mobility: patient is nonambulatory.  Appetite: poor.  Hospice: Patient received lying in recliner with eyes closed. Caregiver share that patient had been asleep all morning. SIL shared that patient looks and talks to figures that are not there. Patient is not eating, per caregiver patient may take small bites of food. Patients fluid intake is poor, patient did take in sips of water during visit. Patient appears to be dehydrated. Patient has not been moved from recliner since DC from SNF 4 days ago due to family and  caregivers being unable to transfer him. Patient is total care. PPS 30%. Patients wife passed last month and he states he is ready "leave" and go "be with her". Patient became more alert towards end of visit and shared he does not want to go to the hospital he only wants "to go home". Hospice appropriateness and services (PC vs Hospice) discussed with SIL Amada Jupiter. SIL is in agreement with obtaining a hospice order for hospice team to conduct an assessment. SIL inquired if PC would outreach and speak with patients daughter, [name] about visit as well, as daughter is concerned about patient loosing his [facility] care services should he transition to Hospice.   Psychosocial assessment: completed.   In home support: Patient lives alone, but has 24/7 care provided by family, friends and [facility] program.  Transportation: no needs.  Food: no food insecurities witnessed.   Safety and long term planning: patient feels safe in his home and desires to remain  in his home.    SW discussed goals, reviewed care plan, provided emotional support, used active and reflective listening in the form of reciprocity emotional response. Questions and concerns were addressed. The patient/family was encouraged to call with any additional questions and/or concerns. PC Provided general support and encouragement, no other unmet needs identified.    Hospice order requested from PCP office.   3.         PATIENT/CAREGIVER EDUCATION/ COPING:   Appearance: well groomed, appropriate given situation  Mental Status: A&O to self Eye Contact: poor Thought Process: rational  Thought Content: not assessed Speech: unclear and slurred  Mood: Normal and calm Affect: flat Insight: poor Judgement: poor Interaction Style: Cooperative   Patient able to make basic needs known when asked. Patient is HOH.    4.         PERSONAL EMERGENCY PLAN:  family will call 9-1-1 for emergencies.    5.         COMMUNITY RESOURCES  COORDINATION/ HEALTH CARE NAVIGATION:  [facility] program is involved providing private caregivers 10 hours a week.   6.      FINANCIAL CONCERNS/NEEDS: None                         Primary Health Insurance: [facility] Medicare Secondary Health Insurance: none      SOCIAL HX:  Social History   Tobacco Use   Smoking status: Never   Smokeless tobacco: Never  Substance Use Topics   Alcohol use: No    CODE STATUS: DNR ADVANCED DIRECTIVES: N MOST FORM COMPLETE:  N HOSPICE EDUCATION PROVIDED: Y  PPS:30%  TIMES SPENT: 30 MIN    Katyana Trolinger [place], [place]

## 2023-03-20 NOTE — Telephone Encounter (Signed)
1232 Palliative Care Note  RN, as requested, reached out to pt's daugther Burman Blacksmith on her lunch break. RN explained role and purpose of palliative care including visit frequency. Also discussed benefits of hospice care as well as the differences between the two with her.   RN told dtr that based on today's assessment and visit with pt, that she felt that pt was appropriate for hospice evaluation and that her husband had given permission to pursue that. She was in agreement.  Daughter reported that they really didn't want to lose the 10 hrs of care per week by [facility] ( home care). RN shared that SW had reached out to try to find out answers to this regarding Hospice care and [facility] together.   Answered all of daughters questions. Gave contact info and requested she call for any questions, concerns, or issues. Voiced understanding  Barbette Merino, RN

## 2023-03-20 NOTE — Progress Notes (Signed)
   PATIENT NAME: Jeffrey Dennis DOB: [birth year] MRN: [id number]  PRIMARY CARE PROVIDER: Danella Penton, MD  RESPONSIBLE PARTY:  Acct ID - Guarantor Home Phone Work Phone Relationship Acct Type  [id number] - [place]* [phone]  Self P/F     [address] [place], [place] [address]      HISTORY OF PRESENT ILLNESS:  HISTORY OF PRESENT ILLNESS:  89 y.o. 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  He lost his wife last month. Went to [facility] from hospital on 03/07/23  and was d/c'd home from [facility] on 03/14/23. Pt has CHORE program thru [facility] (home care aide) for 10 hrs per week.   Cognitive: arrived to find patient sleeping in recliner. Pt had slid down in chair. Easily arousable. When awake, can say yes and no to questions and states, " I want to go. I am done."   Appetite: Caregiver, Gigi Gin and son in law, Amada Jupiter both report that pt has not had but a few bites of food since coming home last Friday (4 days)   Mobility: unable to stand and bear weight on legs. Unable to assist in pulling him back up in chair. Refuses to move to hospital bed. Lying in lift chair with feet elevated.   Sleeping Pattern: sleeping currently but easily arousable. Caregiver and son in law both report that he sleeps 95% of the time since he came home from [facility].  Pain: Denies any pain.  Gi/GU: Pt has indwelling catheter in place. Dk yellow/brown urine noted in tubing with sediment and solid particle is it. Very little in bag. Caregiver said she has not had to empty it at all on the shifts she is there. Son in law also reports that pt has not had a BM since coming home last Friday from SNF.  Palliative Care/ Hospice: RN explained role and purpose of palliative care including visit frequency. Also discussed benefits of  hospice care as well as the differences between the two with patient.  Both RN and SW had long conversation with son in Social worker, Amada Jupiter regarding pt being appropriate for hospice eval. SIL states, " we don't want to lose our home care hours every week." Spoke with SIL about weighing benefits of home care aide versus hospice team. SIL asked for hospice eval. and for [place] to call his wife at 1230 to update as she is at work. SW to call MD for hospice referral order. RN to call daughter at 51 as requested.   Goals of Care: when asked, pt only mumbles, he is "ready to go. I am done."  CODE STATUS: DNR ADVANCED DIRECTIVES: N MOST FORM: No PPS: 30%  Next appt scheduled: pending hospice eval.  PHYSICAL EXAM:   VITALS: Today's Vitals   03/20/23 1123  BP: 100/62  Pulse: 97  Resp: 16  SpO2: 92%  PainSc: 0-No pain     LUNGS: diminished bilaterally in all lobes CARDIAC:  Regular, no JVD EXTREMITIES: mild edema noted to feet and ankles. SKIN: what RN could assess was normal.    Barbette Merino, RN

## 2023-03-20 NOTE — Telephone Encounter (Signed)
TC to PCP to acquire hospice order.

## 2023-04-13 DEATH — deceased

## 2023-09-04 IMAGING — CR DG KNEE COMPLETE 4+V*L*
4 series · 4 of 4 positions shown · non-contrast
Comparison: None Available.

CLINICAL DATA: Knee pain

EXAM:
LEFT KNEE - COMPLETE 4+ VIEW

[knee ap]
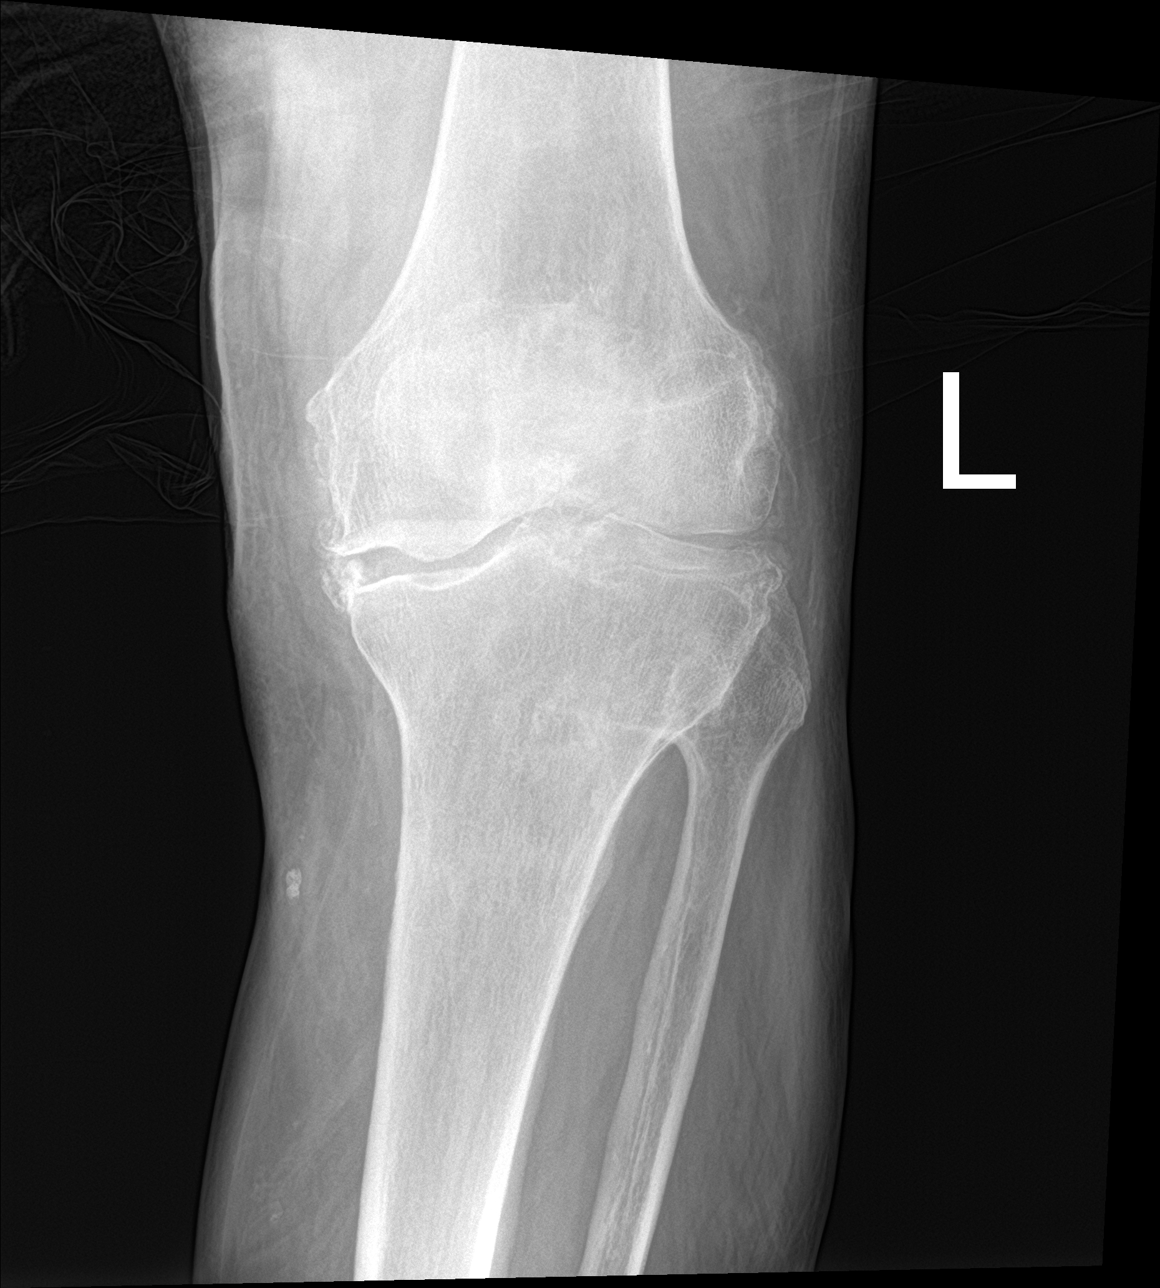

[knee obl (1 of 2)]
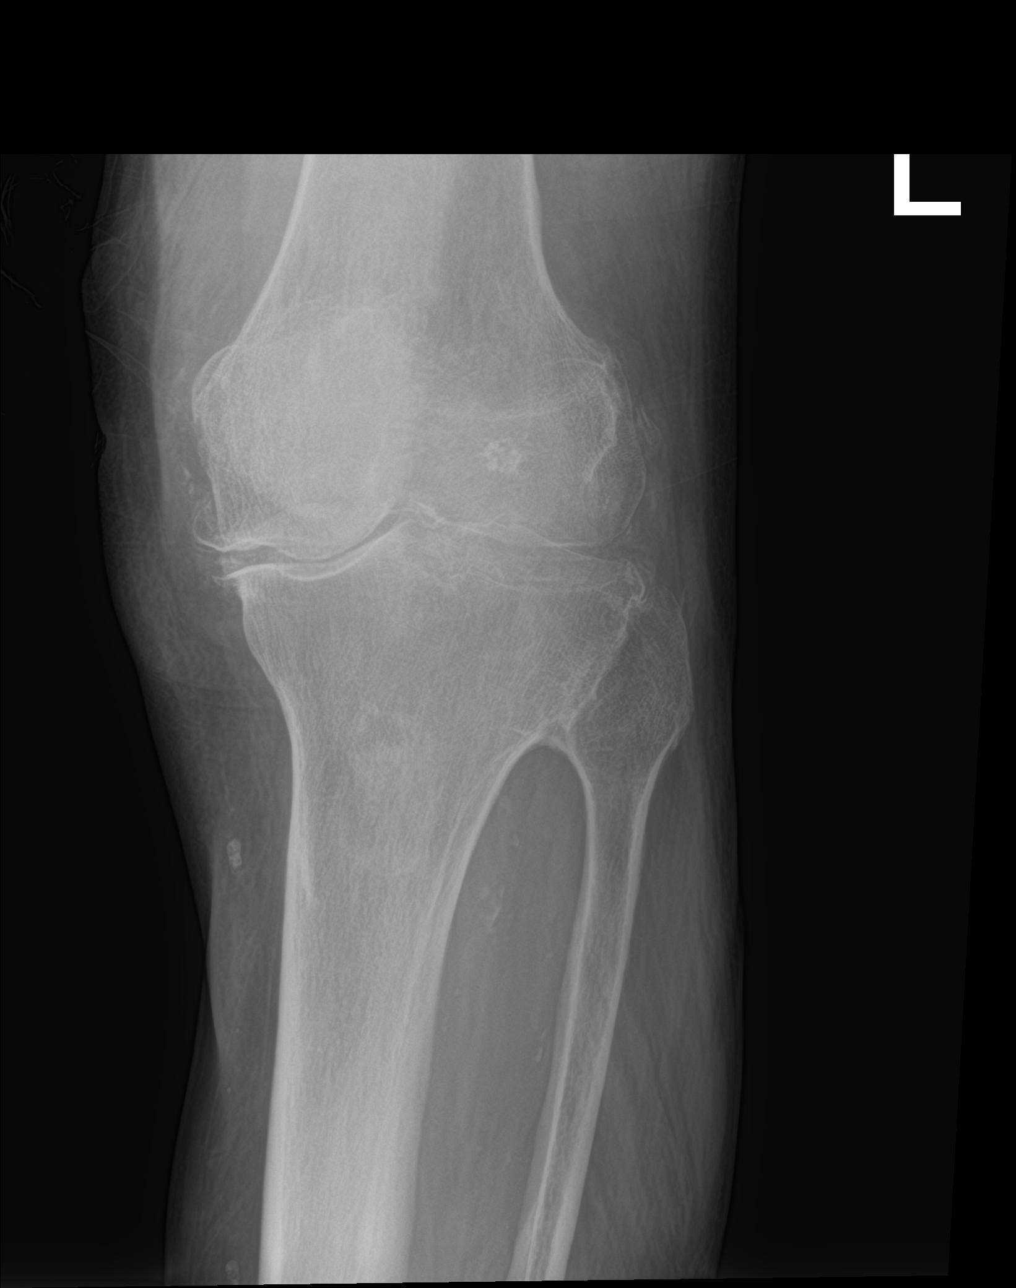

[knee obl (2 of 2)]
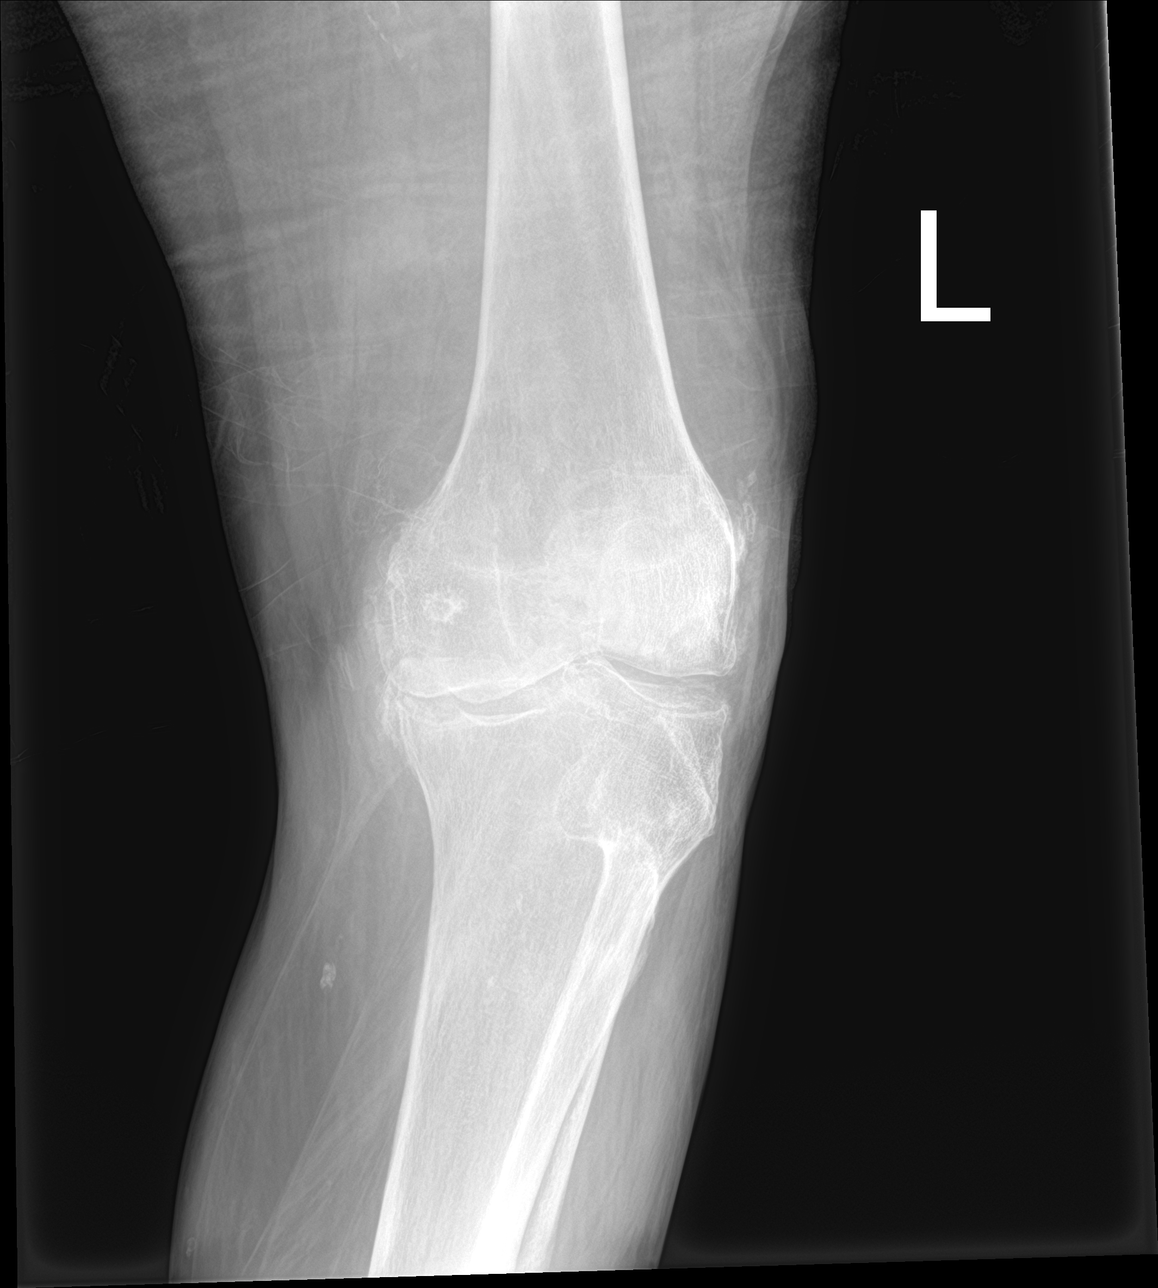

[knee lat]
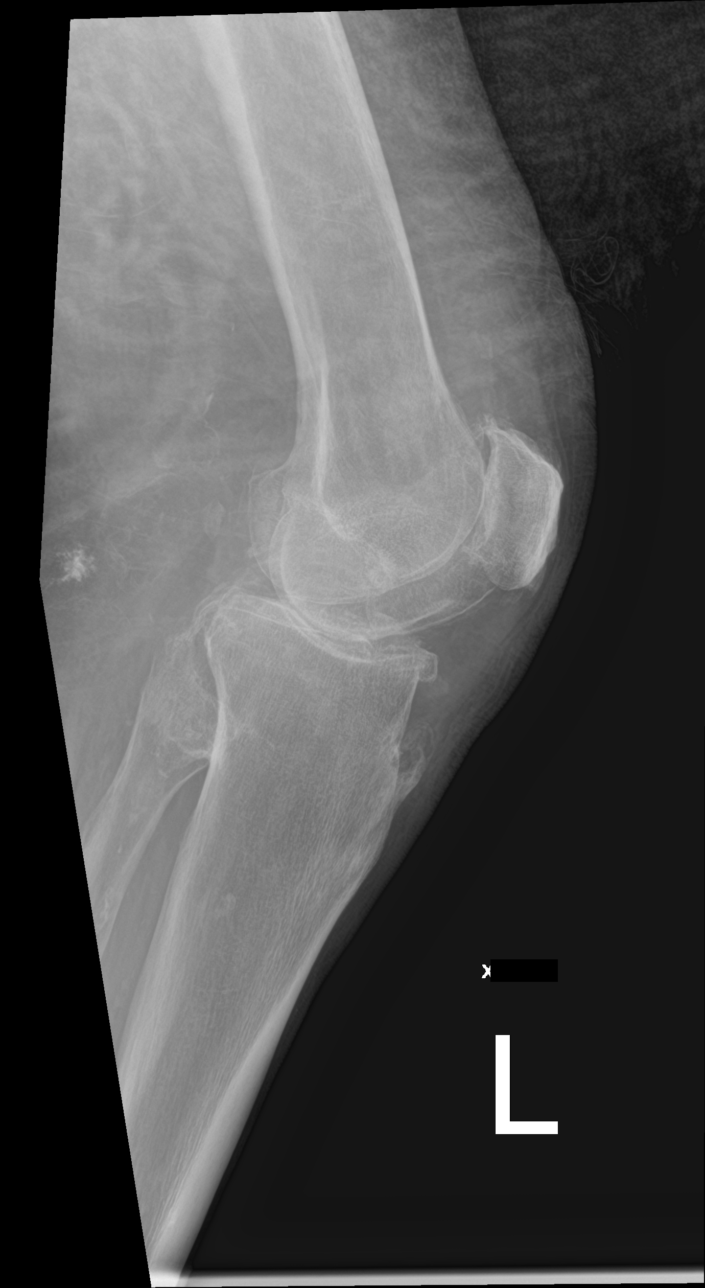

[4 of 4 positions shown; findings below may reference images not displayed]

FINDINGS: Negative for acute fracture.  Small joint effusion

Depression of the medial tibial plateau consistent with chronic
fracture. Advanced degenerative change in medial joint space with
chondrocalcinosis. Mild degenerative change in the lateral joint
space with chondrocalcinosis. Degenerative change patellofemoral
joint.
IMPRESSION: Negative for fracture.

Chondrocalcinosis and degenerative change in the knee most severe
medially.
